# Patient Record
Sex: Female | Born: 1940 | Race: White | Hispanic: No | Marital: Married | Smoking: Former smoker
Health system: Southern US, Community
[De-identification: ages and names within clinical notes are randomized; demographics above are authoritative.]

## PROBLEM LIST (undated history)

## (undated) DIAGNOSIS — T4145XA Adverse effect of unspecified anesthetic, initial encounter: Secondary | ICD-10-CM

## (undated) DIAGNOSIS — Z86010 Personal history of colonic polyps: Secondary | ICD-10-CM

## (undated) DIAGNOSIS — G4736 Sleep related hypoventilation in conditions classified elsewhere: Secondary | ICD-10-CM

## (undated) DIAGNOSIS — IMO0001 Reserved for inherently not codable concepts without codable children: Secondary | ICD-10-CM

## (undated) DIAGNOSIS — K219 Gastro-esophageal reflux disease without esophagitis: Secondary | ICD-10-CM

## (undated) DIAGNOSIS — Z5189 Encounter for other specified aftercare: Secondary | ICD-10-CM

## (undated) DIAGNOSIS — J989 Respiratory disorder, unspecified: Secondary | ICD-10-CM

## (undated) DIAGNOSIS — Z9981 Dependence on supplemental oxygen: Secondary | ICD-10-CM

## (undated) DIAGNOSIS — C801 Malignant (primary) neoplasm, unspecified: Secondary | ICD-10-CM

## (undated) DIAGNOSIS — R51 Headache: Secondary | ICD-10-CM

## (undated) DIAGNOSIS — T8859XA Other complications of anesthesia, initial encounter: Secondary | ICD-10-CM

## (undated) DIAGNOSIS — M419 Scoliosis, unspecified: Secondary | ICD-10-CM

## (undated) DIAGNOSIS — T849XXA Unspecified complication of internal orthopedic prosthetic device, implant and graft, initial encounter: Secondary | ICD-10-CM

## (undated) DIAGNOSIS — I10 Essential (primary) hypertension: Secondary | ICD-10-CM

## (undated) DIAGNOSIS — J189 Pneumonia, unspecified organism: Secondary | ICD-10-CM

## (undated) DIAGNOSIS — D563 Thalassemia minor: Secondary | ICD-10-CM

## (undated) DIAGNOSIS — Z9889 Other specified postprocedural states: Secondary | ICD-10-CM

## (undated) DIAGNOSIS — G4730 Sleep apnea, unspecified: Secondary | ICD-10-CM

## (undated) DIAGNOSIS — M1990 Unspecified osteoarthritis, unspecified site: Secondary | ICD-10-CM

## (undated) HISTORY — PX: DILATION AND CURETTAGE OF UTERUS: SHX78

## (undated) HISTORY — DX: Encounter for other specified aftercare: Z51.89

## (undated) HISTORY — DX: Essential (primary) hypertension: I10

## (undated) HISTORY — DX: Scoliosis, unspecified: M41.9

## (undated) HISTORY — DX: Dependence on supplemental oxygen: Z99.81

## (undated) HISTORY — DX: Unspecified osteoarthritis, unspecified site: M19.90

## (undated) HISTORY — DX: Personal history of colonic polyps: Z86.010

## (undated) HISTORY — DX: Respiratory disorder, unspecified: J98.9

## (undated) HISTORY — DX: Unspecified complication of internal orthopedic prosthetic device, implant and graft, initial encounter: T84.9XXA

## (undated) HISTORY — DX: Other specified postprocedural states: Z98.890

## (undated) HISTORY — DX: Gastro-esophageal reflux disease without esophagitis: K21.9

## (undated) HISTORY — DX: Sleep related hypoventilation in conditions classified elsewhere: G47.36

## (undated) HISTORY — DX: Sleep apnea, unspecified: G47.30

## (undated) HISTORY — DX: Malignant (primary) neoplasm, unspecified: C80.1

## (undated) HISTORY — DX: Reserved for inherently not codable concepts without codable children: IMO0001

## (undated) HISTORY — DX: Thalassemia minor: D56.3

---

## 1985-08-09 HISTORY — PX: OTHER SURGICAL HISTORY: SHX169

## 1993-08-09 HISTORY — PX: BREAST BIOPSY: SHX20

## 1998-06-24 ENCOUNTER — Other Ambulatory Visit: Admission: RE | Admit: 1998-06-24 | Discharge: 1998-06-24 | Payer: Self-pay | Admitting: Gynecology

## 1998-06-25 ENCOUNTER — Other Ambulatory Visit: Admission: RE | Admit: 1998-06-25 | Discharge: 1998-06-25 | Payer: Self-pay | Admitting: Obstetrics & Gynecology

## 1998-08-29 ENCOUNTER — Ambulatory Visit (HOSPITAL_COMMUNITY): Admission: RE | Admit: 1998-08-29 | Discharge: 1998-08-29 | Payer: Self-pay | Admitting: Obstetrics & Gynecology

## 1999-07-08 ENCOUNTER — Other Ambulatory Visit: Admission: RE | Admit: 1999-07-08 | Discharge: 1999-07-08 | Payer: Self-pay | Admitting: Obstetrics & Gynecology

## 2000-07-18 ENCOUNTER — Other Ambulatory Visit: Admission: RE | Admit: 2000-07-18 | Discharge: 2000-07-18 | Payer: Self-pay | Admitting: Obstetrics & Gynecology

## 2001-06-09 ENCOUNTER — Encounter: Payer: Self-pay | Admitting: Obstetrics & Gynecology

## 2001-06-09 ENCOUNTER — Encounter: Admission: RE | Admit: 2001-06-09 | Discharge: 2001-06-09 | Payer: Self-pay | Admitting: Obstetrics & Gynecology

## 2001-12-18 ENCOUNTER — Other Ambulatory Visit: Admission: RE | Admit: 2001-12-18 | Discharge: 2001-12-18 | Payer: Self-pay | Admitting: Obstetrics & Gynecology

## 2002-03-07 ENCOUNTER — Encounter: Payer: Self-pay | Admitting: Obstetrics & Gynecology

## 2002-03-07 ENCOUNTER — Encounter: Admission: RE | Admit: 2002-03-07 | Discharge: 2002-03-07 | Payer: Self-pay | Admitting: Obstetrics & Gynecology

## 2002-10-18 ENCOUNTER — Encounter: Payer: Self-pay | Admitting: Obstetrics & Gynecology

## 2002-10-18 ENCOUNTER — Encounter: Admission: RE | Admit: 2002-10-18 | Discharge: 2002-10-18 | Payer: Self-pay | Admitting: Obstetrics & Gynecology

## 2002-10-19 ENCOUNTER — Encounter: Payer: Self-pay | Admitting: Obstetrics & Gynecology

## 2002-10-19 ENCOUNTER — Encounter: Admission: RE | Admit: 2002-10-19 | Discharge: 2002-10-19 | Payer: Self-pay | Admitting: Obstetrics & Gynecology

## 2003-03-13 ENCOUNTER — Other Ambulatory Visit: Admission: RE | Admit: 2003-03-13 | Discharge: 2003-03-13 | Payer: Self-pay | Admitting: Obstetrics & Gynecology

## 2004-01-07 ENCOUNTER — Encounter: Admission: RE | Admit: 2004-01-07 | Discharge: 2004-01-07 | Payer: Self-pay | Admitting: Obstetrics & Gynecology

## 2004-06-09 ENCOUNTER — Ambulatory Visit: Payer: Self-pay | Admitting: Internal Medicine

## 2004-06-23 ENCOUNTER — Ambulatory Visit: Payer: Self-pay | Admitting: Internal Medicine

## 2004-06-25 ENCOUNTER — Ambulatory Visit: Payer: Self-pay | Admitting: Family Medicine

## 2004-08-09 HISTORY — PX: CATARACT EXTRACTION, BILATERAL: SHX1313

## 2004-09-14 ENCOUNTER — Other Ambulatory Visit: Admission: RE | Admit: 2004-09-14 | Discharge: 2004-09-14 | Payer: Self-pay | Admitting: Obstetrics & Gynecology

## 2004-10-16 ENCOUNTER — Ambulatory Visit: Payer: Self-pay | Admitting: Family Medicine

## 2004-11-10 ENCOUNTER — Ambulatory Visit: Payer: Self-pay | Admitting: Family Medicine

## 2004-11-17 ENCOUNTER — Ambulatory Visit: Payer: Self-pay | Admitting: Family Medicine

## 2004-12-04 ENCOUNTER — Ambulatory Visit: Payer: Self-pay | Admitting: Family Medicine

## 2005-03-04 ENCOUNTER — Encounter: Admission: RE | Admit: 2005-03-04 | Discharge: 2005-03-04 | Payer: Self-pay | Admitting: Obstetrics & Gynecology

## 2005-04-23 ENCOUNTER — Ambulatory Visit: Payer: Self-pay | Admitting: Family Medicine

## 2005-06-12 ENCOUNTER — Ambulatory Visit: Payer: Self-pay | Admitting: Family Medicine

## 2005-07-30 ENCOUNTER — Ambulatory Visit: Payer: Self-pay | Admitting: Internal Medicine

## 2005-08-09 DIAGNOSIS — C801 Malignant (primary) neoplasm, unspecified: Secondary | ICD-10-CM

## 2005-08-09 HISTORY — DX: Malignant (primary) neoplasm, unspecified: C80.1

## 2005-08-09 HISTORY — PX: MASTECTOMY: SHX3

## 2005-11-04 ENCOUNTER — Encounter: Admission: RE | Admit: 2005-11-04 | Discharge: 2005-11-04 | Payer: Self-pay | Admitting: Obstetrics & Gynecology

## 2005-11-08 ENCOUNTER — Encounter (INDEPENDENT_AMBULATORY_CARE_PROVIDER_SITE_OTHER): Payer: Self-pay | Admitting: *Deleted

## 2005-11-08 ENCOUNTER — Encounter (INDEPENDENT_AMBULATORY_CARE_PROVIDER_SITE_OTHER): Payer: Self-pay | Admitting: Diagnostic Radiology

## 2005-11-08 ENCOUNTER — Encounter: Admission: RE | Admit: 2005-11-08 | Discharge: 2005-11-08 | Payer: Self-pay | Admitting: Obstetrics & Gynecology

## 2005-11-15 ENCOUNTER — Encounter: Admission: RE | Admit: 2005-11-15 | Discharge: 2005-11-15 | Payer: Self-pay | Admitting: Surgery

## 2005-11-16 ENCOUNTER — Encounter (INDEPENDENT_AMBULATORY_CARE_PROVIDER_SITE_OTHER): Payer: Self-pay | Admitting: Specialist

## 2005-11-16 ENCOUNTER — Encounter: Admission: RE | Admit: 2005-11-16 | Discharge: 2005-11-16 | Payer: Self-pay | Admitting: Obstetrics & Gynecology

## 2005-11-16 ENCOUNTER — Encounter (INDEPENDENT_AMBULATORY_CARE_PROVIDER_SITE_OTHER): Payer: Self-pay | Admitting: Diagnostic Radiology

## 2005-11-29 ENCOUNTER — Encounter (INDEPENDENT_AMBULATORY_CARE_PROVIDER_SITE_OTHER): Payer: Self-pay | Admitting: Surgery

## 2005-11-29 ENCOUNTER — Encounter (INDEPENDENT_AMBULATORY_CARE_PROVIDER_SITE_OTHER): Payer: Self-pay | Admitting: *Deleted

## 2005-11-29 ENCOUNTER — Inpatient Hospital Stay (HOSPITAL_COMMUNITY): Admission: RE | Admit: 2005-11-29 | Discharge: 2005-12-01 | Payer: Self-pay | Admitting: Surgery

## 2005-12-23 ENCOUNTER — Ambulatory Visit: Payer: Self-pay | Admitting: Oncology

## 2005-12-29 LAB — COMPREHENSIVE METABOLIC PANEL
ALT: 8 U/L (ref 0–40)
AST: 11 U/L (ref 0–37)
Albumin: 4.1 g/dL (ref 3.5–5.2)
Alkaline Phosphatase: 77 U/L (ref 39–117)
BUN: 20 mg/dL (ref 6–23)
Calcium: 9.5 mg/dL (ref 8.4–10.5)
Chloride: 106 mEq/L (ref 96–112)
Potassium: 3.9 mEq/L (ref 3.5–5.3)
Sodium: 141 mEq/L (ref 135–145)
Total Protein: 6.7 g/dL (ref 6.0–8.3)

## 2005-12-29 LAB — CBC WITH DIFFERENTIAL/PLATELET
Eosinophils Absolute: 0.3 10*3/uL (ref 0.0–0.5)
HCT: 29.1 % — ABNORMAL LOW (ref 34.8–46.6)
LYMPH%: 31.3 % (ref 14.0–48.0)
MCV: 64.9 fL — ABNORMAL LOW (ref 81.0–101.0)
MONO#: 0.6 10*3/uL (ref 0.1–0.9)
MONO%: 8.9 % (ref 0.0–13.0)
NEUT#: 3.8 10*3/uL (ref 1.5–6.5)
NEUT%: 54.9 % (ref 39.6–76.8)
Platelets: 377 10*3/uL (ref 145–400)
WBC: 6.9 10*3/uL (ref 3.9–10.0)

## 2006-03-23 ENCOUNTER — Ambulatory Visit: Payer: Self-pay | Admitting: Oncology

## 2006-03-25 LAB — CBC WITH DIFFERENTIAL/PLATELET
BASO%: 0.9 % (ref 0.0–2.0)
Basophils Absolute: 0.1 10*3/uL (ref 0.0–0.1)
EOS%: 4.7 % (ref 0.0–7.0)
MCH: 19.1 pg — ABNORMAL LOW (ref 26.0–34.0)
MCHC: 31.1 g/dL — ABNORMAL LOW (ref 32.0–36.0)
MCV: 61.5 fL — ABNORMAL LOW (ref 81.0–101.0)
MONO%: 5.4 % (ref 0.0–13.0)
RDW: 19.5 % — ABNORMAL HIGH (ref 11.3–14.5)
lymph#: 2 10*3/uL (ref 0.9–3.3)

## 2006-03-25 LAB — COMPREHENSIVE METABOLIC PANEL
ALT: 19 U/L (ref 0–40)
AST: 17 U/L (ref 0–37)
Albumin: 4.5 g/dL (ref 3.5–5.2)
Alkaline Phosphatase: 90 U/L (ref 39–117)
BUN: 18 mg/dL (ref 6–23)
Calcium: 10.2 mg/dL (ref 8.4–10.5)
Chloride: 104 mEq/L (ref 96–112)
Creatinine, Ser: 1.1 mg/dL (ref 0.40–1.20)
Potassium: 3.8 mEq/L (ref 3.5–5.3)

## 2006-06-14 ENCOUNTER — Ambulatory Visit: Payer: Self-pay | Admitting: Family Medicine

## 2006-06-24 ENCOUNTER — Ambulatory Visit: Payer: Self-pay | Admitting: Internal Medicine

## 2006-06-29 ENCOUNTER — Ambulatory Visit: Payer: Self-pay | Admitting: Family Medicine

## 2006-07-04 ENCOUNTER — Ambulatory Visit: Payer: Self-pay | Admitting: Family Medicine

## 2006-08-05 ENCOUNTER — Ambulatory Visit: Payer: Self-pay | Admitting: Family Medicine

## 2006-08-08 ENCOUNTER — Ambulatory Visit: Payer: Self-pay | Admitting: Oncology

## 2006-08-12 LAB — CBC WITH DIFFERENTIAL/PLATELET
EOS%: 2.2 % (ref 0.0–7.0)
Eosinophils Absolute: 0.2 10*3/uL (ref 0.0–0.5)
LYMPH%: 18.7 % (ref 14.0–48.0)
MCH: 20.9 pg — ABNORMAL LOW (ref 26.0–34.0)
MCV: 67.2 fL — ABNORMAL LOW (ref 81.0–101.0)
MONO%: 6.9 % (ref 0.0–13.0)
NEUT#: 6.6 10*3/uL — ABNORMAL HIGH (ref 1.5–6.5)
Platelets: 332 10*3/uL (ref 145–400)
RBC: 5.99 10*6/uL — ABNORMAL HIGH (ref 3.70–5.32)
RDW: 15.7 % — ABNORMAL HIGH (ref 11.3–14.5)

## 2006-08-12 LAB — COMPREHENSIVE METABOLIC PANEL
AST: 14 U/L (ref 0–37)
Alkaline Phosphatase: 93 U/L (ref 39–117)
BUN: 14 mg/dL (ref 6–23)
Glucose, Bld: 109 mg/dL — ABNORMAL HIGH (ref 70–99)
Potassium: 3.5 mEq/L (ref 3.5–5.3)
Sodium: 140 mEq/L (ref 135–145)
Total Bilirubin: 0.6 mg/dL (ref 0.3–1.2)
Total Protein: 7 g/dL (ref 6.0–8.3)

## 2006-09-01 ENCOUNTER — Ambulatory Visit: Payer: Self-pay | Admitting: Family Medicine

## 2006-09-01 LAB — CONVERTED CEMR LAB
Cholesterol: 208 mg/dL (ref 0–200)
Direct LDL: 147 mg/dL

## 2006-09-20 ENCOUNTER — Ambulatory Visit: Payer: Self-pay | Admitting: Family Medicine

## 2006-11-21 ENCOUNTER — Encounter: Admission: RE | Admit: 2006-11-21 | Discharge: 2006-11-21 | Payer: Self-pay | Admitting: Oncology

## 2006-12-06 ENCOUNTER — Ambulatory Visit: Payer: Self-pay | Admitting: Family Medicine

## 2007-02-21 ENCOUNTER — Ambulatory Visit: Payer: Self-pay | Admitting: Oncology

## 2007-02-23 LAB — CBC WITH DIFFERENTIAL/PLATELET
Basophils Absolute: 0.1 10*3/uL (ref 0.0–0.1)
EOS%: 4.1 % (ref 0.0–7.0)
HGB: 11.7 g/dL (ref 11.6–15.9)
MCH: 20.9 pg — ABNORMAL LOW (ref 26.0–34.0)
MCHC: 32.3 g/dL (ref 32.0–36.0)
MCV: 64.7 fL — ABNORMAL LOW (ref 81.0–101.0)
MONO%: 7.3 % (ref 0.0–13.0)
NEUT%: 62.4 % (ref 39.6–76.8)
RDW: 15.1 % — ABNORMAL HIGH (ref 11.3–14.5)

## 2007-02-23 LAB — COMPREHENSIVE METABOLIC PANEL
AST: 15 U/L (ref 0–37)
Alkaline Phosphatase: 73 U/L (ref 39–117)
BUN: 22 mg/dL (ref 6–23)
Creatinine, Ser: 1.12 mg/dL (ref 0.40–1.20)
Potassium: 4 mEq/L (ref 3.5–5.3)

## 2007-03-02 ENCOUNTER — Ambulatory Visit (HOSPITAL_COMMUNITY): Admission: RE | Admit: 2007-03-02 | Discharge: 2007-03-02 | Payer: Self-pay | Admitting: Oncology

## 2007-05-15 DIAGNOSIS — M76829 Posterior tibial tendinitis, unspecified leg: Secondary | ICD-10-CM

## 2007-05-25 ENCOUNTER — Ambulatory Visit: Payer: Self-pay | Admitting: Family Medicine

## 2007-08-15 ENCOUNTER — Telehealth: Payer: Self-pay | Admitting: Family Medicine

## 2007-08-17 ENCOUNTER — Ambulatory Visit: Payer: Self-pay | Admitting: Family Medicine

## 2007-08-17 DIAGNOSIS — E0590 Thyrotoxicosis, unspecified without thyrotoxic crisis or storm: Secondary | ICD-10-CM | POA: Insufficient documentation

## 2007-08-17 DIAGNOSIS — Z860100 Personal history of colon polyps, unspecified: Secondary | ICD-10-CM

## 2007-08-17 DIAGNOSIS — I10 Essential (primary) hypertension: Secondary | ICD-10-CM | POA: Insufficient documentation

## 2007-08-17 DIAGNOSIS — F329 Major depressive disorder, single episode, unspecified: Secondary | ICD-10-CM

## 2007-08-17 DIAGNOSIS — J45909 Unspecified asthma, uncomplicated: Secondary | ICD-10-CM | POA: Insufficient documentation

## 2007-08-17 DIAGNOSIS — J309 Allergic rhinitis, unspecified: Secondary | ICD-10-CM | POA: Insufficient documentation

## 2007-08-17 DIAGNOSIS — K219 Gastro-esophageal reflux disease without esophagitis: Secondary | ICD-10-CM

## 2007-08-17 HISTORY — DX: Personal history of colonic polyps: Z86.010

## 2007-08-17 HISTORY — DX: Personal history of colon polyps, unspecified: Z86.0100

## 2007-08-21 ENCOUNTER — Encounter: Payer: Self-pay | Admitting: Family Medicine

## 2007-08-22 ENCOUNTER — Ambulatory Visit: Payer: Self-pay | Admitting: Oncology

## 2007-08-24 LAB — CBC WITH DIFFERENTIAL/PLATELET
Basophils Absolute: 0 10*3/uL (ref 0.0–0.1)
Eosinophils Absolute: 0.1 10*3/uL (ref 0.0–0.5)
HGB: 12.8 g/dL (ref 11.6–15.9)
LYMPH%: 8.4 % — ABNORMAL LOW (ref 14.0–48.0)
MCV: 65.8 fL — ABNORMAL LOW (ref 81.0–101.0)
MONO%: 1.6 % (ref 0.0–13.0)
NEUT#: 13.8 10*3/uL — ABNORMAL HIGH (ref 1.5–6.5)
Platelets: 335 10*3/uL (ref 145–400)

## 2007-08-24 LAB — COMPREHENSIVE METABOLIC PANEL
Albumin: 4.4 g/dL (ref 3.5–5.2)
Alkaline Phosphatase: 64 U/L (ref 39–117)
BUN: 26 mg/dL — ABNORMAL HIGH (ref 6–23)
Glucose, Bld: 153 mg/dL — ABNORMAL HIGH (ref 70–99)
Total Bilirubin: 0.5 mg/dL (ref 0.3–1.2)

## 2007-09-01 LAB — VITAMIN D PNL(25-HYDRXY+1,25-DIHY)-BLD: Vit D, 25-Hydroxy: 31 ng/mL (ref 30–89)

## 2007-10-20 ENCOUNTER — Ambulatory Visit: Payer: Self-pay | Admitting: Oncology

## 2007-11-02 ENCOUNTER — Ambulatory Visit: Payer: Self-pay | Admitting: Internal Medicine

## 2007-11-13 ENCOUNTER — Encounter: Payer: Self-pay | Admitting: Internal Medicine

## 2007-11-13 ENCOUNTER — Ambulatory Visit: Payer: Self-pay | Admitting: Internal Medicine

## 2007-11-13 ENCOUNTER — Encounter: Payer: Self-pay | Admitting: Family Medicine

## 2007-11-23 ENCOUNTER — Encounter: Admission: RE | Admit: 2007-11-23 | Discharge: 2007-11-23 | Payer: Self-pay | Admitting: Oncology

## 2008-04-26 ENCOUNTER — Telehealth: Payer: Self-pay | Admitting: Family Medicine

## 2008-05-10 ENCOUNTER — Ambulatory Visit: Payer: Self-pay | Admitting: Oncology

## 2008-05-14 LAB — CBC WITH DIFFERENTIAL/PLATELET
BASO%: 1.2 % (ref 0.0–2.0)
EOS%: 3.6 % (ref 0.0–7.0)
LYMPH%: 26.5 % (ref 14.0–48.0)
MCH: 20.8 pg — ABNORMAL LOW (ref 26.0–34.0)
MCHC: 31.8 g/dL — ABNORMAL LOW (ref 32.0–36.0)
MONO#: 0.4 10*3/uL (ref 0.1–0.9)
Platelets: 253 10*3/uL (ref 145–400)
RBC: 6.07 10*6/uL — ABNORMAL HIGH (ref 3.70–5.32)
WBC: 9 10*3/uL (ref 3.9–10.0)

## 2008-05-15 LAB — CANCER ANTIGEN 27.29: CA 27.29: 18 U/mL (ref 0–39)

## 2008-05-20 ENCOUNTER — Encounter: Payer: Self-pay | Admitting: Internal Medicine

## 2008-05-20 ENCOUNTER — Encounter: Payer: Self-pay | Admitting: Cardiology

## 2008-05-23 ENCOUNTER — Ambulatory Visit: Payer: Self-pay | Admitting: Family Medicine

## 2008-08-06 ENCOUNTER — Ambulatory Visit: Payer: Self-pay | Admitting: Family Medicine

## 2008-08-15 ENCOUNTER — Ambulatory Visit: Payer: Self-pay | Admitting: Oncology

## 2008-08-19 LAB — BASIC METABOLIC PANEL
BUN: 17 mg/dL (ref 6–23)
Calcium: 10.4 mg/dL (ref 8.4–10.5)
Glucose, Bld: 110 mg/dL — ABNORMAL HIGH (ref 70–99)
Potassium: 4.1 mEq/L (ref 3.5–5.3)

## 2008-09-17 ENCOUNTER — Ambulatory Visit: Payer: Self-pay | Admitting: Family Medicine

## 2008-09-17 DIAGNOSIS — G609 Hereditary and idiopathic neuropathy, unspecified: Secondary | ICD-10-CM | POA: Insufficient documentation

## 2008-10-01 ENCOUNTER — Ambulatory Visit: Payer: Self-pay | Admitting: Family Medicine

## 2008-12-25 ENCOUNTER — Encounter: Admission: RE | Admit: 2008-12-25 | Discharge: 2008-12-25 | Payer: Self-pay | Admitting: Oncology

## 2009-01-11 IMAGING — CR DG THORACIC SPINE 2V
3 series · 3 of 3 positions shown · non-contrast
Comparison: Chest radiograph 11/25/05.

CLINICAL DATA: 66-year-old female evaluate for compression fracture.  Patient has pain throughout the mid portion of her back extending to both ribs. 
 THORACIC SPINE ? 2 VIEW:

[t t-spine lat (1 of 3)]
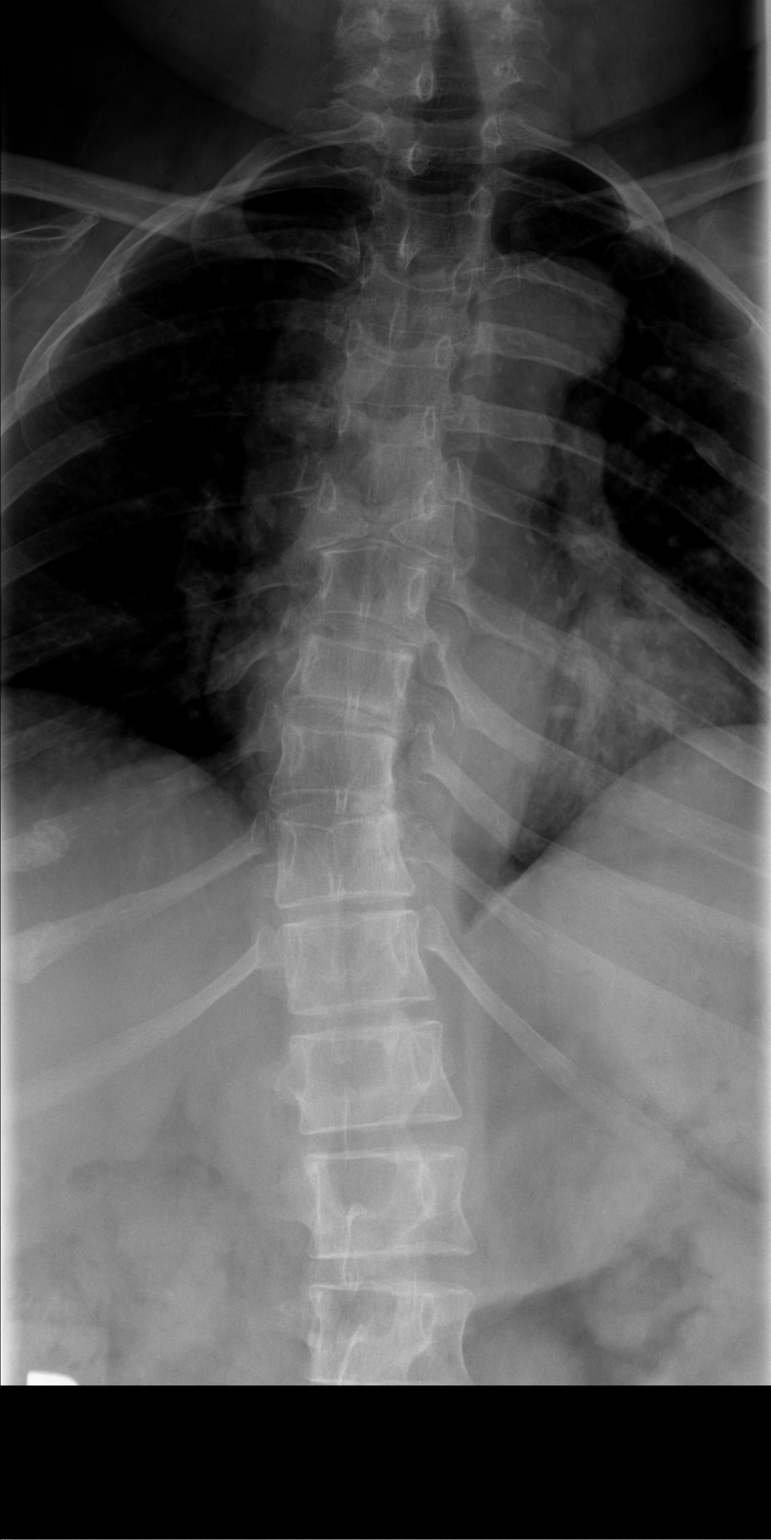

[t t-spine lat (2 of 3)]
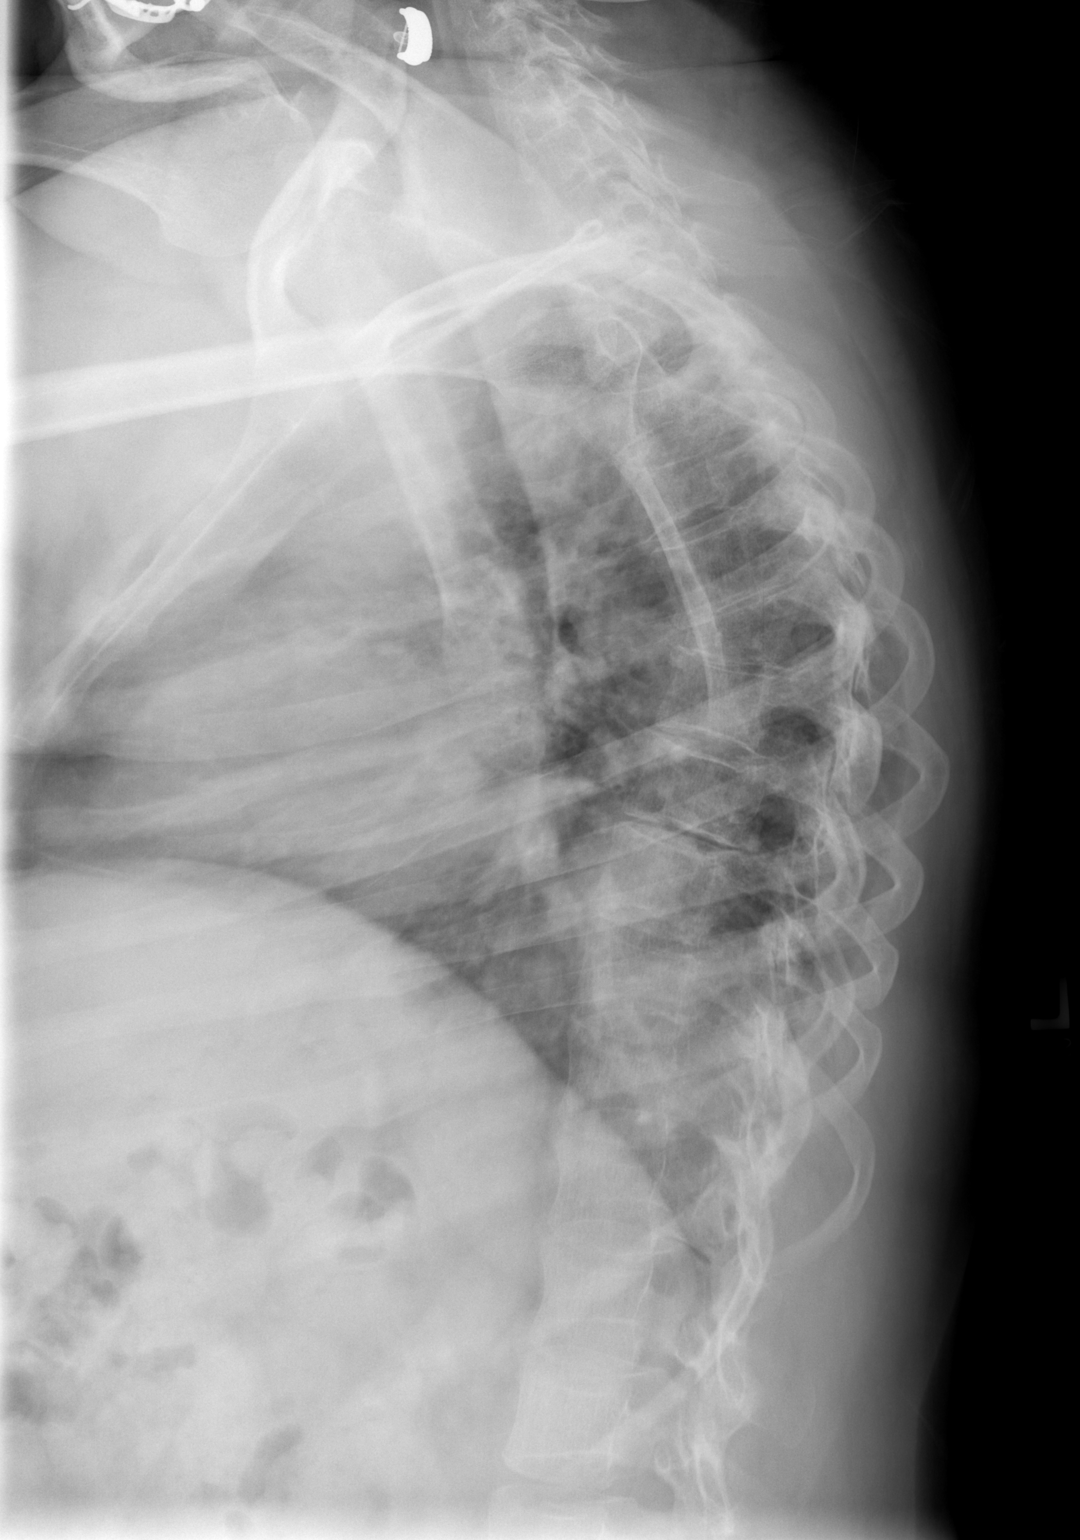

[t t-spine lat (3 of 3)]
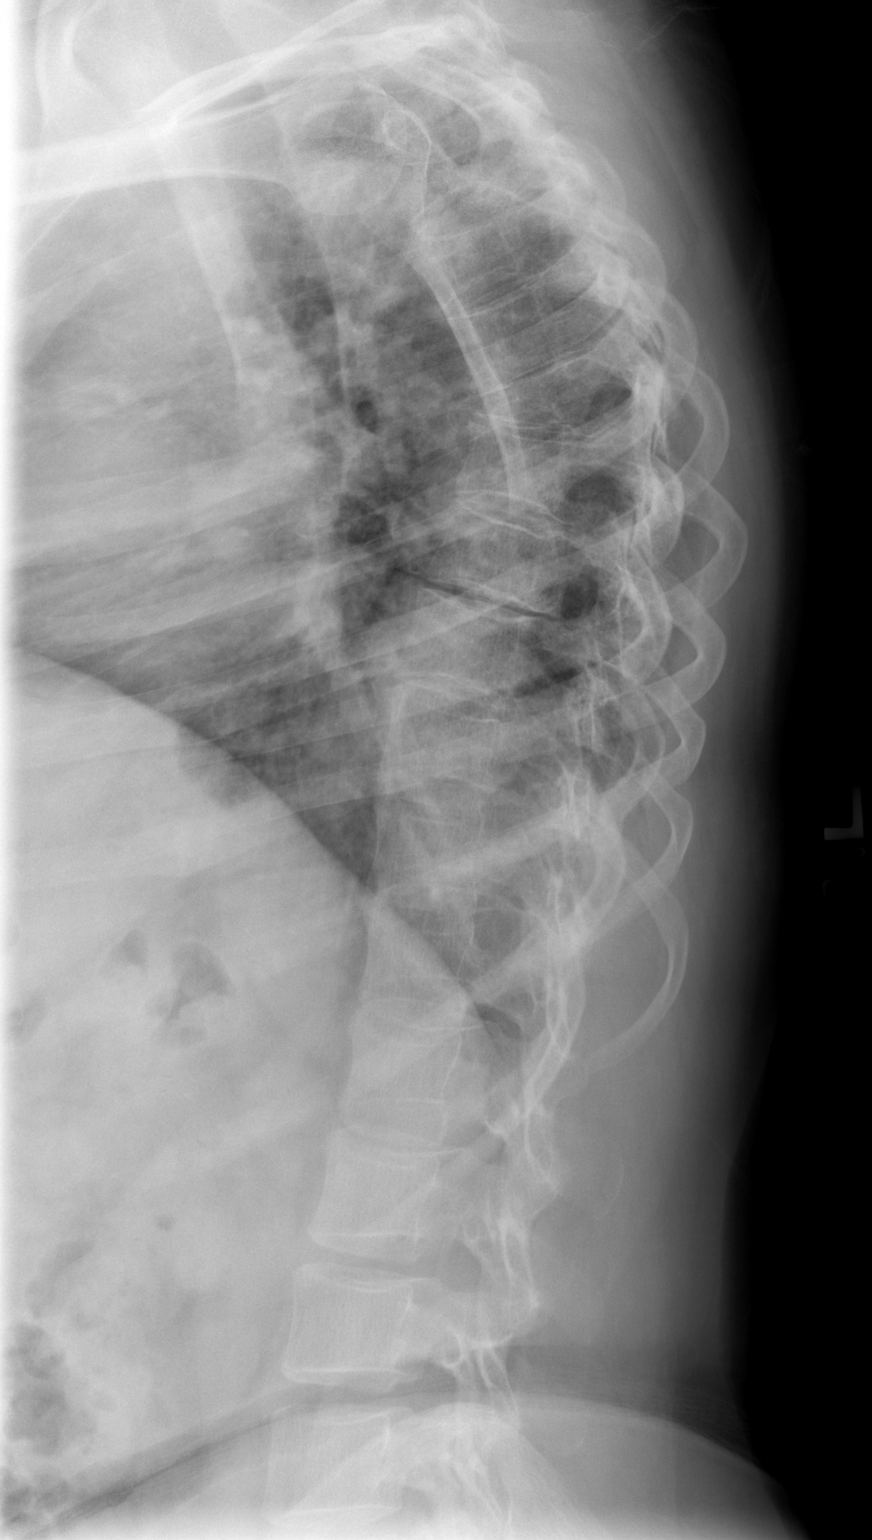

[3 of 3 positions shown; findings below may reference images not displayed]

FINDINGS: Eleven rib-bearing vertebral bodies are present.  There is a butterfly anomaly at T6.  There is rightward curvature apex at T9.  Compensatory curvature is seen in the upper lumbar spine.  Sclerotic lesions are seen in the right posterior ninth and tenth ribs compatible with healing fractures.  These could represent metastases.  There is a healed fracture in the posterior left third rib.  Mild sclerosis is seen along the concave aspect of the curvature at T9-10.  No new fractures are seen.
IMPRESSION: 1.  Segmented vertebral body (butterfly vertebral body) T6 with exaggerated kyphosis similar to the prior chest radiograph. 
 2.  Rightward curvature of the thoracic spine centered at T9-10.  
 3.  Sclerotic lesions suggesting healing fractures in the posterior right ninth and tenth ribs.  
 4.  Healed left posterior third rib fracture. 
 5.  Metastases are not excluded.

## 2009-03-01 ENCOUNTER — Encounter: Payer: Self-pay | Admitting: Cardiology

## 2009-03-13 ENCOUNTER — Ambulatory Visit: Payer: Self-pay | Admitting: Oncology

## 2009-03-13 LAB — CBC WITH DIFFERENTIAL/PLATELET
BASO%: 0.3 % (ref 0.0–2.0)
HCT: 41.7 % (ref 34.8–46.6)
LYMPH%: 12.4 % — ABNORMAL LOW (ref 14.0–49.7)
MCHC: 31.5 g/dL (ref 31.5–36.0)
MONO#: 0.2 10*3/uL (ref 0.1–0.9)
NEUT%: 84.6 % — ABNORMAL HIGH (ref 38.4–76.8)
Platelets: 270 10*3/uL (ref 145–400)
WBC: 15.1 10*3/uL — ABNORMAL HIGH (ref 3.9–10.3)

## 2009-03-13 LAB — COMPREHENSIVE METABOLIC PANEL
ALT: 13 U/L (ref 0–35)
CO2: 27 mEq/L (ref 19–32)
Creatinine, Ser: 1.25 mg/dL — ABNORMAL HIGH (ref 0.40–1.20)
Glucose, Bld: 140 mg/dL — ABNORMAL HIGH (ref 70–99)
Total Bilirubin: 0.5 mg/dL (ref 0.3–1.2)

## 2009-03-20 ENCOUNTER — Encounter: Payer: Self-pay | Admitting: Cardiology

## 2009-03-27 ENCOUNTER — Telehealth: Payer: Self-pay | Admitting: Family Medicine

## 2009-04-02 ENCOUNTER — Ambulatory Visit: Payer: Self-pay | Admitting: Cardiology

## 2009-04-21 ENCOUNTER — Telehealth (INDEPENDENT_AMBULATORY_CARE_PROVIDER_SITE_OTHER): Payer: Self-pay | Admitting: Radiology

## 2009-04-22 ENCOUNTER — Ambulatory Visit: Payer: Self-pay

## 2009-04-22 ENCOUNTER — Encounter: Payer: Self-pay | Admitting: Cardiology

## 2009-04-24 ENCOUNTER — Encounter (INDEPENDENT_AMBULATORY_CARE_PROVIDER_SITE_OTHER): Payer: Self-pay | Admitting: *Deleted

## 2009-04-24 ENCOUNTER — Ambulatory Visit: Payer: Self-pay | Admitting: Cardiology

## 2009-04-24 DIAGNOSIS — R0789 Other chest pain: Secondary | ICD-10-CM

## 2009-04-25 LAB — CONVERTED CEMR LAB
BUN: 20 mg/dL (ref 6–23)
Basophils Relative: 0.8 % (ref 0.0–3.0)
CO2: 30 meq/L (ref 19–32)
Chloride: 109 meq/L (ref 96–112)
Eosinophils Absolute: 0.6 10*3/uL (ref 0.0–0.7)
Eosinophils Relative: 6.4 % — ABNORMAL HIGH (ref 0.0–5.0)
Lymphocytes Relative: 22.8 % (ref 12.0–46.0)
Lymphs Abs: 2 10*3/uL (ref 0.7–4.0)
Monocytes Absolute: 0.6 10*3/uL (ref 0.1–1.0)
Platelets: 265 10*3/uL (ref 150.0–400.0)
Potassium: 4.1 meq/L (ref 3.5–5.1)
RBC: 5.71 M/uL — ABNORMAL HIGH (ref 3.87–5.11)
RDW: 14.4 % (ref 11.5–14.6)

## 2009-04-29 ENCOUNTER — Ambulatory Visit: Payer: Self-pay | Admitting: Cardiovascular Disease

## 2009-04-29 ENCOUNTER — Inpatient Hospital Stay (HOSPITAL_BASED_OUTPATIENT_CLINIC_OR_DEPARTMENT_OTHER): Admission: RE | Admit: 2009-04-29 | Discharge: 2009-04-29 | Payer: Self-pay | Admitting: Cardiovascular Disease

## 2009-05-05 ENCOUNTER — Telehealth: Payer: Self-pay | Admitting: Family Medicine

## 2009-05-18 ENCOUNTER — Encounter
Admission: RE | Admit: 2009-05-18 | Discharge: 2009-05-18 | Payer: Self-pay | Admitting: Physical Medicine and Rehabilitation

## 2009-05-29 ENCOUNTER — Encounter (INDEPENDENT_AMBULATORY_CARE_PROVIDER_SITE_OTHER): Payer: Self-pay | Admitting: *Deleted

## 2009-06-06 ENCOUNTER — Ambulatory Visit: Payer: Self-pay | Admitting: Cardiology

## 2009-08-09 HISTORY — PX: FEMUR FRACTURE SURGERY: SHX633

## 2009-09-17 ENCOUNTER — Ambulatory Visit: Payer: Self-pay | Admitting: Oncology

## 2009-09-19 LAB — COMPREHENSIVE METABOLIC PANEL
AST: 18 U/L (ref 0–37)
Alkaline Phosphatase: 70 U/L (ref 39–117)
BUN: 17 mg/dL (ref 6–23)
Creatinine, Ser: 1.06 mg/dL (ref 0.40–1.20)
Potassium: 3.5 mEq/L (ref 3.5–5.3)

## 2009-09-19 LAB — CBC WITH DIFFERENTIAL/PLATELET
Basophils Absolute: 0 10*3/uL (ref 0.0–0.1)
EOS%: 4.8 % (ref 0.0–7.0)
HGB: 13.8 g/dL (ref 11.6–15.9)
MCH: 20.7 pg — ABNORMAL LOW (ref 25.1–34.0)
MCHC: 30.8 g/dL — ABNORMAL LOW (ref 31.5–36.0)
MCV: 67.2 fL — ABNORMAL LOW (ref 79.5–101.0)
MONO%: 4.7 % (ref 0.0–14.0)
RDW: 15.3 % — ABNORMAL HIGH (ref 11.2–14.5)

## 2009-10-13 ENCOUNTER — Encounter (INDEPENDENT_AMBULATORY_CARE_PROVIDER_SITE_OTHER): Payer: Self-pay | Admitting: *Deleted

## 2009-12-12 ENCOUNTER — Telehealth: Payer: Self-pay | Admitting: Family Medicine

## 2009-12-15 ENCOUNTER — Telehealth: Payer: Self-pay | Admitting: Family Medicine

## 2009-12-23 ENCOUNTER — Ambulatory Visit: Payer: Self-pay | Admitting: Family Medicine

## 2010-01-01 ENCOUNTER — Encounter: Admission: RE | Admit: 2010-01-01 | Discharge: 2010-01-01 | Payer: Self-pay | Admitting: Oncology

## 2010-01-16 ENCOUNTER — Inpatient Hospital Stay (HOSPITAL_COMMUNITY): Admission: EM | Admit: 2010-01-16 | Discharge: 2010-01-20 | Payer: Self-pay | Admitting: Emergency Medicine

## 2010-01-19 ENCOUNTER — Ambulatory Visit: Payer: Self-pay | Admitting: Physical Medicine & Rehabilitation

## 2010-01-20 ENCOUNTER — Inpatient Hospital Stay (HOSPITAL_COMMUNITY)
Admission: RE | Admit: 2010-01-20 | Discharge: 2010-01-28 | Payer: Self-pay | Admitting: Physical Medicine & Rehabilitation

## 2010-03-09 ENCOUNTER — Inpatient Hospital Stay (HOSPITAL_COMMUNITY): Admission: EM | Admit: 2010-03-09 | Discharge: 2010-03-10 | Payer: Self-pay | Admitting: Emergency Medicine

## 2010-03-10 ENCOUNTER — Ambulatory Visit (HOSPITAL_BASED_OUTPATIENT_CLINIC_OR_DEPARTMENT_OTHER): Payer: Medicare Other | Admitting: Oncology

## 2010-03-12 LAB — CBC WITH DIFFERENTIAL/PLATELET
BASO%: 0.9 % (ref 0.0–2.0)
Eosinophils Absolute: 0.5 10*3/uL (ref 0.0–0.5)
MCHC: 31.7 g/dL (ref 31.5–36.0)
MONO#: 0.7 10*3/uL (ref 0.1–0.9)
NEUT#: 5.7 10*3/uL (ref 1.5–6.5)
RBC: 5.6 10*6/uL — ABNORMAL HIGH (ref 3.70–5.45)
RDW: 15.6 % — ABNORMAL HIGH (ref 11.2–14.5)
WBC: 9.2 10*3/uL (ref 3.9–10.3)
lymph#: 2.2 10*3/uL (ref 0.9–3.3)

## 2010-03-12 LAB — COMPREHENSIVE METABOLIC PANEL
ALT: 13 U/L (ref 0–35)
Albumin: 3.9 g/dL (ref 3.5–5.2)
CO2: 31 mEq/L (ref 19–32)
Calcium: 9.6 mg/dL (ref 8.4–10.5)
Chloride: 102 mEq/L (ref 96–112)
Potassium: 3.9 mEq/L (ref 3.5–5.3)
Sodium: 140 mEq/L (ref 135–145)
Total Bilirubin: 0.7 mg/dL (ref 0.3–1.2)
Total Protein: 6.6 g/dL (ref 6.0–8.3)

## 2010-03-13 LAB — CANCER ANTIGEN 27.29: CA 27.29: <4 U/mL (ref 0–39)

## 2010-08-30 ENCOUNTER — Encounter: Payer: Self-pay | Admitting: Oncology

## 2010-09-06 LAB — CONVERTED CEMR LAB
ALT: 21 units/L (ref 0–35)
ALT: 21 units/L (ref 0–35)
AST: 14 units/L (ref 0–37)
AST: 17 units/L (ref 0–37)
AST: 20 units/L (ref 0–37)
Albumin: 4.5 g/dL (ref 3.5–5.2)
Alkaline Phosphatase: 60 units/L (ref 39–117)
Basophils Absolute: 0 10*3/uL (ref 0.0–0.1)
Basophils Absolute: 0 10*3/uL (ref 0.0–0.1)
Basophils Relative: 0.1 % (ref 0.0–1.0)
Bilirubin Urine: NEGATIVE
Blood in Urine, dipstick: NEGATIVE
CO2: 30 meq/L (ref 19–32)
CO2: 30 meq/L (ref 19–32)
Cholesterol: 212 mg/dL — ABNORMAL HIGH (ref 0–200)
Cholesterol: 245 mg/dL (ref 0–200)
Direct LDL: 141 mg/dL
Direct LDL: 155.2 mg/dL
Eosinophils Absolute: 0.3 10*3/uL (ref 0.0–0.7)
Eosinophils Absolute: 0.4 10*3/uL (ref 0.0–0.7)
Eosinophils Relative: 1.9 % (ref 0.0–5.0)
Eosinophils Relative: 3.1 % (ref 0.0–5.0)
Eosinophils Relative: 4.2 % (ref 0.0–5.0)
GFR calc non Af Amer: 48 mL/min
Glucose, Bld: 102 mg/dL — ABNORMAL HIGH (ref 70–99)
Glucose, Bld: 85 mg/dL (ref 70–99)
Glucose, Urine, Semiquant: NEGATIVE
Glucose, Urine, Semiquant: NEGATIVE
HCT: 41.9 % (ref 36.0–46.0)
HDL: 36.3 mg/dL — ABNORMAL LOW (ref >39.0)
HDL: 44.3 mg/dL (ref >39.00)
Hemoglobin: 13.3 g/dL (ref 12.0–15.0)
Lymphocytes Relative: 19 % (ref 12.0–46.0)
Lymphs Abs: 1.7 10*3/uL (ref 0.7–4.0)
MCHC: 31.5 g/dL (ref 30.0–36.0)
MCV: 66.9 fL — ABNORMAL LOW (ref 78.0–100.0)
Monocytes Absolute: 0.5 10*3/uL (ref 0.1–1.0)
Monocytes Absolute: 1.1 10*3/uL — ABNORMAL HIGH (ref 0.2–0.7)
Monocytes Relative: 5.6 % (ref 3.0–12.0)
Monocytes Relative: 5.9 % (ref 3.0–11.0)
Neutro Abs: 6.1 10*3/uL (ref 1.4–7.7)
Neutrophils Relative %: 70.2 % (ref 43.0–77.0)
Neutrophils Relative %: 71 % (ref 43.0–77.0)
Neutrophils Relative %: 75.2 % (ref 43.0–77.0)
Platelets: 226 10*3/uL (ref 150–400)
Potassium: 4.1 meq/L (ref 3.5–5.1)
Potassium: 4.8 meq/L (ref 3.5–5.1)
RBC: 6.22 M/uL — ABNORMAL HIGH (ref 3.87–5.11)
RBC: 6.27 M/uL — ABNORMAL HIGH (ref 3.87–5.11)
RDW: 14.7 % — ABNORMAL HIGH (ref 11.5–14.6)
RDW: 15 % — ABNORMAL HIGH (ref 11.5–14.6)
Sodium: 140 meq/L (ref 135–145)
Specific Gravity, Urine: 1.015
TSH: 1.98 microintl units/mL (ref 0.35–5.50)
TSH: 2.88 microintl units/mL (ref 0.35–5.50)
TSH: 3.34 microintl units/mL (ref 0.35–5.50)
Total Bilirubin: 0.8 mg/dL (ref 0.3–1.2)
Total Bilirubin: 0.9 mg/dL (ref 0.3–1.2)
Total CHOL/HDL Ratio: 5.3
Total Protein: 6.7 g/dL (ref 6.0–8.3)
Total Protein: 6.9 g/dL (ref 6.0–8.3)
Total Protein: 7.3 g/dL (ref 6.0–8.3)
Triglycerides: 131 mg/dL (ref 0–149)
Urobilinogen, UA: 0.2
VLDL: 26 mg/dL (ref 0–40)
WBC Urine, dipstick: NEGATIVE
WBC: 10.8 10*3/uL — ABNORMAL HIGH (ref 4.5–10.5)

## 2010-09-10 ENCOUNTER — Ambulatory Visit: Payer: Medicare Other | Admitting: Oncology

## 2010-09-10 DIAGNOSIS — C50919 Malignant neoplasm of unspecified site of unspecified female breast: Secondary | ICD-10-CM

## 2010-09-10 LAB — CBC WITH DIFFERENTIAL/PLATELET
BASO%: 0.1 % (ref 0.0–2.0)
Basophils Absolute: 0 10*3/uL (ref 0.0–0.1)
EOS%: 4 % (ref 0.0–7.0)
HCT: 41.5 % (ref 34.8–46.6)
HGB: 13 g/dL (ref 11.6–15.9)
LYMPH%: 17.8 % (ref 14.0–49.7)
MCH: 21 pg — ABNORMAL LOW (ref 25.1–34.0)
MCHC: 31.3 g/dL — ABNORMAL LOW (ref 31.5–36.0)
MCV: 67 fL — ABNORMAL LOW (ref 79.5–101.0)
MONO%: 6.1 % (ref 0.0–14.0)
NEUT%: 72 % (ref 38.4–76.8)
Platelets: 270 10*3/uL (ref 145–400)
lymph#: 1.5 10*3/uL (ref 0.9–3.3)

## 2010-09-10 LAB — COMPREHENSIVE METABOLIC PANEL
ALT: 18 U/L (ref 0–35)
AST: 20 U/L (ref 0–37)
BUN: 15 mg/dL (ref 6–23)
Calcium: 10.5 mg/dL (ref 8.4–10.5)
Chloride: 99 mEq/L (ref 96–112)
Creatinine, Ser: 0.97 mg/dL (ref 0.40–1.20)
Total Bilirubin: 0.7 mg/dL (ref 0.3–1.2)

## 2010-09-10 NOTE — Progress Notes (Signed)
Summary: Pt called re: Mirapex 0.5mg . Has cpx in 2 wks  Phone Note Call from Patient Call back at [facility] Phone [phone]   Caller: Patient Summary of Call: Pt called re: Mirapex 0.5mg . Pt is sch for cpx in approx 2 wks and wants to know why refill req was denied? Pt has been on this med for years now. Initial call taken by: Lucy Antigua,  Dec 12, 2009 9:07 AM    Prescriptions: MIRAPEX 0.5 MG  TABS (PRAMIPEXOLE DIHYDROCHLORIDE) Take 1 tablet by mouth once a day  #30 Tablet x 0   Entered by:   Kern Reap CMA (AAMA)   Authorized by:   Roderick Pee MD   Signed by:   Kern Reap CMA (AAMA) on 12/12/2009   Method used:   Elect[facility] to        [facility] [address]  [phone]* (retail)       [address] [place], [place]  [address]       Ph: [phone] or [phone]       Fax: [phone]   RxID:   [id number]

## 2010-09-10 NOTE — Progress Notes (Signed)
Summary: REQ FOR REFILL RX (Furosemide 20 mg / Mirapex 0.5 mg)  Phone Note Refill Request Call back at [phone] Message from:  Patient on Dec 15, 2009 9:39 AM  Refills Requested: Medication #1:  FUROSEMIDE 20 MG  TABS Take 1 tablet by mouth once a day   Notes: Pt [facility] [address].... Pt has appt scheduled for CPX on 12/23/2009 at 10:30am - However, meds will run out before appt date / time.  Medication #2:  MIRAPEX 0.5 MG  TABS Take 1 tablet by mouth once a day   Notes: Pt [facility] [address].... Pt has appt scheduled for CPX on 12/23/2009 at 10:30am - However, meds will run out before appt date / time. Initial call taken by: Debbra Riding,  Dec 15, 2009 9:39 AM  Follow-up for Phone Call        disregard message per pt. Pharmacy already called pt about her refills. Follow-up by: Warnell Forester,  Dec 15, 2009 9:48 AM

## 2010-09-10 NOTE — Assessment & Plan Note (Signed)
Summary: CPX (PT WILL COME IN FASTING) // RS---PT [facility] // RS   Vital Signs:  Patient profile:   70 year old female Height:      56.0 inches Weight:      158 pounds Temp:     99.0 degrees F oral BP sitting:   130 / 88  (left arm) Cuff size:   regular  Vitals Entered By: Kern Reap CMA Duncan Dull) (Dec 23, 2009 10:36 AM) CC: cpx Is Patient Diabetic? No   Primary Care Provider:  Roderick Pee MD  CC:  cpx.  History of Present Illness: Tracey Nguyen is a 70 year old, married female, nonsmoker, who comes in today for evaluation of reflux esophagitis, hypertension, restless leg syndrome.  The reflux esophagitis is treated with omeprazole 20 mg daily.  Her hypertension is treated with Lasix 20 mg daily, lisinopril, 20 mg daily, BP 130/88.  The restless leg syndrome history with Mirapex .5 daily.  She also sees her oncologist twice yearly, because she's had a right mastectomy for breast cancer.  She is on arm index 1 mg daily and doing well.  She also sees orthopedist on a regular basis because of severe degenerative eye disease and osteoporosis.  She takes zometa  injections every 6 months,undercut and one tablet t.i.d., p.r.n. and tramadol 50 mg b.i.d., p.r.n. for severe pain.  She gets routine eye care, and dental care checks her left breast, monthly, and gets annual mammography.  Colonoscopy normal, and GI except for polyps.  Tetanus 2004 seasonal flu 2010 Pneumovax 2006 shingles 2010  Her past medical history, social history, family history reviewed in detail.  No significant changes.  She is limited as far as physical activity because of pain in her back.  Counseled to walk 10 minutes twice daily outside.  She states her mood is good.  She is on Cymbalta 30 mg daily from neurology because of neuropathy, and it also helps with depression.  Her hearing is normal.  ADLs are much reduced because of her inability walk fall risk was reviewed home safety.  Normal height, weight, unchanged.  She  gets annual eye exams.  She was counseled about taking her medication appropriately following up with the oncologist, and the orthopedist in beginning a walking program.  10 minutes twice daily.  Allergies: 1)  ! Codeine  Past History:  Past medical, surgical, family and social histories (including risk factors) reviewed, and no changes noted (except as noted below).  Past Medical History: Reviewed history from 03/29/2009 and no changes required. CHEST WALL PAIN, ACUTE (ICD-786.52) HYPERTENSION NEC (ICD-997.91) PERIPHERAL NEUROPATHY (ICD-356.9) OSTEOPOROSIS (ICD-733.00) HYPERTHYROIDISM (ICD-242.90) GERD (ICD-530.81) DEPRESSION (ICD-311) COLONIC POLYPS, HX OF (ICD-V12.72) BREAST CANCER, HX OF (ICD-V10.3 R mastectomy 2007 ASTHMA (ICD-493.90) ALLERGIC RHINITIS (ICD-477.9) TENDINITIS, TIBIALIS (ICD-726.72) bilateral cataracts lens implants  Past Surgical History: Reviewed history from 09/17/2008 and no changes required. Lumpectomy  l Mastectomy  r  Family History: Reviewed history from 08/17/2007 and no changes required. father died at 59, had TB, congestive heart failure.  MRN a smoker mother died at 54, dermatomyositis one sister with thalassemia minor  Social History: Reviewed history from 08/17/2007 and no changes required. Occupation: ins. Married Never Smoked Alcohol use-no Drug use-no Regular exercise-no  Review of Systems      See HPI  Physical Exam  General:  Well-developed,well-nourished,in no acute distress; alert,appropriate and cooperative throughout examination Head:  Normocephalic and atraumatic without obvious abnormalities. No apparent alopecia or balding. Eyes:  No corneal or conjunctival inflammation noted. EOMI. Perrla. Funduscopic  exam benign, without hemorrhages, exudates or papilledema. Vision grossly normal. Ears:  External ear exam shows no significant lesions or deformities.  Otoscopic examination reveals clear canals, tympanic membranes  are intact bilaterally without bulging, retraction, inflammation or discharge. Hearing is grossly normal bilaterally. Nose:  External nasal examination shows no deformity or inflammation. Nasal mucosa are pink and moist without lesions or exudates. Mouth:  Oral mucosa and oropharynx without lesions or exudates.  Teeth in good repair. Neck:  Neck supple, no JVD. No masses, thyromegaly or abnormal cervical nodes. Chest Wall:  No deformities, masses, or tenderness noted. Breasts:  right breast surgically removed.  Left breast normal scar at the 12 o'clock position from previous biopsy.  Well-healed Lungs:  Normal respiratory effort, chest expands symmetrically. Lungs are clear to auscultation, no crackles or wheezes. Heart:  Normal rate and regular rhythm. S1 and S2 normal without gallop, murmur, click, rub or other extra sounds. Abdomen:  obese no palpable masses Msk:  marked scoliosis of the thoracic and the lumbar spine Pulses:  R and L carotid,radial,femoral,dorsalis pedis and posterior tibial pulses are full and equal bilaterally Extremities:  No clubbing, cyanosis, edema, or deformity noted with normal full range of motion of all joints.   Neurologic:  No cranial nerve deficits noted. Station and gait are normal. Plantar reflexes are down-going bilaterally. DTRs are symmetrical throughout. Sensory, motor and coordinative functions appear intact. Skin:  Intact without suspicious lesions or rashes Cervical Nodes:  No lymphadenopathy noted Axillary Nodes:  No palpable lymphadenopathy Inguinal Nodes:  No significant adenopathy Psych:  Cognition and judgment appear intact. Alert and cooperative with normal attention span and concentration. No apparent delusions, illusions, hallucinations   Impression & Recommendations:  Problem # 1:  HYPERTENSION NEC (ICD-997.91) Assessment Improved  Orders: Prescription Created Electronically [phone]) UA Dipstick w/o Micro (automated)  (81003) Venipuncture  (98119) TLB-Lipid Panel (80061-LIPID) TLB-BMP (Basic Metabolic Panel-BMET) (80048-METABOL) TLB-CBC Platelet - w/Differential (85025-CBCD) TLB-Hepatic/Liver Function Pnl (80076-HEPATIC) TLB-TSH (Thyroid Stimulating Hormone) (84443-TSH) EKG w/ Interpretation (93000)  Problem # 2:  DEPRESSION (ICD-311) Assessment: Improved  Her updated medication list for this problem includes:    Cymbalta 30 Mg Cpep (Duloxetine hcl) .Marland Kitchen... Take 1 capsule by mouth once a day  Orders: Prescription Created Electronically [phone]) UA Dipstick w/o Micro (automated)  (81003) Venipuncture (95621) TLB-Lipid Panel (80061-LIPID) TLB-BMP (Basic Metabolic Panel-BMET) (80048-METABOL) TLB-CBC Platelet - w/Differential (85025-CBCD) TLB-Hepatic/Liver Function Pnl (80076-HEPATIC) TLB-TSH (Thyroid Stimulating Hormone) (84443-TSH)  Problem # 3:  Preventive Health Care (ICD-V70.0) Assessment: Unchanged  Complete Medication List: 1)  Omeprazole 20 Mg Cpdr (Omeprazole) .... Take 1 capsule by mouth once a day 2)  Furosemide 20 Mg Tabs (Furosemide) .... Take 1 tablet by mouth once a day 3)  Lisinopril 20 Mg Tabs (Lisinopril) .... Take 1 tablet by mouth once a day 4)  Arimidex 1 Mg Tabs (Anastrozole) .... Take 1 tablet by mouth once a day 5)  Combivent 103-18 Mcg/act Aero (Albuterol-ipratropium) .... As needed 6)  Cymbalta 30 Mg Cpep (Duloxetine hcl) .... Take 1 capsule by mouth once a day 7)  Folic Acid 1 Mg Tabs (Folic acid) .... Take 1 tablet by mouth once a day 8)  Mirapex 0.5 Mg Tabs (Pramipexole dihydrochloride) .... Take 1 tablet by mouth once a day 9)  Histinex  10)  Calcitrate/vitamin D 315-200 Mg-unit Tabs (Calcium citrate-vitamin d) .... Take 1 tablet by mouth once a day 11)  Tramadol Hcl 50 Mg Tabs (Tramadol hcl) .... Take one tab two times a day 12)  Multivitamins Tabs (Multiple vitamin) .Marland Kitchen.. 1 tab once daily 13)  Fluconazole 100 Mg Tabs (Fluconazole) .... Use as directed 14)  Zometa 4 Mg/54ml Conc  (Zoledronic acid) .Marland Kitchen.. 1 injection every 6 months 15)  Prednisone 20 Mg Tabs (Prednisone) .... 1/2 tab once daily 16)  Vicodin 5-500 Mg Tabs (Hydrocodone-acetaminophen) .... Take one tab by mouth three times a day as needed for pain  Patient Instructions: 1)  continue your current treatment program return to see me in one year for general medical exam Prescriptions: MIRAPEX 0.5 MG  TABS (PRAMIPEXOLE DIHYDROCHLORIDE) Take 1 tablet by mouth once a day  #100 x 3   Entered and Authorized by:   Roderick Pee MD   Signed by:   Roderick Pee MD on 12/23/2009   Method used:   [facility]        [facility]  [phone]* (retail)       [address] [place], [place]  [address]       Ph: [phone] or [phone]       Fax: [phone]   RxID:   [id number] LISINOPRIL 20 MG  TABS (LISINOPRIL) Take 1 tablet by mouth once a day  #100 x 3   Entered and Authorized by:   Roderick Pee MD   Signed by:   Roderick Pee MD on 12/23/2009   Method used:   [facility]        [facility]  [phone]* (retail)       [address] [place], [place]  [address]       Ph: [phone] or [phone]       Fax: [phone]   RxID:   [id number] FUROSEMIDE 20 MG  TABS (FUROSEMIDE) Take 1 tablet by mouth once a day  #100 x 3   Entered and Authorized by:   Roderick Pee MD   Signed by:   Roderick Pee MD on 12/23/2009   Method used:   [facility]        [facility]  [phone]* (retail)       [address] [place], [place]  [address]       Ph: [phone] or [phone]       Fax: [phone]   RxID:   [id number] OMEPRAZOLE 20 MG  CPDR (OMEPRAZOLE) Take 1 capsule by mouth once a day  #100 Capsule x 3   Entered and Authorized by:   Roderick Pee MD   Signed by:   Roderick Pee MD on 12/23/2009   Method used:   [facility]        [facility]  [address]  [phone]* (retail)       [address] [place], [place]  [address]        Ph: [phone] or [phone]       Fax: [phone]   RxID:   [id number]    Immunization History:  Influenza Immunization History:    Influenza:  historical (07/15/2009)   Laboratory Results   Urine Tests  Date/Time Recieved: Dec 23, 2009 12:45 PM  Date/Time Reported: Dec 23, 2009 12:45 PM   Routine Urinalysis   Color: yellow Appearance: Clear Glucose: negative   (Normal Range: Negative) Bilirubin: negative   (Normal Range: Negative) Ketone: negative   (Normal Range: Negative) Spec. Gravity: 1.015   (Normal Range: 1.003-1.035)  Blood: negative   (Normal Range: Negative) pH: 7.5   (Normal Range: 5.0-8.0) Protein: negative   (Normal Range: Negative) Urobilinogen: 0.2   (Normal Range: 0-1) Nitrite: negative   (Normal Range: Negative) Leukocyte Esterace: negative   (Normal Range: Negative)    Comments: Wynona Canes, CMA  Dec 23, 2009 12:45 PM

## 2010-09-10 NOTE — Letter (Signed)
Summary: Colonoscopy Letter  [facility]  [address] [place], [place] [address]   Phone: [phone]  Fax: [phone]      October 13, 2009 MRN: [id number]   Tracey Nguyen [address] [place], [place]  [address]   Dear Ms. Leth,   According to your medical record, it is time for you to schedule a Colonoscopy. The American Cancer Society recommends this procedure as a method to detect early colon cancer. Patients with a family history of colon cancer, or a personal history of colon polyps or inflammatory bowel disease are at increased risk.  This letter has been generated based on the recommendations made at the time of your procedure. If you feel that in your particular situation this may no longer apply, please contact our office.  Please call our office at [phone] to schedule this appointment or to update your records at your earliest convenience.  Thank you for cooperating with [place] to provide you with the very best care possible.   Sincerely,  Wilhemina Bonito. Marina Goodell, M.D.  [facility] [phone]

## 2010-09-11 LAB — VITAMIN D 25 HYDROXY (VIT D DEFICIENCY, FRACTURES): Vit D, 25-Hydroxy: 43 ng/mL (ref 30–89)

## 2010-09-17 ENCOUNTER — Encounter (HOSPITAL_BASED_OUTPATIENT_CLINIC_OR_DEPARTMENT_OTHER): Payer: Medicare Other | Admitting: Oncology

## 2010-09-17 DIAGNOSIS — M810 Age-related osteoporosis without current pathological fracture: Secondary | ICD-10-CM

## 2010-09-17 DIAGNOSIS — C50919 Malignant neoplasm of unspecified site of unspecified female breast: Secondary | ICD-10-CM

## 2010-10-25 LAB — COMPREHENSIVE METABOLIC PANEL
ALT: 32 U/L (ref 0–35)
AST: 46 U/L — ABNORMAL HIGH (ref 0–37)
Albumin: 2.9 g/dL — ABNORMAL LOW (ref 3.5–5.2)
Calcium: 9.3 mg/dL (ref 8.4–10.5)
Creatinine, Ser: 0.8 mg/dL (ref 0.4–1.2)
GFR calc Af Amer: >60 mL/min (ref >60)
GFR calc non Af Amer: >60 mL/min (ref >60)
Sodium: 137 mEq/L (ref 135–145)
Total Protein: 5.2 g/dL — ABNORMAL LOW (ref 6.0–8.3)

## 2010-10-25 LAB — DIFFERENTIAL
Eosinophils Absolute: 0.8 10*3/uL — ABNORMAL HIGH (ref 0.0–0.7)
Eosinophils Relative: 6 % — ABNORMAL HIGH (ref 0–5)
Lymphocytes Relative: 18 % (ref 12–46)
Lymphs Abs: 2.2 10*3/uL (ref 0.7–4.0)
Monocytes Relative: 7 % (ref 3–12)

## 2010-10-25 LAB — RETICULOCYTES
RBC.: 3.94 MIL/uL (ref 3.87–5.11)
Retic Ct Pct: 3.6 % — ABNORMAL HIGH (ref 0.4–3.1)

## 2010-10-25 LAB — CBC
Hemoglobin: 8.1 g/dL — ABNORMAL LOW (ref 12.0–15.0)
Hemoglobin: 8.1 g/dL — ABNORMAL LOW (ref 12.0–15.0)
MCHC: 32 g/dL (ref 30.0–36.0)
MCHC: 32.5 g/dL (ref 30.0–36.0)
MCV: 67.4 fL — ABNORMAL LOW (ref 78.0–100.0)
MCV: 68 fL — ABNORMAL LOW (ref 78.0–100.0)
RBC: 3.68 MIL/uL — ABNORMAL LOW (ref 3.87–5.11)
RBC: 3.71 MIL/uL — ABNORMAL LOW (ref 3.87–5.11)
RDW: 14.7 % (ref 11.5–15.5)

## 2010-10-25 LAB — FERRITIN: Ferritin: 128 ng/mL (ref 10–291)

## 2010-10-25 LAB — IRON AND TIBC
Saturation Ratios: 49 % (ref 20–55)
UIBC: 125 ug/dL

## 2010-10-25 LAB — VITAMIN B12: Vitamin B-12: 273 pg/mL (ref 211–911)

## 2010-10-26 LAB — BASIC METABOLIC PANEL
BUN: 16 mg/dL (ref 6–23)
BUN: 27 mg/dL — ABNORMAL HIGH (ref 6–23)
BUN: 28 mg/dL — ABNORMAL HIGH (ref 6–23)
CO2: 29 mEq/L (ref 19–32)
CO2: 30 mEq/L (ref 19–32)
CO2: 32 mEq/L (ref 19–32)
Calcium: 8.3 mg/dL — ABNORMAL LOW (ref 8.4–10.5)
Calcium: 8.7 mg/dL (ref 8.4–10.5)
Calcium: 9.7 mg/dL (ref 8.4–10.5)
Chloride: 104 mEq/L (ref 96–112)
Chloride: 105 mEq/L (ref 96–112)
Chloride: 99 mEq/L (ref 96–112)
Creatinine, Ser: 0.97 mg/dL (ref 0.4–1.2)
Creatinine, Ser: 1.2 mg/dL (ref 0.4–1.2)
Creatinine, Ser: 1.25 mg/dL — ABNORMAL HIGH (ref 0.4–1.2)
GFR calc Af Amer: 51 mL/min — ABNORMAL LOW (ref >60)
GFR calc Af Amer: 54 mL/min — ABNORMAL LOW (ref >60)
GFR calc Af Amer: >60 mL/min (ref >60)
GFR calc non Af Amer: 42 mL/min — ABNORMAL LOW (ref >60)
GFR calc non Af Amer: 44 mL/min — ABNORMAL LOW (ref >60)
GFR calc non Af Amer: 57 mL/min — ABNORMAL LOW (ref >60)
Glucose, Bld: 119 mg/dL — ABNORMAL HIGH (ref 70–99)
Glucose, Bld: 130 mg/dL — ABNORMAL HIGH (ref 70–99)
Glucose, Bld: 132 mg/dL — ABNORMAL HIGH (ref 70–99)
Potassium: 4.6 mEq/L (ref 3.5–5.1)
Potassium: 4.7 mEq/L (ref 3.5–5.1)
Potassium: 6 mEq/L — ABNORMAL HIGH (ref 3.5–5.1)
Sodium: 137 mEq/L (ref 135–145)
Sodium: 138 mEq/L (ref 135–145)
Sodium: 140 mEq/L (ref 135–145)

## 2010-10-26 LAB — URINALYSIS, MICROSCOPIC ONLY
Bilirubin Urine: NEGATIVE
Ketones, ur: NEGATIVE mg/dL
Nitrite: NEGATIVE
Protein, ur: NEGATIVE mg/dL
Urobilinogen, UA: 1 mg/dL (ref 0.0–1.0)

## 2010-10-26 LAB — URINALYSIS, ROUTINE W REFLEX MICROSCOPIC
Bilirubin Urine: NEGATIVE
Bilirubin Urine: NEGATIVE
Glucose, UA: NEGATIVE mg/dL
Hgb urine dipstick: NEGATIVE
Hgb urine dipstick: NEGATIVE
Ketones, ur: NEGATIVE mg/dL
Ketones, ur: NEGATIVE mg/dL
Nitrite: NEGATIVE
Protein, ur: NEGATIVE mg/dL
Protein, ur: NEGATIVE mg/dL
Specific Gravity, Urine: 1.014 (ref 1.005–1.030)
Specific Gravity, Urine: 1.016 (ref 1.005–1.030)
Urobilinogen, UA: 0.2 mg/dL (ref 0.0–1.0)
Urobilinogen, UA: 1 mg/dL (ref 0.0–1.0)
pH: 7 (ref 5.0–8.0)

## 2010-10-26 LAB — CBC
HCT: 29.5 % — ABNORMAL LOW (ref 36.0–46.0)
HCT: 37.7 % (ref 36.0–46.0)
Hemoglobin: 11.9 g/dL — ABNORMAL LOW (ref 12.0–15.0)
Hemoglobin: 9.4 g/dL — ABNORMAL LOW (ref 12.0–15.0)
MCHC: 31.6 g/dL (ref 30.0–36.0)
MCHC: 31.9 g/dL (ref 30.0–36.0)
MCHC: 31.9 g/dL (ref 30.0–36.0)
MCV: 67 fL — ABNORMAL LOW (ref 78.0–100.0)
MCV: 68.3 fL — ABNORMAL LOW (ref 78.0–100.0)
Platelets: 181 10*3/uL (ref 150–400)
Platelets: 236 10*3/uL (ref 150–400)
RBC: 4.4 MIL/uL (ref 3.87–5.11)
RBC: 5.52 MIL/uL — ABNORMAL HIGH (ref 3.87–5.11)
RDW: 14.6 % (ref 11.5–15.5)
RDW: 15 % (ref 11.5–15.5)
RDW: 15.2 % (ref 11.5–15.5)
WBC: 20.9 10*3/uL — ABNORMAL HIGH (ref 4.0–10.5)
WBC: 8.9 10*3/uL (ref 4.0–10.5)

## 2010-10-26 LAB — GLUCOSE, CAPILLARY

## 2010-10-26 LAB — DIFFERENTIAL
Basophils Absolute: 0 10*3/uL (ref 0.0–0.1)
Basophils Relative: 0 % (ref 0–1)
Eosinophils Absolute: 0.2 10*3/uL (ref 0.0–0.7)
Eosinophils Relative: 1 % (ref 0–5)
Lymphocytes Relative: 8 % — ABNORMAL LOW (ref 12–46)
Lymphs Abs: 1.5 10*3/uL (ref 0.7–4.0)
Monocytes Absolute: 1 10*3/uL (ref 0.1–1.0)
Monocytes Relative: 5 % (ref 3–12)
Neutro Abs: 16.4 10*3/uL — ABNORMAL HIGH (ref 1.7–7.7)
Neutrophils Relative %: 86 % — ABNORMAL HIGH (ref 43–77)

## 2010-10-26 LAB — URINE CULTURE: Colony Count: 30000

## 2010-10-26 LAB — PROTIME-INR: Prothrombin Time: 14.4 seconds (ref 11.6–15.2)

## 2010-12-19 ENCOUNTER — Other Ambulatory Visit: Payer: Self-pay | Admitting: Family Medicine

## 2010-12-21 NOTE — Telephone Encounter (Signed)
Time for an office visit 

## 2010-12-25 NOTE — Op Note (Signed)
NAME:  Tracey Nguyen, Tracey Nguyen NO.:  [id number]   MEDICAL RECORD NO.:  [id number]          PATIENT TYPE:  INP   LOCATION:  5738                         FACILITY:  [facility]   PHYSICIAN:  Currie Paris, M.D.DATE OF BIRTH:  11-16-1940   DATE OF PROCEDURE:  11/30/2005  DATE OF DISCHARGE:  12/01/2005                                 OPERATIVE REPORT   PREOPERATIVE DIAGNOSIS:  Wound hematoma status post right mastectomy.   POSTOPERATIVE DIAGNOSIS:  Wound hematoma status post right mastectomy.   OPERATION:  Evacuation wound hematoma and replacing a drain.   SURGEON:  Currie Paris, M.D.   ANESTHESIA:  General.   CLINICAL HISTORY:  [name] is a 64-year lady who underwent a right  mastectomy yesterday.  On evaluation this morning, she appeared to have a  significant hematoma which seemed confined to the lower flap and the upper  flap appeared to be uninvolved as did the axilla which appeared quite flat.  I did not think she was actively bleeding as there was some thin blood  coming out the drains, but elected to take her to the operating room to  drain this and be sure.   DESCRIPTION OF PROCEDURE:  In the holding area the patient confirmed the  plans for surgery.  She was then taken to the operating room.  After  satisfactory general anesthesia had been obtained, the mastectomy wound was  prepped and draped.  The time-out occurred.   I removed the old staples.  The drains had been removed prio to prepping.  There was no hematoma or clot in the axillary area and there was minimal  under the superior flap.  However, there was about 250 mL of clot under the  inferior flap.  This was all debrided off.  I spent several minutes  irrigating.  I checked thoroughly all of the flaps again.  I found no active  bleeding, but places where I had rubbed clot off to inspect I cauterized  just to be sure we did not stir up any bleeding.  After inspecting and  irrigating,  I stopped for a few minutes and replaced the two drains secured  them with 2-0 nylons.  I then irrigated and looked again for bleeding and  did not see any.  I then held the flaps tight against the chest wall for 5  minutes by the clock.  We then irrigated again and no blood had accumulated.   I did a final irrigation and a final check for hemostasis and again there  did not appear to be any bleeding.  I reclosed the wound with staples.  I  left 30 mL of 0.25% plain Marcaine under the flaps for approximately 5  minutes.  This was then removed by charging the JPs and I held with a 5  minutes of pressure against the flaps to see if we could get a good seal and  be sure that anything that wanted to ooze had a chance to develop  hemostasis.   The patient the procedure well.  Sterile dressings were applied.  The  patient was taken PACU in satisfactory condition.  Currie Paris, M.D.  Electronically Signed     CJS/MEDQ  D:  11/30/2005  T:  12/01/2005  Job:  [id number]

## 2010-12-25 NOTE — Discharge Summary (Signed)
NAME:  [name], Tracey Nguyen NO.:  [id number]   MEDICAL RECORD NO.:  [id number]          PATIENT TYPE:  INP   LOCATION:  5738                         FACILITY:  [facility]   PHYSICIAN:  Currie Paris, M.D.DATE OF BIRTH:  1941-03-25   DATE OF ADMISSION:  11/29/2005  DATE OF DISCHARGE:  12/01/2005                                 DISCHARGE SUMMARY   OFFICE MEDICAL RECORD NUMBER:  [id number].   FINAL DIAGNOSES:  1. Multicentric right breast cancer.  2. Postop hematoma.   CLINICAL HISTORY:  [name] is a 70 year old lady with  multicentric right breast cancer.  She was admitted for surgery and postop  care.   HOSPITAL COURSE:  The patient was admitted on April 23 and taken to the  operating room where a right total mastectomy with axillary sentinel lymph  node biopsy was done.  The patient tolerated the procedure well.  She looked  fine the evening after surgery, but the next morning, she had developed a  hematoma.  She was returned to the operating room where her wound hematoma  was evacuated, and drains were replaced.   Following that, she did well.  She was maintained in the hospital overnight  and was able to be discharged on April 25.  The patient was discharged in  satisfactory condition, limited activity; this was explained to her.  Followup in our office in approximately a week.   Final pathology showed a multifocal invasive mammary carcinoma with 3  separate cancers.  All margins were negative.  Sentinel lymph nodes,  including some additional that were found with the mastectomy specimen, were  all negative.  She was ER positive, PR negative, HER-2/neu by FISH:  No  amplification.  A second cancer was also ER positive, PR negative, but was  3+ positive on HER-2/neu.      Currie Paris, M.D.  Electronically Signed     CJS/MEDQ  D:  05/04/2006  T:  05/04/2006  Job:  [id number]

## 2010-12-25 NOTE — Op Note (Signed)
NAME:  Tracey Nguyen, Tracey Nguyen NO.:  [id number]   MEDICAL RECORD NO.:  [id number]          PATIENT TYPE:  OIB   LOCATION:  5738                         FACILITY:  [facility]   PHYSICIAN:  Currie Paris, M.D.DATE OF BIRTH:  31-Oct-1940   DATE OF PROCEDURE:  11/29/2005  DATE OF DISCHARGE:                                 OPERATIVE REPORT   CCS:  [id number]   PREOPERATIVE DIAGNOSIS:  Multicentric right breast cancer.   POSTOPERATIVE DIAGNOSIS:  Multicentric right breast cancer.   OPERATION:  Right total mastectomy with blue dye injection and axillary  sentinel lymph node biopsy (two nodes).   SURGEON:  Dr. Jamey Ripa.   ASSISTANT:  Dr. Derrell Lolling.   ANESTHESIA:  General endotracheal.   CLINICAL HISTORY:  Ms. Bethards is a 64-year lady recently found to  have a right breast cancer and on further evaluation was found actually to  have multifocal disease in the same breast with the left breast being okay.  After a lengthy discussion with the patient, she elected to proceed to total  mastectomy with sentinel node evaluation. She elected no reconstruction at  this time.   DESCRIPTION OF PROCEDURE:  The patient was seen in the holding area and she  had no further questions. We confirmed that right mastectomy with sentinel  node evaluation was the planned procedure and she and I both marked the  right breast as the operative side.   The patient was taken to the operating room and after satisfactory general  anesthesia had been obtained, I prepped the area around the nipple and the  time-out occurred.   I injected about 5 mL of dilute methylene blue subareolarly and massaged  this in.   The breast was then prepped and draped as a sterile field. I marked the  inframammary fold at the midline of the sternum and the latissimus. I  outlined an elliptical incision to include the two core biopsy skin  incisions. I made the superior incision and raised the skin flap medially to  the  middle of the sternum, superiorly to the clavicle and laterally over the  lateral edge of the pectoralis and into the axilla. I had already used the  Neoprobe to identify a hot area and with some dissection I saw initially a  blue node which was grasped and excised with cautery. This had counts of  approximately 6-800. Using Neoprobe, I found another hot area and a little  bit more dissection showed a very small blue node, that was excised and had  counts of about 200. With the second node excised, counts in the axilla were  back to 0-5. There was no other visible node and no palpable adenopathy  noted.   I then made the inferior incision and raised the skin flap medially to the  sternum to actually below the inframammary fold over the rectus to get some  excessive fatty tissue out to try to get a smooth flap and then carry this  laterally until we could identify the anterior border of the latissimus. The  breast was then removed from medial to lateral starting around the  pectoralis and taking  the pectoralis fascia. The lateral attachments to the  clavipectoral fascia were divided and the breast was elevated off of the  serratus and then off of the anterior edge of the latissimus and finally the  axillary tail of Spence area was divided. What appeared to be two nodes in  the axillary tail of Spence were taken with the specimen. Both appeared to  be grossly normal.   I then spent several minutes making sure everything was dry. We placed two  19 Blake drains. I took some extra fatty tissue off of the inferior flap to  get this more smooth and make sure we had a smooth flap. The other flap all  appeared to be fairly either thickness. At this point, pathology reported  that the two sentinel nodes were negative. We did a final irrigation. We  secured the drains. A last check for hemostasis was made and again  everything was dry. The incision was closed with staples. I then left 30 mL  0.25%  Marcaine under the flaps for approximately 10 minutes before charging  the JP drains.   The patient tolerated the procedure well. There were no operative  complications. All counts were correct. Estimated blood loss was less 100  mL.      Currie Paris, M.D.  Electronically Signed     CJS/MEDQ  D:  11/29/2005  T:  11/30/2005  Job:  [id number]   cc:   Tinnie Gens A. Tawanna Cooler, M.D. [facility]  [address] [place]  [place] [address]

## 2010-12-30 ENCOUNTER — Other Ambulatory Visit: Payer: Self-pay | Admitting: Family Medicine

## 2011-01-27 ENCOUNTER — Other Ambulatory Visit: Payer: Self-pay | Admitting: Obstetrics & Gynecology

## 2011-01-27 DIAGNOSIS — Z9011 Acquired absence of right breast and nipple: Secondary | ICD-10-CM

## 2011-02-08 ENCOUNTER — Encounter: Payer: Self-pay | Admitting: Internal Medicine

## 2011-02-12 ENCOUNTER — Ambulatory Visit
Admission: RE | Admit: 2011-02-12 | Discharge: 2011-02-12 | Disposition: A | Discharge status: Home / Self Care | Payer: Medicare Other | Source: Ambulatory Visit | Attending: Obstetrics & Gynecology | Admitting: Obstetrics & Gynecology

## 2011-02-12 DIAGNOSIS — Z9011 Acquired absence of right breast and nipple: Secondary | ICD-10-CM

## 2011-02-12 LAB — HM MAMMOGRAPHY

## 2011-02-16 ENCOUNTER — Encounter: Payer: Self-pay | Admitting: Family Medicine

## 2011-02-24 ENCOUNTER — Other Ambulatory Visit: Payer: Self-pay | Admitting: Family Medicine

## 2011-03-16 ENCOUNTER — Other Ambulatory Visit: Payer: Self-pay | Admitting: Oncology

## 2011-03-16 ENCOUNTER — Encounter: Payer: Medicare Other | Admitting: Oncology

## 2011-03-16 DIAGNOSIS — C50919 Malignant neoplasm of unspecified site of unspecified female breast: Secondary | ICD-10-CM

## 2011-03-16 DIAGNOSIS — M810 Age-related osteoporosis without current pathological fracture: Secondary | ICD-10-CM

## 2011-03-16 LAB — CBC WITH DIFFERENTIAL/PLATELET
BASO%: 0.7 % (ref 0.0–2.0)
LYMPH%: 19.5 % (ref 14.0–49.7)
MCHC: 31.8 g/dL (ref 31.5–36.0)
MONO#: 0.7 10*3/uL (ref 0.1–0.9)
Platelets: 251 10*3/uL (ref 145–400)
RBC: 5.94 10*6/uL — ABNORMAL HIGH (ref 3.70–5.45)
RDW: 15 % — ABNORMAL HIGH (ref 11.2–14.5)
WBC: 8.8 10*3/uL (ref 3.9–10.3)
lymph#: 1.7 10*3/uL (ref 0.9–3.3)

## 2011-03-17 ENCOUNTER — Telehealth: Payer: Self-pay | Admitting: *Deleted

## 2011-03-17 LAB — COMPREHENSIVE METABOLIC PANEL
ALT: 11 U/L (ref 0–35)
AST: 14 U/L (ref 0–37)
Alkaline Phosphatase: 64 U/L (ref 39–117)
BUN: 19 mg/dL (ref 6–23)
Calcium: 9.8 mg/dL (ref 8.4–10.5)
Creatinine, Ser: 1.15 mg/dL — ABNORMAL HIGH (ref 0.50–1.10)
Total Bilirubin: 0.3 mg/dL (ref 0.3–1.2)

## 2011-03-17 LAB — VITAMIN D 25 HYDROXY (VIT D DEFICIENCY, FRACTURES): Vit D, 25-Hydroxy: 34 ng/mL (ref 30–89)

## 2011-03-17 LAB — CANCER ANTIGEN 27.29: CA 27.29: 11 U/mL (ref 0–39)

## 2011-03-17 LAB — FERRITIN: Ferritin: 22 ng/mL (ref 10–291)

## 2011-03-17 NOTE — Telephone Encounter (Signed)
Called patient N/A at home or mobile.

## 2011-03-18 ENCOUNTER — Ambulatory Visit (AMBULATORY_SURGERY_CENTER): Payer: Medicare Other | Admitting: *Deleted

## 2011-03-18 VITALS — Ht <= 58 in | Wt 165.2 lb

## 2011-03-18 DIAGNOSIS — Z1211 Encounter for screening for malignant neoplasm of colon: Secondary | ICD-10-CM

## 2011-03-18 MED ORDER — PEG-KCL-NACL-NASULF-NA ASC-C 100 G PO SOLR: ORAL | Status: DC

## 2011-03-23 ENCOUNTER — Encounter (HOSPITAL_BASED_OUTPATIENT_CLINIC_OR_DEPARTMENT_OTHER): Payer: Medicare Other | Admitting: Oncology

## 2011-03-23 DIAGNOSIS — M810 Age-related osteoporosis without current pathological fracture: Secondary | ICD-10-CM

## 2011-03-23 DIAGNOSIS — C50919 Malignant neoplasm of unspecified site of unspecified female breast: Secondary | ICD-10-CM

## 2011-03-25 NOTE — Telephone Encounter (Signed)
Called patient again and left message for her to call and schedule procedure.

## 2011-04-01 ENCOUNTER — Other Ambulatory Visit: Payer: Medicare Other | Admitting: Internal Medicine

## 2011-04-08 ENCOUNTER — Encounter: Payer: Medicare Other | Admitting: Internal Medicine

## 2011-05-03 ENCOUNTER — Encounter: Payer: Self-pay | Admitting: Internal Medicine

## 2011-05-03 ENCOUNTER — Ambulatory Visit (AMBULATORY_SURGERY_CENTER): Payer: Medicare Other | Admitting: Internal Medicine

## 2011-05-03 VITALS — BP 156/93 | HR 96 | Temp 99.9°F | Resp 20 | Ht <= 58 in | Wt 165.0 lb

## 2011-05-03 DIAGNOSIS — Z1211 Encounter for screening for malignant neoplasm of colon: Secondary | ICD-10-CM

## 2011-05-03 DIAGNOSIS — Z86010 Personal history of colonic polyps: Secondary | ICD-10-CM

## 2011-05-03 DIAGNOSIS — D126 Benign neoplasm of colon, unspecified: Secondary | ICD-10-CM

## 2011-05-03 MED ORDER — SODIUM CHLORIDE 0.9 % IV SOLN: 500.0000 mL | INTRAVENOUS | Status: DC

## 2011-05-03 NOTE — Progress Notes (Signed)
No complaints in the recovery room or on discharge. MAW 

## 2011-05-03 NOTE — Patient Instructions (Signed)
See the picture page for your findings from your exam today.  Follow the green and blue discharge instruction sheets the rest of the day.  Resume your prior medications today. Please call if any questions or concerns.  

## 2011-05-04 ENCOUNTER — Telehealth: Payer: Self-pay | Admitting: *Deleted

## 2011-05-04 NOTE — Telephone Encounter (Signed)

## 2011-05-28 ENCOUNTER — Other Ambulatory Visit: Payer: Self-pay | Admitting: Family Medicine

## 2011-06-10 ENCOUNTER — Telehealth: Payer: Self-pay | Admitting: *Deleted

## 2011-06-10 MED ORDER — IPRATROPIUM-ALBUTEROL 18-103 MCG/ACT IN AERO: 2.0000 | INHALATION_SPRAY | RESPIRATORY_TRACT | Status: DC | PRN

## 2011-06-10 NOTE — Telephone Encounter (Signed)
Patient needs a refill of albuterol. She has had some problems with asthma for 2 weeks and an appointment made for next week.  Appointment offered today but patient refused.

## 2011-06-15 ENCOUNTER — Encounter: Payer: Self-pay | Admitting: Family Medicine

## 2011-06-15 ENCOUNTER — Ambulatory Visit (INDEPENDENT_AMBULATORY_CARE_PROVIDER_SITE_OTHER): Payer: Medicare Other | Admitting: Family Medicine

## 2011-06-15 ENCOUNTER — Other Ambulatory Visit: Payer: Self-pay | Admitting: Family Medicine

## 2011-06-15 ENCOUNTER — Ambulatory Visit (INDEPENDENT_AMBULATORY_CARE_PROVIDER_SITE_OTHER)
Admission: RE | Admit: 2011-06-15 | Discharge: 2011-06-15 | Disposition: A | Discharge status: Home / Self Care | Payer: Medicare Other | Source: Ambulatory Visit | Attending: Family Medicine | Admitting: Family Medicine

## 2011-06-15 VITALS — BP 120/80 | Temp 98.7°F | Wt 166.0 lb

## 2011-06-15 DIAGNOSIS — R0602 Shortness of breath: Secondary | ICD-10-CM

## 2011-06-15 NOTE — Patient Instructions (Signed)
Ago to the main office now for your chest x-ray.  I will call you the report.  Tonight

## 2011-06-15 NOTE — Progress Notes (Signed)
  Subjective:    Patient ID: Tracey Nguyen, female    DOB: 10-Sep-1940, 70 y.o.   MRN: [id number]  HPI  [name] Is a 70 year old, married female, nonsmoker, who comes in today for evaluation of shortness of breath for one month.  She states for the past, month.  She's been short of breath.  She thought it might be just deconditioning because she broke her hip.  Now she is up and about with a cane and is still short of breath.  She restarted her Combivent to plus 3 times daily.  Last Friday.  She has a history of allergic rhinitis and asthma.  She recently had a cardiac evaluation by Dr. Daleen Squibb, which she states included a stress test and angiogram both of which she was told were normal.  She has no fever, earache, sore throat, or sputum production.  Last chest x-ray 3 years ago    Review of Systems    General cardia, para pulmonary is systems otherwise negative Objective:   Physical Exam  Short overweight, female, in no acute distress, despite her rate 12 and unlabored.  Examination of the heart was normal.  Pulmonary exam shows symmetrical.  Breath sounds.  No wheezing      Assessment & Plan:  Dyspnea unknown etiology.  Plan begin workup, start with chest x-ray and pulmonary consult

## 2011-06-16 ENCOUNTER — Other Ambulatory Visit: Payer: Self-pay | Admitting: Family Medicine

## 2011-06-16 DIAGNOSIS — R0602 Shortness of breath: Secondary | ICD-10-CM

## 2011-06-16 DIAGNOSIS — R0789 Other chest pain: Secondary | ICD-10-CM

## 2011-06-17 ENCOUNTER — Encounter: Payer: Self-pay | Admitting: Critical Care Medicine

## 2011-06-17 ENCOUNTER — Ambulatory Visit (INDEPENDENT_AMBULATORY_CARE_PROVIDER_SITE_OTHER): Payer: Medicare Other | Admitting: Critical Care Medicine

## 2011-06-17 DIAGNOSIS — R0609 Other forms of dyspnea: Secondary | ICD-10-CM

## 2011-06-17 DIAGNOSIS — R0600 Dyspnea, unspecified: Secondary | ICD-10-CM

## 2011-06-17 DIAGNOSIS — I10 Essential (primary) hypertension: Secondary | ICD-10-CM

## 2011-06-17 DIAGNOSIS — R0602 Shortness of breath: Secondary | ICD-10-CM

## 2011-06-17 MED ORDER — LOSARTAN POTASSIUM 100 MG PO TABS: 100.0000 mg | ORAL_TABLET | Freq: Every day | ORAL | Status: DC

## 2011-06-17 NOTE — Progress Notes (Signed)
Subjective:    Patient ID: Tracey Nguyen, female    DOB: July 17, 1941, 70 y.o.   MRN: [id number]  HPI Comments: Abn CXR LLL Started 6weeks ago. .  Noticed progressive dyspnea. Fractured hip 01/16/10. Poor ambulation after this event.     Shortness of Breath This is a new problem. The current episode started more than 1 month ago. Episode frequency: dyspnea with exertion only. The problem has been waxing and waning. Duration: will recover  Associated symptoms include headaches, leg pain, leg swelling, neck pain, orthopnea, rhinorrhea and wheezing. Pertinent negatives include no abdominal pain, chest pain, claudication, coryza, ear pain, fever, PND, rash, sore throat, sputum production, swollen glands, syncope or vomiting. The symptoms are aggravated by any activity and emotional upset. Associated symptoms comments: Sleeps in a chair. Risk factors include prolonged immobilization. She has tried beta agonist inhalers for the symptoms. The treatment provided moderate relief. Her past medical history is significant for asthma. There is no history of allergies, aspirin allergies, bronchiolitis, CAD, chronic lung disease, COPD, DVT, a heart failure, PE, pneumonia or a recent surgery. (Dx asthma as child and after move to [place] 1979)   70 y.o.F referred for abn CXR and dyspnea    Review of Systems  Constitutional: Positive for fatigue. Negative for fever, chills, diaphoresis, activity change, appetite change and unexpected weight change.  HENT: Positive for hearing loss, congestion, rhinorrhea, sneezing, trouble swallowing, neck pain, neck stiffness, voice change, postnasal drip and ear discharge. Negative for ear pain, nosebleeds, sore throat, facial swelling, mouth sores, dental problem, sinus pressure and tinnitus.   Eyes: Positive for discharge and visual disturbance. Negative for photophobia and itching.  Respiratory: Positive for cough, chest tightness, shortness of breath and wheezing.  Negative for apnea, sputum production, choking and stridor.   Cardiovascular: Positive for orthopnea and leg swelling. Negative for chest pain, palpitations, claudication, syncope and PND.  Gastrointestinal: Positive for constipation and blood in stool. Negative for nausea, vomiting, abdominal pain and abdominal distention.       Hemorrhoids are blood in stool cause  Genitourinary: Positive for urgency. Negative for dysuria, frequency, hematuria, flank pain, decreased urine volume and difficulty urinating.  Musculoskeletal: Positive for back pain. Negative for myalgias, joint swelling, arthralgias and gait problem.  Skin: Negative for color change, pallor and rash.  Neurological: Positive for tremors, numbness and headaches. Negative for dizziness, seizures, syncope, speech difficulty, weakness and light-headedness.  Hematological: Negative for adenopathy. Does not bruise/bleed easily.  Psychiatric/Behavioral: Positive for sleep disturbance. Negative for confusion and agitation. The patient is not nervous/anxious.        Has sleep apnea       Objective:   Physical Exam Filed Vitals:   06/17/11 1604  BP: 106/78  Pulse: 111  Temp: 99 F (37.2 C)  TempSrc: Oral  Height: 4' 9.5" (1.461 m)  Weight: 170 lb (77.111 kg)  SpO2: 96%    Gen: Pleasant, obese, in no distress,  normal affect  ENT: No lesions,  mouth clear,  oropharynx clear, no postnasal drip  Neck: No JVD, no TMG, no carotid bruits  Lungs: No use of accessory muscles, no dullness to percussion, distant BS  Cardiovascular: RRR, heart sounds normal, no murmur or gallops, no peripheral edema  Abdomen: soft and NT, no HSM,  BS normal  Musculoskeletal: kyphoscoliosis,  no cyanosis or clubbing  Neuro: alert, non focal  Skin: Warm, no lesions or rashes   CXR 11/6 1. Left basilar heterogeneous opacities worrisome for infection.  Follow-up  chest radiograph in 4 to 6 weeks after treatment is  recommended to ensure  resolution. >>>>>Looks like ATX to me  PW 2. Possible minimal increase in focal scoliotic curvature of the  mid/lower thoracic spine, possibly positional. Attention on follow-  up is recommended.        Assessment & Plan:   DYSPNEA Dyspnea with upper airway obstruction and restriction exam due to spinal curvature, short neck, small oral airway,  Suspect ACE inhibitor is a ppt factor. LLL is atelectatic  , doubt CA, doubt PNA.  R/o chronic PE Plan Stop lisinopril Start losartan 100mg  daily Obtain a CT Chest with contrast at [facility] CT Scanner Obtain full pulmonary function study and 6 minute walk Return for follow in [place] office after above done     Updated Medication List Outpatient Encounter Prescriptions as of 06/17/2011  Medication Sig Dispense Refill  . albuterol-ipratropium (COMBIVENT) 18-103 MCG/ACT inhaler Inhale 2 puffs into the lungs every 4 (four) hours as needed for wheezing.  3 Inhaler  4  . Calcium Carbonate (CALTRATE 600 PO) Take by mouth daily.        . CYMBALTA 30 MG capsule TAKE 1 CAPSULE BY MOUTH EVERY DAY  30 capsule  5  . furosemide (LASIX) 20 MG tablet TAKE 1 TABLET EVERY DAY  90 tablet  0  . Multiple Vitamin (MULTIVITAMIN) tablet Take 1 tablet by mouth every other day.       Marland Kitchen omeprazole (PRILOSEC) 20 MG capsule TAKE 1 CAPSULE EVERY DAY  100 capsule  2  . pramipexole (MIRAPEX) 1 MG tablet daily.       . Zoledronic Acid (ZOMETA IV) Inject into the vein. Takes twice a year.       Marland Kitchen DISCONTD: lisinopril (PRINIVIL,ZESTRIL) 20 MG tablet TAKE 1 TABLET EVERY DAY  100 tablet  2  . losartan (COZAAR) 100 MG tablet Take 1 tablet (100 mg total) by mouth daily.  30 tablet  6  . DISCONTD: acetaminophen (TYLENOL) 650 MG CR tablet Take 650 mg by mouth every 8 (eight) hours as needed.        Marland Kitchen DISCONTD: ALBUTEROL IN Inhale into the lungs. Combivent Inhaler uses as needed for asthma       . DISCONTD: MOVIPREP 100 G SOLR       . DISCONTD: naproxen sodium (ANAPROX) 220  MG tablet Take 220 mg by mouth 2 (two) times daily with a meal.

## 2011-06-17 NOTE — Patient Instructions (Signed)
Stop lisinopril Start losartan 100mg  daily Obtain a CT Chest with contrast at [facility] CT Scanner Obtain full pulmonary function study and 6 minute walk Return for follow in [place] office after above done

## 2011-06-18 ENCOUNTER — Ambulatory Visit: Payer: Medicare Other

## 2011-06-18 ENCOUNTER — Telehealth: Payer: Self-pay | Admitting: Pulmonary Disease

## 2011-06-18 ENCOUNTER — Ambulatory Visit (INDEPENDENT_AMBULATORY_CARE_PROVIDER_SITE_OTHER)
Admission: RE | Admit: 2011-06-18 | Discharge: 2011-06-18 | Disposition: A | Discharge status: Home / Self Care | Payer: Medicare Other | Source: Ambulatory Visit | Attending: Critical Care Medicine | Admitting: Critical Care Medicine

## 2011-06-18 DIAGNOSIS — R0600 Dyspnea, unspecified: Secondary | ICD-10-CM

## 2011-06-18 DIAGNOSIS — R0609 Other forms of dyspnea: Secondary | ICD-10-CM

## 2011-06-18 LAB — CREATININE, SERUM: Creatinine, Ser: 1.1 mg/dL (ref 0.4–1.2)

## 2011-06-18 LAB — BUN: BUN: 24 mg/dL — ABNORMAL HIGH (ref 6–23)

## 2011-06-18 MED ORDER — IOHEXOL 300 MG/ML  SOLN
80.0000 mL | Freq: Once | INTRAMUSCULAR | Status: AC | PRN
  Administered 2011-06-18: 80 mL via INTRAVENOUS

## 2011-06-18 NOTE — Assessment & Plan Note (Signed)
Dyspnea with upper airway obstruction and restriction exam due to spinal curvature, short neck, small oral airway,  Suspect ACE inhibitor is a ppt factor. LLL is atelectatic  , doubt CA, doubt PNA.  R/o chronic PE Plan Stop lisinopril Start losartan 100mg  daily Obtain a CT Chest with contrast at [facility] CT Scanner Obtain full pulmonary function study and 6 minute walk Return for follow in [place] office after above done

## 2011-06-18 NOTE — Telephone Encounter (Signed)
Ct Angio Chest W/cm &/or Wo Cm  06/18/2011  *RADIOLOGY REPORT*  Clinical Data:  Chronic chest pain and shortness of breath over the past several weeks.  History of asthma.  CT ANGIOGRAPHY CHEST WITH CONTRAST 06/18/2011:  Technique:  Multidetector CT imaging of the chest was performed using the standard protocol during bolus administration of intravenous contrast.  Multiplanar CT image reconstructions including MIPs were obtained to evaluate the vascular anatomy.  Contrast: 80mL OMNIPAQUE IOHEXOL 300 MG/ML IV SOLN  Comparison:  No prior CT.  Two-view chest x-ray 06/15/2011 [facility].  Findings:  Contrast opacification of the pulmonary arteries is good.  No filling defects within either main pulmonary artery or their branches in either lung to suggest pulmonary embolism.  Heart moderately enlarged with left ventricular predominance and borderline to mild left ventricular hypertrophy.  Minimal LAD and right coronary calcification.  No pericardial effusion.  Thoracic aorta tortuous and only mildly atherosclerotic without evidence of aneurysm or dissection.  Proximal great vessels tortuous but patent.  Suboptimal inspiration due to body habitus with atelectasis in the lower lobes.  Scattered areas of hyperlucency throughout both lungs.  Tiny nodule in the inferior right upper lobe approximating 4 mm (series 6, image 29), with a second oval-shaped nodule immediately superior measuring approximately 4 mm.  Scarring anteriorly in the inferior upper lobes bilaterally.  No confluent airspace consolidation.  No pleural effusions.  Right mastectomy.  No evidence of recurrent tumor in the right chest wall.  No significant mediastinal, hilar, or axillary lymphadenopathy.  No right subpectoral nodes.  Visualized thyroid gland unremarkable.  Approximate 6.0 x 4.5 cm cyst in the anterior segment right lower lobe of liver.  Approximate 4 cm diameter cyst arising from the upper pole of the spleen.  Remaining visualized  upper abdomen unremarkable for the early arterial phase of enhancement.  Bone window images demonstrate thoracic scoliosis convex left, severe mid thoracic scoliosis, old compression fracture of T7, old healed sternal fracture, and old fractures involving multiple right posterior ribs, some of which demonstrate nonunion.  Review of the MIP images confirms the above findings.  IMPRESSION:  1.  No evidence of pulmonary embolism. 2.  Suboptimal inspiration due to body habitus which accounts for atelectasis in the lower lobes. 3.  Scattered areas of hyperlucency throughout both lungs consistent with localized air trapping as can be seen in patients with asthma and/or bronchitis.  No confluent airspace pneumonia. 4.  2 adjacent approximate 4 mm nodules in the inferior right upper lobe.  No pulmonary nodules elsewhere. 5.  Cardiomegaly with borderline to mild left ventricular hypertrophy. 6.  Large cysts in the right lobe of liver and the upper pole of the spleen. 7.  Right mastectomy.  No evidence of recurrent or metastatic breast cancer.  If the patient is at high risk for bronchogenic carcinoma, follow- up chest CT at 1 year is recommended.  If the patient is at low risk, no follow-up is needed.  This recommendation follows the consensus statement: Guidelines for Management of Small Pulmonary Nodules Detected on CT Scans:  A Statement from the Fleischner Society as published in Radiology 2005; 237:395-400.  Available online at:  DietDisorder.cz.  Original Report Authenticated By: Arnell Sieving, M.D.    Received call from radiology for Dr. Lynelle Doctor patient.  No evidence for PE on CT chest.  More consistent with obstructive lung disease.  Pt informed radiology staff that she is short of breath, but no worse than she has been for past several  weeks.  Advised that it would okay to have pt d/c home and Dr. Delford Field will contact pt after he reviews CT chest.

## 2011-06-20 NOTE — Telephone Encounter (Signed)
Get pfts done , then we will make rx rec Tell pt she does not have pulmonary embolii

## 2011-06-21 NOTE — Telephone Encounter (Signed)
Spoke with pt and notified of results/recs per PW. Pt verbalized understanding and denied any questions.

## 2011-06-22 ENCOUNTER — Telehealth: Payer: Self-pay | Admitting: Critical Care Medicine

## 2011-06-22 DIAGNOSIS — R0602 Shortness of breath: Secondary | ICD-10-CM

## 2011-06-22 DIAGNOSIS — J449 Chronic obstructive pulmonary disease, unspecified: Secondary | ICD-10-CM

## 2011-06-22 MED ORDER — BUDESONIDE-FORMOTEROL FUMARATE 160-4.5 MCG/ACT IN AERO: 2.0000 | INHALATION_SPRAY | Freq: Two times a day (BID) | RESPIRATORY_TRACT | Status: DC

## 2011-06-22 NOTE — Telephone Encounter (Signed)
I gave result of CT scan to the pt. We will start symbicort two puff bid in advance of the PFT on 07/09/11

## 2011-06-28 ENCOUNTER — Other Ambulatory Visit: Payer: Self-pay | Admitting: Family Medicine

## 2011-07-09 ENCOUNTER — Ambulatory Visit (INDEPENDENT_AMBULATORY_CARE_PROVIDER_SITE_OTHER): Payer: Medicare Other | Admitting: Critical Care Medicine

## 2011-07-09 ENCOUNTER — Encounter: Payer: Self-pay | Admitting: Critical Care Medicine

## 2011-07-09 VITALS — BP 160/98 | HR 108 | Temp 98.5°F | Ht <= 58 in | Wt 172.0 lb

## 2011-07-09 DIAGNOSIS — R0600 Dyspnea, unspecified: Secondary | ICD-10-CM

## 2011-07-09 DIAGNOSIS — Z23 Encounter for immunization: Secondary | ICD-10-CM

## 2011-07-09 DIAGNOSIS — J45909 Unspecified asthma, uncomplicated: Secondary | ICD-10-CM

## 2011-07-09 DIAGNOSIS — R0609 Other forms of dyspnea: Secondary | ICD-10-CM

## 2011-07-09 DIAGNOSIS — R0989 Other specified symptoms and signs involving the circulatory and respiratory systems: Secondary | ICD-10-CM

## 2011-07-09 LAB — PULMONARY FUNCTION TEST

## 2011-07-09 MED ORDER — AMOXICILLIN-POT CLAVULANATE 875-125 MG PO TABS: 1.0000 | ORAL_TABLET | Freq: Two times a day (BID) | ORAL | Status: AC

## 2011-07-09 MED ORDER — PREDNISONE 10 MG PO TABS: ORAL_TABLET | ORAL | Status: DC

## 2011-07-09 NOTE — Patient Instructions (Signed)
Start augmentin one twice daily Prednisone 10mg  Take 4 for three days 3 for three days 2 for three days 1 for three days and stop Stay on symbicort Weight loss, use reflux diet Return 2 months Get your cpap fixed and wear , call us when this is done

## 2011-07-09 NOTE — Progress Notes (Signed)
Subjective:    Patient ID: Tracey Nguyen, female    DOB: Jul 01, 1941, 70 y.o.   MRN: [id number]  HPI Comments: Abn CXR LLL Started 6weeks ago. .  Noticed progressive dyspnea. Fractured hip 01/16/10. Poor ambulation after this event.     Shortness of Breath This is a new problem. The current episode started more than 1 month ago. Episode frequency: dyspnea with exertion only. The problem has been waxing and waning. Duration: will recover  Associated symptoms include headaches, leg pain, leg swelling, neck pain, orthopnea, rhinorrhea and wheezing. Pertinent negatives include no abdominal pain, chest pain, claudication, coryza, ear pain, fever, PND, rash, sore throat, sputum production, swollen glands, syncope or vomiting. The symptoms are aggravated by any activity and emotional upset. Associated symptoms comments: Sleeps in a chair. Risk factors include prolonged immobilization. She has tried beta agonist inhalers for the symptoms. The treatment provided moderate relief. Her past medical history is significant for asthma. There is no history of allergies, aspirin allergies, bronchiolitis, CAD, chronic lung disease, COPD, DVT, a heart failure, PE, pneumonia or a recent surgery. (Dx asthma as child and after move to [place] 1979)   40 y.o.F referred for abn CXR and dyspnea  11/30 At last ov we rec: Stop lisinopril Start losartan 100mg  daily Obtain a CT Chest with contrast at [facility] CT Scanner Obtain full pulmonary function study and 6 minute walk Return for follow in [place] office after above done  Cough is more, and is looser.  Gets up mucus, white and thick.  Occ green.  Notes some L ear pain and sinus is draining.    Diet: breakfast: coffee, cheerios.  Lunch: tacos. Fast food.  Chicken noodle soup chick fil a, or fried chick fil a sandwich.    Dinner:  Same type of food.  Review of Systems  Constitutional: Positive for fatigue. Negative for fever, chills, diaphoresis, activity  change, appetite change and unexpected weight change.  HENT: Positive for hearing loss, congestion, rhinorrhea, sneezing, trouble swallowing, neck pain, neck stiffness, voice change, postnasal drip and ear discharge. Negative for ear pain, nosebleeds, sore throat, facial swelling, mouth sores, dental problem, sinus pressure and tinnitus.   Eyes: Positive for discharge and visual disturbance. Negative for photophobia and itching.  Respiratory: Positive for cough, chest tightness, shortness of breath and wheezing. Negative for apnea, sputum production, choking and stridor.   Cardiovascular: Positive for orthopnea and leg swelling. Negative for chest pain, palpitations, claudication, syncope and PND.  Gastrointestinal: Positive for constipation and blood in stool. Negative for nausea, vomiting, abdominal pain and abdominal distention.       Hemorrhoids are blood in stool cause  Genitourinary: Positive for urgency. Negative for dysuria, frequency, hematuria, flank pain, decreased urine volume and difficulty urinating.  Musculoskeletal: Positive for back pain. Negative for myalgias, joint swelling, arthralgias and gait problem.  Skin: Negative for color change, pallor and rash.  Neurological: Positive for tremors, numbness and headaches. Negative for dizziness, seizures, syncope, speech difficulty, weakness and light-headedness.  Hematological: Negative for adenopathy. Does not bruise/bleed easily.  Psychiatric/Behavioral: Positive for sleep disturbance. Negative for confusion and agitation. The patient is not nervous/anxious.        Has sleep apnea       Objective:   Physical Exam  Filed Vitals:   07/09/11 1049  BP: 160/98  Pulse: 108  Temp: 98.5 F (36.9 C)  TempSrc: Oral  Height: 4\' 10"  (1.473 m)  Weight: 78.019 kg (172 lb)  SpO2: 90%  Gen: Pleasant, obese, in no distress,  normal affect  ENT: No lesions,  mouth clear,  oropharynx clear, purulent nares Neck: No JVD, no TMG, no  carotid bruits  Lungs: No use of accessory muscles, no dullness to percussion, distant BS  Cardiovascular: RRR, heart sounds normal, no murmur or gallops, no peripheral edema  Abdomen: soft and NT, no HSM,  BS normal  Musculoskeletal: kyphoscoliosis,  no cyanosis or clubbing  Neuro: alert, non focal  Skin: Warm, no lesions or rashes   CXR 11/6 1. Left basilar heterogeneous opacities worrisome for infection.  Follow-up chest radiograph in 4 to 6 weeks after treatment is  recommended to ensure resolution. >>>>>Looks like ATX to me  PW 2. Possible minimal increase in focal scoliotic curvature of the  mid/lower thoracic spine, possibly positional. Attention on follow-  up is recommended.        Assessment & Plan:   ASTHMA Asthmatic bronchitis with sinusitis Airway obstruction and restriction on pfts 11/30 with no desat Plan Start augmentin one twice daily Prednisone 10mg  Take 4 for three days 3 for three days 2 for three days 1 for three days and stop Stay on symbicort Weight loss, use reflux diet Return 2 months      Updated Medication List Outpatient Encounter Prescriptions as of 07/09/2011  Medication Sig Dispense Refill  . albuterol-ipratropium (COMBIVENT) 18-103 MCG/ACT inhaler Inhale 2 puffs into the lungs every 4 (four) hours as needed for wheezing.  3 Inhaler  4  . budesonide-formoterol (SYMBICORT) 160-4.5 MCG/ACT inhaler Inhale 2 puffs into the lungs 2 (two) times daily.  1 Inhaler  12  . Calcium Carbonate (CALTRATE 600 PO) Take by mouth daily.        . CYMBALTA 30 MG capsule TAKE 1 CAPSULE BY MOUTH EVERY DAY  30 capsule  5  . furosemide (LASIX) 20 MG tablet TAKE 1 TABLET EVERY DAY  90 tablet  0  . losartan (COZAAR) 100 MG tablet Take 1 tablet (100 mg total) by mouth daily.  30 tablet  6  . Multiple Vitamin (MULTIVITAMIN) tablet Take 1 tablet by mouth every other day.       Marland Kitchen omeprazole (PRILOSEC) 20 MG capsule TAKE 1 CAPSULE EVERY DAY  100 capsule  2    . pramipexole (MIRAPEX) 1 MG tablet Take 1 mg by mouth daily.       . Zoledronic Acid (ZOMETA IV) Inject into the vein. Takes twice a year.       Marland Kitchen amoxicillin-clavulanate (AUGMENTIN) 875-125 MG per tablet Take 1 tablet by mouth 2 (two) times daily.  20 tablet  0  . predniSONE (DELTASONE) 10 MG tablet Take 4 for three days 3 for three days 2 for three days 1 for three days and stop  30 tablet  0

## 2011-07-09 NOTE — Progress Notes (Signed)
PFT done today. 

## 2011-07-11 NOTE — Assessment & Plan Note (Signed)
Asthmatic bronchitis with sinusitis Airway obstruction and restriction on pfts 11/30 with no desat Plan Start augmentin one twice daily Prednisone 10mg  Take 4 for three days 3 for three days 2 for three days 1 for three days and stop Stay on symbicort Weight loss, use reflux diet Return 2 months

## 2011-08-24 ENCOUNTER — Telehealth: Payer: Self-pay | Admitting: Critical Care Medicine

## 2011-08-24 NOTE — Telephone Encounter (Signed)
Nothing else, just need to see the sleep study result

## 2011-08-24 NOTE — Telephone Encounter (Signed)
Pt says she will have her sleep test at [facility] tonight and she has received her new CPAP supplies. She says Dr. Delford Field wanted to know this information and she wants to know if there is anything else Dr. Delford Field needs them to do during the sleep test. Pt aware Dr. Delford Field is out of the office today but will forward this msg to him. I asked that the pt have her sleep results forwarded to Dr. Delford Field so we have a record of this in her chart.

## 2011-09-06 ENCOUNTER — Other Ambulatory Visit: Payer: Self-pay | Admitting: *Deleted

## 2011-09-06 MED ORDER — FUROSEMIDE 20 MG PO TABS: 20.0000 mg | ORAL_TABLET | Freq: Every day | ORAL | Status: DC

## 2011-09-08 ENCOUNTER — Ambulatory Visit (INDEPENDENT_AMBULATORY_CARE_PROVIDER_SITE_OTHER): Payer: Medicare Other | Admitting: Critical Care Medicine

## 2011-09-08 ENCOUNTER — Encounter: Payer: Self-pay | Admitting: Critical Care Medicine

## 2011-09-08 DIAGNOSIS — R0602 Shortness of breath: Secondary | ICD-10-CM

## 2011-09-08 NOTE — Patient Instructions (Signed)
No change in medications. Return in         4 months 

## 2011-09-08 NOTE — Assessment & Plan Note (Signed)
Asthmatic bronchitis with sinusitis now improved Airway obstruction and restriction on pfts 11/30 with no desat Plan Cont weight loss Cont symbicort No more pred or abx for now Get sleep study completed

## 2011-09-08 NOTE — Progress Notes (Signed)
Subjective:    Patient ID: Tracey Nguyen, female    DOB: 11-20-1940, 71 y.o.   MRN: [id number]  HPI 71 y.o.F referred for abn CXR and dyspnea  11/30 At last ov we rec: Stop lisinopril Start losartan 100mg  daily Obtain a CT Chest with contrast at [facility] CT Scanner Obtain full pulmonary function study and 6 minute walk Return for follow in [place] office after above done  Cough is more, and is looser.  Gets up mucus, white and thick.  Occ green.  Notes some L ear pain and sinus is draining.    Diet: breakfast: coffee, cheerios.  Lunch: tacos. Fast food.  Chicken noodle soup chick fil a, or fried chick fil a sandwich.    Dinner:  Same type of food.  09/08/2011 Following the diet better.  Still hoarse.  ABX and pred helped end of November.   No real wheeze.  Sl amount of cough but is better vs before   Review of Systems  Constitutional: Positive for fatigue. Negative for chills, diaphoresis, activity change, appetite change and unexpected weight change.  HENT: Positive for hearing loss, congestion, sneezing, trouble swallowing, neck stiffness, voice change, postnasal drip and ear discharge. Negative for nosebleeds, facial swelling, mouth sores, dental problem, sinus pressure and tinnitus.   Eyes: Positive for discharge and visual disturbance. Negative for photophobia and itching.  Respiratory: Positive for cough and chest tightness. Negative for apnea, choking and stridor.   Cardiovascular: Negative for palpitations.  Gastrointestinal: Positive for constipation and blood in stool. Negative for nausea and abdominal distention.       Hemorrhoids are blood in stool cause  Genitourinary: Positive for urgency. Negative for dysuria, frequency, hematuria, flank pain, decreased urine volume and difficulty urinating.  Musculoskeletal: Positive for back pain. Negative for myalgias, joint swelling, arthralgias and gait problem.  Skin: Negative for color change and pallor.  Neurological:  Positive for tremors and numbness. Negative for dizziness, seizures, syncope, speech difficulty, weakness and light-headedness.  Hematological: Negative for adenopathy. Does not bruise/bleed easily.  Psychiatric/Behavioral: Positive for sleep disturbance. Negative for confusion and agitation. The patient is not nervous/anxious.        Has sleep apnea       Objective:   Physical Exam  Filed Vitals:   09/08/11 1629  BP: 128/86  Pulse: 105  Temp: 98.7 F (37.1 C)  TempSrc: Oral  Height: 4\' 9"  (1.448 m)  Weight: 166 lb (75.297 kg)  SpO2: 91%    Gen: Pleasant, obese, in no distress,  normal affect  ENT: No lesions,  mouth clear,  oropharynx clear, purulent nares Neck: No JVD, no TMG, no carotid bruits  Lungs: No use of accessory muscles, no dullness to percussion, distant BS  Cardiovascular: RRR, heart sounds normal, no murmur or gallops, no peripheral edema  Abdomen: soft and NT, no HSM,  BS normal  Musculoskeletal: kyphoscoliosis,  no cyanosis or clubbing  Neuro: alert, non focal  Skin: Warm, no lesions or rashes           Assessment & Plan:   DYSPNEA Asthmatic bronchitis with sinusitis now improved Airway obstruction and restriction on pfts 11/30 with no desat Plan Cont weight loss Cont symbicort No more pred or abx for now Get sleep study completed     Updated Medication List Outpatient Encounter Prescriptions as of 09/08/2011  Medication Sig Dispense Refill  . albuterol-ipratropium (COMBIVENT) 18-103 MCG/ACT inhaler Inhale 2 puffs into the lungs every 4 (four) hours as needed for wheezing.  3 Inhaler  4  . budesonide-formoterol (SYMBICORT) 160-4.5 MCG/ACT inhaler Inhale 2 puffs into the lungs 2 (two) times daily.  1 Inhaler  12  . Calcium Carbonate (CALTRATE 600 PO) Take by mouth daily.        . CYMBALTA 30 MG capsule TAKE 1 CAPSULE BY MOUTH EVERY DAY  30 capsule  5  . furosemide (LASIX) 20 MG tablet Take 1 tablet (20 mg total) by mouth daily.   90 tablet  1  . losartan (COZAAR) 100 MG tablet Take 1 tablet (100 mg total) by mouth daily.  30 tablet  6  . Multiple Vitamin (MULTIVITAMIN) tablet Take 1 tablet by mouth every other day.       Marland Kitchen omeprazole (PRILOSEC) 20 MG capsule TAKE 1 CAPSULE EVERY DAY  100 capsule  2  . pramipexole (MIRAPEX) 1 MG tablet Take 1 mg by mouth daily.       . Zoledronic Acid (ZOMETA IV) Inject into the vein. Takes twice a year.       Marland Kitchen DISCONTD: predniSONE (DELTASONE) 10 MG tablet Take 4 for three days 3 for three days 2 for three days 1 for three days and stop  30 tablet  0

## 2011-10-14 ENCOUNTER — Telehealth: Payer: Self-pay | Admitting: Critical Care Medicine

## 2011-10-14 ENCOUNTER — Telehealth: Payer: Self-pay | Admitting: Family Medicine

## 2011-10-14 MED ORDER — AMOXICILLIN-POT CLAVULANATE 875-125 MG PO TABS: 1.0000 | ORAL_TABLET | Freq: Two times a day (BID) | ORAL | Status: AC

## 2011-10-14 MED ORDER — DOXYCYCLINE HYCLATE 100 MG PO TABS: 100.0000 mg | ORAL_TABLET | Freq: Two times a day (BID) | ORAL | Status: AC

## 2011-10-14 MED ORDER — HYDROCODONE-HOMATROPINE 5-1.5 MG/5ML PO SYRP: 5.0000 mL | ORAL_SOLUTION | Freq: Three times a day (TID) | ORAL | Status: AC | PRN

## 2011-10-14 NOTE — Telephone Encounter (Signed)
Called spoke with patient who c/o sinus pressure and congestion, PND, prod cough with green mucus x4-5days - denies f/c/s.  [facility] [address].  Last ov with PEW 1.30.13.  Allergies  Allergen Reactions  . Codeine     REACTION: spacey   Dr Delford Field not in office today.  Will forward to doc of the day.  Dr Maple Hudson please advise, thanks.

## 2011-10-14 NOTE — Telephone Encounter (Signed)
Called spoke with patient, advised of recs as stated by CDY.  Pt okay with this and verbalized her understanding.  rx sent to verified pharmacy.

## 2011-10-14 NOTE — Telephone Encounter (Signed)
Pt called and said that she has head and chest congestion. Green and Yellow mucous. Pt is not running a fever. Pt req a cough med and ? a [facility] [address].

## 2011-10-14 NOTE — Telephone Encounter (Signed)
Augmentin 875 mg, # 14, 1 twice daily  Mucinex and extra fluids

## 2011-10-14 NOTE — Telephone Encounter (Signed)
Fleet Contras,,,,,,,,,,, she has underlying COPD,,,,,,,,,,, doxycycline 100 mg twice a day dispense 30 no refills also please call then Hydromet 1/2-1 teaspoon 3 times a day when necessary for cough 4 ounces 1 refill

## 2011-11-02 ENCOUNTER — Other Ambulatory Visit: Payer: Self-pay | Admitting: Obstetrics & Gynecology

## 2012-01-01 ENCOUNTER — Other Ambulatory Visit: Payer: Self-pay | Admitting: Family Medicine

## 2012-01-05 ENCOUNTER — Ambulatory Visit: Payer: Medicare Other | Admitting: Critical Care Medicine

## 2012-01-15 ENCOUNTER — Other Ambulatory Visit: Payer: Self-pay | Admitting: Critical Care Medicine

## 2012-01-19 ENCOUNTER — Ambulatory Visit (INDEPENDENT_AMBULATORY_CARE_PROVIDER_SITE_OTHER): Payer: Medicare Other | Admitting: Critical Care Medicine

## 2012-01-19 ENCOUNTER — Encounter: Payer: Self-pay | Admitting: Critical Care Medicine

## 2012-01-19 VITALS — BP 118/84 | HR 112 | Temp 98.7°F | Ht <= 58 in | Wt 162.6 lb

## 2012-01-19 DIAGNOSIS — R0602 Shortness of breath: Secondary | ICD-10-CM

## 2012-01-19 DIAGNOSIS — J45909 Unspecified asthma, uncomplicated: Secondary | ICD-10-CM

## 2012-01-19 MED ORDER — LOSARTAN POTASSIUM 100 MG PO TABS: 100.0000 mg | ORAL_TABLET | Freq: Every day | ORAL | Status: DC

## 2012-01-19 NOTE — Progress Notes (Signed)
Subjective:    Patient ID: Tracey Nguyen, female    DOB: 06-09-1941, 71 y.o.   MRN: [id number]  HPI  71 y.o.F with moderate persistent asthma, allergic rhinitis, OSA, restriction d/t scoliosis  01/19/2012 Pt saw Dohmeier for OSA and cpap adjusted.  Cpap works better now Less dyspnea and cough.  No excess mucus. Pt denies any significant sore throat, nasal congestion or excess secretions, fever, chills, sweats, unintended weight loss, pleurtic or exertional chest pain, orthopnea PND, or leg swelling Pt denies any increase in rescue therapy over baseline, denies waking up needing it or having any early am or nocturnal exacerbations of coughing/wheezing/or dyspnea. Pt also denies any obvious fluctuation in symptoms with  weather or environmental change or other alleviating or aggravating factors    Review of Systems  Constitutional: Positive for fatigue. Negative for chills, diaphoresis, activity change, appetite change and unexpected weight change.  HENT: Positive for hearing loss. Negative for nosebleeds, congestion, facial swelling, sneezing, mouth sores, trouble swallowing, neck stiffness, dental problem, voice change, postnasal drip, sinus pressure, tinnitus and ear discharge.   Eyes: Positive for visual disturbance. Negative for photophobia, discharge and itching.  Respiratory: Negative for apnea, cough, choking, chest tightness and stridor.   Cardiovascular: Negative for palpitations.  Gastrointestinal: Positive for constipation and blood in stool. Negative for nausea and abdominal distention.       Hemorrhoids are blood in stool cause  Genitourinary: Positive for urgency. Negative for dysuria, frequency, hematuria, flank pain, decreased urine volume and difficulty urinating.  Musculoskeletal: Positive for back pain. Negative for myalgias, joint swelling, arthralgias and gait problem.  Skin: Negative for color change and pallor.  Neurological: Positive for tremors and numbness.  Negative for dizziness, seizures, syncope, speech difficulty, weakness and light-headedness.  Hematological: Negative for adenopathy. Does not bruise/bleed easily.  Psychiatric/Behavioral: Positive for disturbed wake/sleep cycle. Negative for confusion and agitation. The patient is not nervous/anxious.        Has sleep apnea       Objective:   Physical Exam  Filed Vitals:   01/19/12 1627  BP: 118/84  Pulse: 112  Temp: 98.7 F (37.1 C)  TempSrc: Oral  Height: 4' 9.5" (1.461 m)  Weight: 162 lb 9.6 oz (73.755 kg)  SpO2: 91%    Gen: Pleasant, obese, in no distress,  normal affect  ENT: No lesions,  mouth clear,  oropharynx clear, purulent nares Neck: No JVD, no TMG, no carotid bruits  Lungs: No use of accessory muscles, no dullness to percussion, distant BS  Cardiovascular: RRR, heart sounds normal, no murmur or gallops, no peripheral edema  Abdomen: soft and NT, no HSM,  BS normal  Musculoskeletal: kyphoscoliosis,  no cyanosis or clubbing  Neuro: alert, non focal  Skin: Warm, no lesions or rashes           Assessment & Plan:   DYSPNEA Asthmatic bronchitis with  prior sinusitis now improved Airway obstruction and restriction on pfts 11/30 with no desat Overall improved with Symbicort use There is a component of restriction on the basis of obesity Notes sleep study showed controlled sleep apnea with adjusted CPAP settings that is being managed by a local neurologist  Plan Continue Symbicort Continue CPAP therapy Focus on weight loss Return 4 months  ASTHMA Asthmatic bronchitis with  prior sinusitis now improved Airway obstruction and restriction on pfts 11/30 with no desat Overall improved with Symbicort use There is a component of restriction on the basis of obesity Notes sleep study showed  controlled sleep apnea with adjusted CPAP settings that is being managed by a local neurologist  Plan Continue Symbicort Continue CPAP therapy Focus on  weight loss Return 4 months    Updated Medication List Outpatient Encounter Prescriptions as of 01/19/2012  Medication Sig Dispense Refill  . albuterol-ipratropium (COMBIVENT) 18-103 MCG/ACT inhaler Inhale 2 puffs into the lungs every 4 (four) hours as needed for wheezing.  3 Inhaler  4  . budesonide-formoterol (SYMBICORT) 160-4.5 MCG/ACT inhaler Inhale 2 puffs into the lungs 2 (two) times daily.  1 Inhaler  12  . Calcium Carbonate (CALTRATE 600 PO) Take by mouth daily.        . CYMBALTA 30 MG capsule TAKE 1 CAPSULE BY MOUTH EVERY DAY  30 capsule  5  . furosemide (LASIX) 20 MG tablet Take 1 tablet (20 mg total) by mouth daily.  90 tablet  1  . losartan (COZAAR) 100 MG tablet Take 1 tablet (100 mg total) by mouth daily.  30 tablet  11  . Multiple Vitamin (MULTIVITAMIN) tablet Take 1 tablet by mouth every other day.       Marland Kitchen omeprazole (PRILOSEC) 20 MG capsule TAKE 1 CAPSULE EVERY DAY  100 capsule  2  . pramipexole (MIRAPEX) 1 MG tablet Take 1 mg by mouth daily.       Marland Kitchen DISCONTD: losartan (COZAAR) 100 MG tablet Take 1 tablet (100 mg total) by mouth daily.  30 tablet  6  . Zoledronic Acid (ZOMETA IV) Inject into the vein. Takes twice a year.

## 2012-01-19 NOTE — Patient Instructions (Addendum)
No change in medications. Return in         4 months 

## 2012-01-20 NOTE — Assessment & Plan Note (Signed)
Asthmatic bronchitis with  prior sinusitis now improved Airway obstruction and restriction on pfts 11/30 with no desat Overall improved with Symbicort use There is a component of restriction on the basis of obesity Notes sleep study showed controlled sleep apnea with adjusted CPAP settings that is being managed by a local neurologist  Plan Continue Symbicort Continue CPAP therapy Focus on weight loss Return 4 months

## 2012-01-20 NOTE — Assessment & Plan Note (Signed)
Asthmatic bronchitis with  prior sinusitis now improved Airway obstruction and restriction on pfts 11/30 6MW with no desat Overall improved with Symbicort use There is a component of restriction on the basis of obesity Notes sleep study showed controlled sleep apnea with adjusted CPAP settings that is being managed by a local neurologist  Plan Continue Symbicort Continue CPAP therapy Focus on weight loss Return 4 months 

## 2012-03-14 ENCOUNTER — Other Ambulatory Visit: Payer: Self-pay | Admitting: Obstetrics & Gynecology

## 2012-03-14 DIAGNOSIS — Z1231 Encounter for screening mammogram for malignant neoplasm of breast: Secondary | ICD-10-CM

## 2012-03-14 DIAGNOSIS — Z9011 Acquired absence of right breast and nipple: Secondary | ICD-10-CM

## 2012-03-24 ENCOUNTER — Other Ambulatory Visit: Payer: Self-pay | Admitting: Family Medicine

## 2012-03-30 ENCOUNTER — Ambulatory Visit
Admission: RE | Admit: 2012-03-30 | Discharge: 2012-03-30 | Disposition: A | Discharge status: Home / Self Care | Payer: Medicare Other | Source: Ambulatory Visit | Attending: Obstetrics & Gynecology | Admitting: Obstetrics & Gynecology

## 2012-03-30 DIAGNOSIS — Z1231 Encounter for screening mammogram for malignant neoplasm of breast: Secondary | ICD-10-CM

## 2012-03-30 DIAGNOSIS — Z9011 Acquired absence of right breast and nipple: Secondary | ICD-10-CM

## 2012-04-01 ENCOUNTER — Other Ambulatory Visit: Payer: Self-pay | Admitting: Family Medicine

## 2012-05-22 ENCOUNTER — Encounter: Payer: Self-pay | Admitting: Critical Care Medicine

## 2012-05-22 ENCOUNTER — Ambulatory Visit (INDEPENDENT_AMBULATORY_CARE_PROVIDER_SITE_OTHER): Payer: Medicare Other | Admitting: Critical Care Medicine

## 2012-05-22 VITALS — BP 130/98 | HR 113 | Temp 98.3°F | Ht <= 58 in | Wt 160.0 lb

## 2012-05-22 DIAGNOSIS — J45909 Unspecified asthma, uncomplicated: Secondary | ICD-10-CM

## 2012-05-22 MED ORDER — PREDNISONE 10 MG PO TABS: ORAL_TABLET | ORAL | Status: DC

## 2012-05-22 NOTE — Progress Notes (Signed)
Subjective:    Patient ID: Tracey Nguyen, female    DOB: [birth year], 71 y.o.   MRN: [id number]  HPI  71 y.o.F with moderate persistent asthma, allergic rhinitis, OSA, restriction d/t scoliosis   05/22/2012 Pt notes more dyspnea since 6/13.  Seems worse three weeks ago. Pt notes more pndrip.  Notes more sneezing.  Pt notes more cough.  Notes more dyspnea with exertion.  88% on arrival , in past she passed without desaturation..  Pt is on 2Lcpap 14cmh20 at home. Mucus is yellow.   Past Medical History  Diagnosis Date  . Asthma   . Arthritis   . Cancer 2007    breast cancer  . GERD (gastroesophageal reflux disease)   . Hypertension   . Osteoporosis   . Sleep apnea     on cpap  . Blood transfusion   . Cataract     BILATERAL -REMOVED VIA SURGERY  . History of D&C      No family history on file.   History   Social History  . Marital Status: Married    Spouse Name: N/A    Number of Children: N/A  . Years of Education: N/A   Occupational History  . Not on file.   Social History Main Topics  . Smoking status: Never Smoker   . Smokeless tobacco: Never Used  . Alcohol Use: No  . Drug Use: No  . Sexually Active: Not on file   Other Topics Concern  . Not on file   Social History Narrative  . No narrative on file     Allergies  Allergen Reactions  . Codeine     REACTION: spacey     Outpatient Prescriptions Prior to Visit  Medication Sig Dispense Refill  . albuterol-ipratropium (COMBIVENT) 18-103 MCG/ACT inhaler Inhale 2 puffs into the lungs every 4 (four) hours as needed for wheezing.  3 Inhaler  4  . budesonide-formoterol (SYMBICORT) 160-4.5 MCG/ACT inhaler Inhale 2 puffs into the lungs 2 (two) times daily.  1 Inhaler  12  . Calcium Carbonate (CALTRATE 600 PO) Take by mouth daily.        . CYMBALTA 30 MG capsule TAKE 1 CAPSULE BY MOUTH EVERY DAY  30 capsule  5  . furosemide (LASIX) 20 MG tablet TAKE 1 TABLET BY MOUTH EVERY DAY  90 tablet  1  .  losartan (COZAAR) 100 MG tablet Take 1 tablet (100 mg total) by mouth daily.  30 tablet  11  . Multiple Vitamin (MULTIVITAMIN) tablet Take 1 tablet by mouth every other day.       Marland Kitchen omeprazole (PRILOSEC) 20 MG capsule TAKE 1 CAPSULE EVERY DAY  100 capsule  0  . pramipexole (MIRAPEX) 1 MG tablet Take 1 mg by mouth daily.       . Zoledronic Acid (ZOMETA IV) Inject into the vein. ON HOLD until sees Dr. Tawanna Cooler          Review of Systems  Constitutional: Positive for fatigue. Negative for chills, diaphoresis, activity change, appetite change and unexpected weight change.  HENT: Positive for hearing loss. Negative for nosebleeds, congestion, facial swelling, sneezing, mouth sores, trouble swallowing, neck stiffness, dental problem, voice change, postnasal drip, sinus pressure, tinnitus and ear discharge.   Eyes: Positive for visual disturbance. Negative for photophobia, discharge and itching.  Respiratory: Negative for apnea, cough, choking, chest tightness and stridor.   Cardiovascular: Negative for palpitations.  Gastrointestinal: Positive for constipation and blood in stool. Negative for nausea and abdominal  distention.       Hemorrhoids are blood in stool cause  Genitourinary: Positive for urgency. Negative for dysuria, frequency, hematuria, flank pain, decreased urine volume and difficulty urinating.  Musculoskeletal: Positive for back pain. Negative for myalgias, joint swelling, arthralgias and gait problem.  Skin: Negative for color change and pallor.  Neurological: Positive for tremors and numbness. Negative for dizziness, seizures, syncope, speech difficulty, weakness and light-headedness.  Hematological: Negative for adenopathy. Does not bruise/bleed easily.  Psychiatric/Behavioral: Positive for disturbed wake/sleep cycle. Negative for confusion and agitation. The patient is not nervous/anxious.        Has sleep apnea       Objective:   Physical Exam  Filed Vitals:   05/22/12 1445  05/22/12 1448  BP:  130/98  Pulse:  113  Temp:  98.3 F (36.8 C)  TempSrc:  Oral  Height:  4\' 9"  (1.448 m)  Weight:  160 lb (72.576 kg)  SpO2: 88% 91%    Gen: Pleasant, obese, in no distress,  normal affect  ENT: No lesions,  mouth clear,  oropharynx clear, purulent nares Neck: No JVD, no TMG, no carotid bruits  Lungs: No use of accessory muscles, no dullness to percussion, distant BS  Cardiovascular: RRR, heart sounds normal, no murmur or gallops, no peripheral edema  Abdomen: soft and NT, no HSM,  BS normal  Musculoskeletal: kyphoscoliosis,  no cyanosis or clubbing  Neuro: alert, non focal  Skin: Warm, no lesions or rashes      Assessment & Plan:   Extrinsic asthma, unspecified Moderate persistent asthma with flare due to allergic factors no evidence of sinusitis at this time Plan  pulse prednisone 40 mg daily with taper over 8 days continue Symbicort note HFA technique is improved     Updated Medication List Outpatient Encounter Prescriptions as of 05/22/2012  Medication Sig Dispense Refill  . albuterol-ipratropium (COMBIVENT) 18-103 MCG/ACT inhaler Inhale 2 puffs into the lungs every 4 (four) hours as needed for wheezing.  3 Inhaler  4  . budesonide-formoterol (SYMBICORT) 160-4.5 MCG/ACT inhaler Inhale 2 puffs into the lungs 2 (two) times daily.  1 Inhaler  12  . Calcium Carbonate (CALTRATE 600 PO) Take by mouth daily.        . CYMBALTA 30 MG capsule TAKE 1 CAPSULE BY MOUTH EVERY DAY  30 capsule  5  . furosemide (LASIX) 20 MG tablet TAKE 1 TABLET BY MOUTH EVERY DAY  90 tablet  1  . losartan (COZAAR) 100 MG tablet Take 1 tablet (100 mg total) by mouth daily.  30 tablet  11  . Multiple Vitamin (MULTIVITAMIN) tablet Take 1 tablet by mouth every other day.       . Naproxen Sodium (ALEVE) 220 MG CAPS Take 440 mg by mouth 2 (two) times daily.      Marland Kitchen omeprazole (PRILOSEC) 20 MG capsule TAKE 1 CAPSULE EVERY DAY  100 capsule  0  . pramipexole (MIRAPEX) 1 MG tablet Take  1 mg by mouth daily.       . predniSONE (DELTASONE) 10 MG tablet Take 4 for two days three for two days two for two days one for two days  20 tablet  0  . DISCONTD: Zoledronic Acid (ZOMETA IV) Inject into the vein. ON HOLD until sees Dr. Tawanna Cooler

## 2012-05-22 NOTE — Assessment & Plan Note (Signed)
Moderate persistent asthma with flare due to allergic factors no evidence of sinusitis at this time Plan  pulse prednisone 40 mg daily with taper over 8 days continue Symbicort note HFA technique is improved

## 2012-05-22 NOTE — Patient Instructions (Addendum)
Prednisone 10mg  Take 4 for two days three for two days two for two days one for two days STay on symbicort two puff twice daily Return 4 months, sooner if unimproving

## 2012-07-03 ENCOUNTER — Other Ambulatory Visit: Payer: Self-pay | Admitting: Family Medicine

## 2012-07-12 ENCOUNTER — Other Ambulatory Visit: Payer: Self-pay | Admitting: Critical Care Medicine

## 2012-07-12 ENCOUNTER — Other Ambulatory Visit: Payer: Self-pay | Admitting: Family Medicine

## 2012-07-13 NOTE — Telephone Encounter (Signed)
Tried calling the pt to see if she needs this medication refilled and why. LM for her to call back.

## 2012-07-14 ENCOUNTER — Telehealth: Payer: Self-pay | Admitting: Critical Care Medicine

## 2012-07-14 MED ORDER — PREDNISONE 10 MG PO TABS: ORAL_TABLET | ORAL | Status: DC

## 2012-07-14 NOTE — Telephone Encounter (Signed)
Pt c/i increased sob and wheezing for 1 week.  Dry cough with some chest tightness.  Pt is requesting rx for Prednisone.  Please advise. Last ov:  05-22-12   Next ov:  None scheduled

## 2012-07-14 NOTE — Telephone Encounter (Signed)
Call in prednisone 10mg  Take 4 for three days 3 for three days 2 for three days 1 for three days and stop  #30  Needs OV soon. TP ok

## 2012-07-14 NOTE — Telephone Encounter (Signed)
Pt informed that rx for Prednisone was sent to pharmacy.  OV scheduled for Dr Delford Field 07-26-12.

## 2012-07-26 ENCOUNTER — Ambulatory Visit (INDEPENDENT_AMBULATORY_CARE_PROVIDER_SITE_OTHER): Payer: Medicare Other | Admitting: Critical Care Medicine

## 2012-07-26 ENCOUNTER — Encounter: Payer: Self-pay | Admitting: Critical Care Medicine

## 2012-07-26 VITALS — BP 112/62 | HR 82 | Temp 98.0°F | Ht <= 58 in | Wt 155.0 lb

## 2012-07-26 DIAGNOSIS — J45902 Unspecified asthma with status asthmaticus: Secondary | ICD-10-CM

## 2012-07-26 MED ORDER — AZITHROMYCIN 250 MG PO TABS: 250.0000 mg | ORAL_TABLET | Freq: Every day | ORAL | Status: DC

## 2012-07-26 MED ORDER — AEROCHAMBER MV MISC: Status: DC

## 2012-07-26 MED ORDER — BUDESONIDE-FORMOTEROL FUMARATE 160-4.5 MCG/ACT IN AERO: 2.0000 | INHALATION_SPRAY | Freq: Two times a day (BID) | RESPIRATORY_TRACT | Status: DC

## 2012-07-26 NOTE — Assessment & Plan Note (Signed)
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

## 2012-07-26 NOTE — Patient Instructions (Addendum)
Use Aerochamber with symbicort, work on technique Azithromycin sent to pharmacy Return 3 months, call if unimproving for more prednisone

## 2012-07-26 NOTE — Progress Notes (Signed)
Subjective:    Patient ID: Tracey Nguyen, female    DOB: Jun 14, 1941, 71 y.o.   MRN: [id number]  HPI  71 y.o.F with moderate persistent asthma, allergic rhinitis, OSA, restriction d/t scoliosis   05/22/2012 Pt notes more dyspnea since 6/13.  Seems worse three weeks ago. Pt notes more pndrip.  Notes more sneezing.  Pt notes more cough.  Notes more dyspnea with exertion.  88% on arrival , in past she passed without desaturation..  Pt is on 2Lcpap 14cmh20 at home. Mucus is yellow.  07/26/2012 At last ov we Rx pred pulse and symbicort Since then pt ill again and rx on 12/6: another pred round  Pt had chest tightness, more dyspnea. Pt noted more cough, no real mucus production. After on pred again, more mucus and was light green.  Notes some sinus drainage. Pt notes more wheeze.  Pt still with dyspnea but no longer tight in the chest area. No f/c/s.  No heartburn Past Medical History  Diagnosis Date  . Asthma   . Arthritis   . Cancer 2007    breast cancer  . GERD (gastroesophageal reflux disease)   . Hypertension   . Osteoporosis   . Sleep apnea     on cpap  . Blood transfusion   . Cataract     BILATERAL -REMOVED VIA SURGERY  . History of D&C      No family history on file.   History   Social History  . Marital Status: Married    Spouse Name: N/A    Number of Children: N/A  . Years of Education: N/A   Occupational History  . Not on file.   Social History Main Topics  . Smoking status: Never Smoker   . Smokeless tobacco: Never Used  . Alcohol Use: No  . Drug Use: No  . Sexually Active: Not on file   Other Topics Concern  . Not on file   Social History Narrative  . No narrative on file     Allergies  Allergen Reactions  . Codeine     REACTION: spacey     Outpatient Prescriptions Prior to Visit  Medication Sig Dispense Refill  . albuterol-ipratropium (COMBIVENT) 18-103 MCG/ACT inhaler Inhale 2 puffs into the lungs every 4 (four) hours as needed  for wheezing.  3 Inhaler  4  . Calcium Carbonate (CALTRATE 600 PO) Take by mouth daily.        . CYMBALTA 30 MG capsule TAKE 1 CAPSULE BY MOUTH EVERY DAY  30 capsule  0  . furosemide (LASIX) 20 MG tablet TAKE 1 TABLET BY MOUTH EVERY DAY  90 tablet  1  . losartan (COZAAR) 100 MG tablet Take 1 tablet (100 mg total) by mouth daily.  30 tablet  11  . Multiple Vitamin (MULTIVITAMIN) tablet Take 1 tablet by mouth every other day.       . Naproxen Sodium (ALEVE) 220 MG CAPS Take 440 mg by mouth 2 (two) times daily.      Marland Kitchen omeprazole (PRILOSEC) 20 MG capsule TAKE 1 CAPSULE EVERY DAY  90 capsule  0  . pramipexole (MIRAPEX) 1 MG tablet Take 1 mg by mouth daily.       . [DISCONTINUED] budesonide-formoterol (SYMBICORT) 160-4.5 MCG/ACT inhaler Inhale 2 puffs into the lungs 2 (two) times daily.  1 Inhaler  12  . [DISCONTINUED] predniSONE (DELTASONE) 10 MG tablet Take 4 for three days,  three for three days,  two for three days,  one  for three days then stop.  30 tablet  0  Last reviewed on 07/26/2012  3:45 PM by Storm Frisk, MD    Review of Systems  Constitutional: Positive for fatigue. Negative for chills, diaphoresis, activity change, appetite change and unexpected weight change.  HENT: Positive for hearing loss. Negative for nosebleeds, congestion, facial swelling, sneezing, mouth sores, trouble swallowing, neck stiffness, dental problem, voice change, postnasal drip, sinus pressure, tinnitus and ear discharge.   Eyes: Positive for visual disturbance. Negative for photophobia, discharge and itching.  Respiratory: Negative for apnea, cough, choking, chest tightness and stridor.   Cardiovascular: Negative for palpitations.  Gastrointestinal: Positive for constipation and blood in stool. Negative for nausea and abdominal distention.       Hemorrhoids are blood in stool cause  Genitourinary: Positive for urgency. Negative for dysuria, frequency, hematuria, flank pain, decreased urine volume and  difficulty urinating.  Musculoskeletal: Positive for back pain. Negative for myalgias, joint swelling, arthralgias and gait problem.  Skin: Negative for color change and pallor.  Neurological: Positive for tremors and numbness. Negative for dizziness, seizures, syncope, speech difficulty, weakness and light-headedness.  Hematological: Negative for adenopathy. Does not bruise/bleed easily.  Psychiatric/Behavioral: Positive for sleep disturbance. Negative for confusion and agitation. The patient is not nervous/anxious.        Has sleep apnea       Objective:   Physical Exam  Filed Vitals:   07/26/12 1538  BP: 112/62  Pulse: 82  Temp: 98 F (36.7 C)  TempSrc: Oral  Height: 4\' 9"  (1.448 m)  Weight: 155 lb (70.308 kg)  SpO2: 96%    Gen: Pleasant, obese, in no distress,  normal affect  ENT: No lesions,  mouth clear,  oropharynx clear, purulent nares Neck: No JVD, no TMG, no carotid bruits  Lungs: No use of accessory muscles, no dullness to percussion, distant BS  Cardiovascular: RRR, heart sounds normal, no murmur or gallops, no peripheral edema  Abdomen: soft and NT, no HSM,  BS normal  Musculoskeletal: kyphoscoliosis,  no cyanosis or clubbing  Neuro: alert, non focal  Skin: Warm, no lesions or rashes      Assessment & Plan:   Moderate persistent asthma with allergic factors Asthmatic bronchitis with prior sinusitis now with recurrent flare due to tracheobronchitis Poor HFA technique Plan Use Aerochamber with symbicort, work on technique Azithromycin sent to pharmacy Return 3 months, call if unimproving for more prednisone     Updated Medication List Outpatient Encounter Prescriptions as of 07/26/2012  Medication Sig Dispense Refill  . albuterol-ipratropium (COMBIVENT) 18-103 MCG/ACT inhaler Inhale 2 puffs into the lungs every 4 (four) hours as needed for wheezing.  3 Inhaler  4  . budesonide-formoterol (SYMBICORT) 160-4.5 MCG/ACT inhaler Inhale 2 puffs into  the lungs 2 (two) times daily.  1 Inhaler  12  . Calcium Carbonate (CALTRATE 600 PO) Take by mouth daily.        . CYMBALTA 30 MG capsule TAKE 1 CAPSULE BY MOUTH EVERY DAY  30 capsule  0  . furosemide (LASIX) 20 MG tablet TAKE 1 TABLET BY MOUTH EVERY DAY  90 tablet  1  . losartan (COZAAR) 100 MG tablet Take 1 tablet (100 mg total) by mouth daily.  30 tablet  11  . Multiple Vitamin (MULTIVITAMIN) tablet Take 1 tablet by mouth every other day.       . Naproxen Sodium (ALEVE) 220 MG CAPS Take 440 mg by mouth 2 (two) times daily.      Marland Kitchen  omeprazole (PRILOSEC) 20 MG capsule TAKE 1 CAPSULE EVERY DAY  90 capsule  0  . pramipexole (MIRAPEX) 1 MG tablet Take 1 mg by mouth daily.       . [DISCONTINUED] budesonide-formoterol (SYMBICORT) 160-4.5 MCG/ACT inhaler Inhale 2 puffs into the lungs 2 (two) times daily.  1 Inhaler  12  . azithromycin (ZITHROMAX) 250 MG tablet Take 1 tablet (250 mg total) by mouth daily. Take two once then one daily until gone  6 each  0  . Spacer/Aero-Holding Chambers (AEROCHAMBER MV) inhaler Use as instructed  1 each  0  . [DISCONTINUED] predniSONE (DELTASONE) 10 MG tablet Take 4 for three days,  three for three days,  two for three days,  one for three days then stop.  30 tablet  0

## 2012-07-31 ENCOUNTER — Other Ambulatory Visit: Payer: Self-pay | Admitting: Family Medicine

## 2012-08-04 ENCOUNTER — Telehealth: Payer: Self-pay | Admitting: Critical Care Medicine

## 2012-08-04 MED ORDER — PREDNISONE 10 MG PO TABS: ORAL_TABLET | ORAL | Status: DC

## 2012-08-04 NOTE — Telephone Encounter (Signed)
I spoke with pt and notified of recs per PW She verbalized understanding and denied any questions I advised that she call back if not improving Rx was sent to [facility]

## 2012-08-04 NOTE — Telephone Encounter (Signed)
Spoke with patient, patient states after last OV 07/26/12 she started having more wheezing and chest tightness and has still been going on x1 week.  Patient is requesting some more prednisone to be sent in to pharmacy. Dr. Delford Field please advise, thank you  [facility]   Last OV: Patient Instructions     Use Aerochamber with symbicort, work on technique  Azithromycin sent to pharmacy  Return 3 months, call if unimproving for more prednisone

## 2012-08-04 NOTE — Telephone Encounter (Signed)
Call in prednisone 10mg Take 4 for two days three for two days two for two days one for two days  #20 

## 2012-09-01 ENCOUNTER — Telehealth: Payer: Self-pay | Admitting: Critical Care Medicine

## 2012-09-01 MED ORDER — PREDNISONE 10 MG PO TABS: ORAL_TABLET | ORAL | Status: DC

## 2012-09-01 NOTE — Telephone Encounter (Signed)
Call pred pulse 10mg  Take 4 for three days 3 for three days 2 for three days 1 for three days and stop  #30 Needs OV next week with any provider or TP

## 2012-09-01 NOTE — Telephone Encounter (Signed)
Called and spoke with pt and she is aware of PW recs to start on the pred taper and i have scheduled her an appt on 1/29 at 10:30 with PW.  Pt voiced her understanding of these recs and nothing further is needed.

## 2012-09-01 NOTE — Telephone Encounter (Signed)
Spoke with pt She is c/o chest tightness, increased SOB, and dry cough x 1 wk She is using her rescue inhaler at least twice per day Denies any f/c/s, sore throat, aches, HA, chest pain I offered ov and she refused Requests pred taper Please advise, thanks!

## 2012-09-06 ENCOUNTER — Ambulatory Visit (INDEPENDENT_AMBULATORY_CARE_PROVIDER_SITE_OTHER): Payer: Medicare Other | Admitting: Critical Care Medicine

## 2012-09-06 ENCOUNTER — Ambulatory Visit (INDEPENDENT_AMBULATORY_CARE_PROVIDER_SITE_OTHER)
Admission: RE | Admit: 2012-09-06 | Discharge: 2012-09-06 | Disposition: A | Discharge status: Home / Self Care | Payer: Medicare Other | Source: Ambulatory Visit | Attending: Critical Care Medicine | Admitting: Critical Care Medicine

## 2012-09-06 ENCOUNTER — Encounter: Payer: Self-pay | Admitting: Critical Care Medicine

## 2012-09-06 VITALS — BP 152/100 | HR 104 | Temp 98.3°F | Ht <= 58 in | Wt 163.0 lb

## 2012-09-06 DIAGNOSIS — J45902 Unspecified asthma with status asthmaticus: Secondary | ICD-10-CM

## 2012-09-06 DIAGNOSIS — I10 Essential (primary) hypertension: Secondary | ICD-10-CM

## 2012-09-06 DIAGNOSIS — R0600 Dyspnea, unspecified: Secondary | ICD-10-CM

## 2012-09-06 DIAGNOSIS — IMO0002 Reserved for concepts with insufficient information to code with codable children: Secondary | ICD-10-CM

## 2012-09-06 DIAGNOSIS — R0609 Other forms of dyspnea: Secondary | ICD-10-CM

## 2012-09-06 DIAGNOSIS — J811 Chronic pulmonary edema: Secondary | ICD-10-CM

## 2012-09-06 MED ORDER — NEBIVOLOL HCL 5 MG PO TABS: 5.0000 mg | ORAL_TABLET | Freq: Every day | ORAL | Status: DC

## 2012-09-06 MED ORDER — FUROSEMIDE 40 MG PO TABS: 40.0000 mg | ORAL_TABLET | Freq: Every day | ORAL | Status: DC

## 2012-09-06 NOTE — Assessment & Plan Note (Signed)
Hypertension poorly controlled with associated fluid retention and mild pulmonary edema with associated increased dyspnea. All of this on the basis of side effects due to corticosteroid use as well as intrinsic hypertension.  Plan Obtain echocardiogram to evaluate LV function Increase Lasix to 40mg  daily Start bystolic 5mg  daily Stop prednisone Stay on symbicort Return 1 week for recheck

## 2012-09-06 NOTE — Assessment & Plan Note (Signed)
No evidence of asthma flare on this visit. Corticosteroids likely exacerbating fluid retention and hypertension Plan Review hypertension assessment

## 2012-09-06 NOTE — Progress Notes (Signed)
Subjective:    Patient ID: Tracey Nguyen, female    DOB: 04/10/1941, 72 y.o.   MRN: [id number]  HPI 72 y.o.F with moderate persistent asthma, allergic rhinitis, OSA, restriction d/t scoliosis   05/22/2012 Pt notes more dyspnea since 6/13.  Seems worse three weeks ago. Pt notes more pndrip.  Notes more sneezing.  Pt notes more cough.  Notes more dyspnea with exertion.  88% on arrival , in past she passed without desaturation..  Pt is on 2Lcpap 14cmh20 at home. Mucus is yellow.  07/26/2012 At last ov we Rx pred pulse and symbicort Since then pt ill again and rx on 12/6: another pred round  Pt had chest tightness, more dyspnea. Pt noted more cough, no real mucus production. After on pred again, more mucus and was light green.  Notes some sinus drainage. Pt notes more wheeze.  Pt still with dyspnea but no longer tight in the chest area. No f/c/s.  No heartburn  09/06/2012 PT rx pred pulse over the phone but not improved. If walks across the floor will pant.  Pt notes tremors.  Notes some edema. No mucus now, cough is tight,  Chest is very tight.  If blow nose is white to green.  Notes some sinus pressure and over L eye hurts. Pt notes pndrip.    Past Medical History  Diagnosis Date  . Asthma   . Arthritis   . Cancer 2007    breast cancer  . GERD (gastroesophageal reflux disease)   . Hypertension   . Osteoporosis   . Sleep apnea     on cpap  . Blood transfusion   . Cataract     BILATERAL -REMOVED VIA SURGERY  . History of D&C      No family history on file.   History   Social History  . Marital Status: Married    Spouse Name: N/A    Number of Children: N/A  . Years of Education: N/A   Occupational History  . Not on file.   Social History Main Topics  . Smoking status: Never Smoker   . Smokeless tobacco: Never Used  . Alcohol Use: No  . Drug Use: No  . Sexually Active: Not on file   Other Topics Concern  . Not on file   Social History Narrative    . No narrative on file     Allergies  Allergen Reactions  . Codeine     REACTION: spacey     Outpatient Prescriptions Prior to Visit  Medication Sig Dispense Refill  . albuterol-ipratropium (COMBIVENT) 18-103 MCG/ACT inhaler Inhale 2 puffs into the lungs every 4 (four) hours as needed for wheezing.  3 Inhaler  4  . budesonide-formoterol (SYMBICORT) 160-4.5 MCG/ACT inhaler Inhale 2 puffs into the lungs 2 (two) times daily.  1 Inhaler  12  . Calcium Carbonate (CALTRATE 600 PO) Take by mouth daily.        Marland Kitchen losartan (COZAAR) 100 MG tablet Take 1 tablet (100 mg total) by mouth daily.  30 tablet  11  . Multiple Vitamin (MULTIVITAMIN) tablet Take 1 tablet by mouth every other day.       . Naproxen Sodium (ALEVE) 220 MG CAPS Take 440 mg by mouth 2 (two) times daily.      Marland Kitchen omeprazole (PRILOSEC) 20 MG capsule TAKE 1 CAPSULE EVERY DAY  90 capsule  0  . pramipexole (MIRAPEX) 1 MG tablet Take 1 mg by mouth daily.       Marland Kitchen  Spacer/Aero-Holding Chambers (AEROCHAMBER MV) inhaler Use as instructed  1 each  0  . [DISCONTINUED] furosemide (LASIX) 20 MG tablet TAKE 1 TABLET BY MOUTH EVERY DAY  90 tablet  1  . [DISCONTINUED] azithromycin (ZITHROMAX) 250 MG tablet Take 1 tablet (250 mg total) by mouth daily. Take two once then one daily until gone  6 each  0  . [DISCONTINUED] CYMBALTA 30 MG capsule TAKE 1 CAPSULE BY MOUTH ONCE DAILY *PT NEEDS OFFICE VISIT FOR MORE REFILLS*  15 capsule  0  . [DISCONTINUED] predniSONE (DELTASONE) 10 MG tablet Take 4 tabs x 3 days, 2 tabs x 3 days, 1 tab x 3 days then stop  30 tablet  0  Last reviewed on 09/06/2012 10:30 AM by Storm Frisk, MD    Review of Systems  Constitutional: Positive for fatigue. Negative for chills, diaphoresis, activity change and unexpected weight change.  HENT: Positive for hearing loss. Negative for nosebleeds, congestion, facial swelling, mouth sores, neck stiffness, dental problem, voice change, sinus pressure, tinnitus and ear discharge.    Eyes: Positive for visual disturbance. Negative for photophobia, discharge and itching.  Respiratory: Negative for apnea, choking, chest tightness and stridor.   Cardiovascular: Negative for palpitations.  Gastrointestinal: Positive for constipation and blood in stool. Negative for nausea and abdominal distention.       Hemorrhoids are blood in stool cause  Genitourinary: Positive for urgency. Negative for dysuria, frequency, hematuria, flank pain, decreased urine volume and difficulty urinating.  Musculoskeletal: Positive for back pain. Negative for joint swelling, arthralgias and gait problem.  Skin: Negative for color change and pallor.  Neurological: Positive for tremors and numbness. Negative for dizziness, seizures, syncope, speech difficulty, weakness and light-headedness.  Hematological: Negative for adenopathy. Does not bruise/bleed easily.  Psychiatric/Behavioral: Positive for sleep disturbance. Negative for confusion and agitation. The patient is not nervous/anxious.        Has sleep apnea       Objective:   Physical Exam  Filed Vitals:   09/06/12 1023  BP: 152/100  Pulse: 104  Temp: 98.3 F (36.8 C)  TempSrc: Oral  Height: 4\' 10"  (1.473 m)  Weight: 163 lb (73.936 kg)  SpO2: 92%    Gen: Pleasant, obese, in no distress,  normal affect  ENT: No lesions,  mouth clear,  oropharynx clear, purulent nares Neck: No JVD, no TMG, no carotid bruits  Lungs: No use of accessory muscles, no dullness to percussion, distant BS, bibasilar rales  Cardiovascular: RRR, heart sounds normal, no murmur or gallops, ++++ peripheral edema  Abdomen: soft and NT, no HSM,  BS normal  Musculoskeletal: kyphoscoliosis,  no cyanosis or clubbing  Neuro: alert, non focal  Skin: Warm, no lesions or rashes   Dg Chest 2 View  09/06/2012  *RADIOLOGY REPORT*  Clinical Data: Shortness of breath, hypertension, asthma.  History of breast cancer.  CHEST - 2 VIEW  Comparison: 06/15/2011  Findings:  Severe thoracolumbar scoliosis. Stable linear opacities within the lingula, likely scarring.  No confluent opacity on the right.  Heart is normal size.  No effusions or acute bony abnormality.  IMPRESSION: Lingular scarring.  No acute cardiopulmonary disease.   Original Report Authenticated By: Charlett Nose, M.D.        Assessment & Plan:   HYPERTENSION NEC Hypertension poorly controlled with associated fluid retention and mild pulmonary edema with associated increased dyspnea. All of this on the basis of side effects due to corticosteroid use as well as intrinsic hypertension.  Plan Obtain echocardiogram  to evaluate LV function Increase Lasix to 40mg  daily Start bystolic 5mg  daily Stop prednisone Stay on symbicort Return 1 week for recheck   Moderate persistent asthma with allergic factors No evidence of asthma flare on this visit. Corticosteroids likely exacerbating fluid retention and hypertension Plan Review hypertension assessment    Updated Medication List Outpatient Encounter Prescriptions as of 09/06/2012  Medication Sig Dispense Refill  . albuterol-ipratropium (COMBIVENT) 18-103 MCG/ACT inhaler Inhale 2 puffs into the lungs every 4 (four) hours as needed for wheezing.  3 Inhaler  4  . budesonide-formoterol (SYMBICORT) 160-4.5 MCG/ACT inhaler Inhale 2 puffs into the lungs 2 (two) times daily.  1 Inhaler  12  . Calcium Carbonate (CALTRATE 600 PO) Take by mouth daily.        . furosemide (LASIX) 40 MG tablet Take 1 tablet (40 mg total) by mouth daily.  60 tablet  6  . losartan (COZAAR) 100 MG tablet Take 1 tablet (100 mg total) by mouth daily.  30 tablet  11  . Multiple Vitamin (MULTIVITAMIN) tablet Take 1 tablet by mouth every other day.       . Naproxen Sodium (ALEVE) 220 MG CAPS Take 440 mg by mouth 2 (two) times daily.      Marland Kitchen omeprazole (PRILOSEC) 20 MG capsule TAKE 1 CAPSULE EVERY DAY  90 capsule  0  . pramipexole (MIRAPEX) 1 MG tablet Take 1 mg by mouth daily.       Marland Kitchen  Spacer/Aero-Holding Chambers (AEROCHAMBER MV) inhaler Use as instructed  1 each  0  . [DISCONTINUED] furosemide (LASIX) 20 MG tablet TAKE 1 TABLET BY MOUTH EVERY DAY  90 tablet  1  . nebivolol (BYSTOLIC) 5 MG tablet Take 1 tablet (5 mg total) by mouth daily.  30 tablet  6  . [DISCONTINUED] azithromycin (ZITHROMAX) 250 MG tablet Take 1 tablet (250 mg total) by mouth daily. Take two once then one daily until gone  6 each  0  . [DISCONTINUED] CYMBALTA 30 MG capsule TAKE 1 CAPSULE BY MOUTH ONCE DAILY *PT NEEDS OFFICE VISIT FOR MORE REFILLS*  15 capsule  0  . [DISCONTINUED] predniSONE (DELTASONE) 10 MG tablet Take 4 tabs x 3 days, 2 tabs x 3 days, 1 tab x 3 days then stop  30 tablet  0

## 2012-09-06 NOTE — Patient Instructions (Addendum)
A heart ultrasound will be obtained An arterial blood gas will be obtained Increase Lasix to 40mg  daily Start bystolic 5mg  daily Stop prednisone Stay on symbicort Return 1 week for recheck with Nurse practionner Tammy Parrett

## 2012-09-08 ENCOUNTER — Ambulatory Visit (HOSPITAL_COMMUNITY)
Admission: RE | Admit: 2012-09-08 | Discharge: 2012-09-08 | Disposition: A | Discharge status: Home / Self Care | Payer: Medicare Other | Source: Ambulatory Visit | Attending: Critical Care Medicine | Admitting: Critical Care Medicine

## 2012-09-08 DIAGNOSIS — J45902 Unspecified asthma with status asthmaticus: Secondary | ICD-10-CM

## 2012-09-08 DIAGNOSIS — R0989 Other specified symptoms and signs involving the circulatory and respiratory systems: Secondary | ICD-10-CM | POA: Insufficient documentation

## 2012-09-08 DIAGNOSIS — J811 Chronic pulmonary edema: Secondary | ICD-10-CM | POA: Insufficient documentation

## 2012-09-08 DIAGNOSIS — R0609 Other forms of dyspnea: Secondary | ICD-10-CM | POA: Insufficient documentation

## 2012-09-08 DIAGNOSIS — R0600 Dyspnea, unspecified: Secondary | ICD-10-CM

## 2012-09-08 LAB — BLOOD GAS, ARTERIAL
Bicarbonate: 29.1 mEq/L — ABNORMAL HIGH (ref 20.0–24.0)
TCO2: 26.1 mmol/L (ref 0–100)
pCO2 arterial: 44.7 mmHg (ref 35.0–45.0)
pH, Arterial: 7.428 (ref 7.350–7.450)

## 2012-09-08 NOTE — Progress Notes (Signed)
Quick Note:  Call pt and tell her ABGs are ok, No change in medications No evidence of elevated carbon dioxide ______

## 2012-09-11 NOTE — Progress Notes (Signed)
Quick Note:  Called, spoke with pt. Informed her of ABG results and recs per Dr. Delford Field. She verbalized understanding of this, is aware of pending OV with TP on Wed, Sep 13, 2012, and voiced no further questions or concerns at this time. ______

## 2012-09-12 ENCOUNTER — Ambulatory Visit (HOSPITAL_COMMUNITY): Payer: Medicare Other | Attending: Critical Care Medicine | Admitting: Radiology

## 2012-09-12 DIAGNOSIS — R0600 Dyspnea, unspecified: Secondary | ICD-10-CM

## 2012-09-12 DIAGNOSIS — Z853 Personal history of malignant neoplasm of breast: Secondary | ICD-10-CM | POA: Insufficient documentation

## 2012-09-12 DIAGNOSIS — I10 Essential (primary) hypertension: Secondary | ICD-10-CM | POA: Insufficient documentation

## 2012-09-12 DIAGNOSIS — R0989 Other specified symptoms and signs involving the circulatory and respiratory systems: Secondary | ICD-10-CM | POA: Insufficient documentation

## 2012-09-12 DIAGNOSIS — R0609 Other forms of dyspnea: Secondary | ICD-10-CM | POA: Insufficient documentation

## 2012-09-12 DIAGNOSIS — G609 Hereditary and idiopathic neuropathy, unspecified: Secondary | ICD-10-CM | POA: Insufficient documentation

## 2012-09-12 NOTE — Progress Notes (Signed)
Echocardiogram performed.  

## 2012-09-13 ENCOUNTER — Encounter: Payer: Self-pay | Admitting: Adult Health

## 2012-09-13 ENCOUNTER — Ambulatory Visit (INDEPENDENT_AMBULATORY_CARE_PROVIDER_SITE_OTHER): Payer: Medicare Other | Admitting: Adult Health

## 2012-09-13 VITALS — BP 108/78 | HR 80 | Temp 98.3°F | Ht <= 58 in | Wt 163.2 lb

## 2012-09-13 DIAGNOSIS — J45909 Unspecified asthma, uncomplicated: Secondary | ICD-10-CM

## 2012-09-13 DIAGNOSIS — IMO0002 Reserved for concepts with insufficient information to code with codable children: Secondary | ICD-10-CM

## 2012-09-13 NOTE — Progress Notes (Signed)
Subjective:    Patient ID: Tracey Nguyen, female    DOB: 10/20/1940, 72 y.o.   MRN: [id number]  HPI 72 y.o.F with moderate persistent asthma, allergic rhinitis, OSA, restriction d/t scoliosis  05/22/2012 Pt notes more dyspnea since 6/13.  Seems worse three weeks ago. Pt notes more pndrip.  Notes more sneezing.  Pt notes more cough.  Notes more dyspnea with exertion.  88% on arrival , in past she passed without desaturation..  Pt is on 2Lcpap 14cmh20 at home. Mucus is yellow.  07/26/2012 At last ov we Rx pred pulse and symbicort Since then pt ill again and rx on 12/6: another pred round  Pt had chest tightness, more dyspnea. Pt noted more cough, no real mucus production. After on pred again, more mucus and was light green.  Notes some sinus drainage. Pt notes more wheeze.  Pt still with dyspnea but no longer tight in the chest area. No f/c/s.  No heartburn  09/06/2012 PT rx pred pulse over the phone but not improved. If walks across the floor will pant.  Pt notes tremors.  Notes some edema. No mucus now, cough is tight,  Chest is very tight.  If blow nose is white to green.  Notes some sinus pressure and over L eye hurts. Pt notes pndrip.  >>2 D echo , ABG , d/c prednisone and increased lasix 40mg    09/13/2012 Follow up  Patient returns for a one-week follow-up for persistent dyspnea, and asthma. Patient was suspected to have a component of fluid overload and was recommended to increase her Lasix to 40 mg daily. She underwent a 2-D echo that showed normal left ventricular function with an ejection fracture at 50-55%.Grade 1 diastolic dysfunction. ABG showed no evidence of hypercarbia Feels she is slightly better w/ decreased cough.  No significant change in dyspnea level.  Still has post nasal drainage and collects in throat.    Review of Systems  Constitutional:   No  weight loss, night sweats,  Fevers, chills,  +fatigue, or  lassitude.  HEENT:   No headaches,   Difficulty swallowing,  Tooth/dental problems, or  Sore throat,                No sneezing, itching, ear ache, + nasal congestion, post nasal drip,   CV:  No chest pain,  Orthopnea, PND, swelling in lower extremities, anasarca, dizziness, palpitations, syncope.   GI  No heartburn, indigestion, abdominal pain, nausea, vomiting, diarrhea, change in bowel habits, loss of appetite, bloody stools.   Resp:   No coughing up of blood.     No chest wall deformity  Skin: no rash or lesions.  GU: no dysuria, change in color of urine, no urgency or frequency.  No flank pain, no hematuria   MS:  No joint pain or swelling.  No decreased range of motion.  No back pain.  Psych:  No change in mood or affect. No depression or anxiety.  No memory loss.          Objective:   Physical Exam Gen: Pleasant, obese, in no distress,  normal affect  ENT: No lesions,  mouth clear,  oropharynx clear, purulent nares Neck: No JVD, no TMG, no carotid bruits  Lungs: No use of accessory muscles, no dullness to percussion, distant BS, bibasilar rales  Cardiovascular: RRR, heart sounds normal, no murmur or gallops, + peripheral edema  Abdomen: soft and NT, no HSM,  BS normal  Musculoskeletal: kyphoscoliosis,  no cyanosis or  clubbing  Neuro: alert, non focal  Skin: Warm, no lesions or rashes         Assessment & Plan:

## 2012-09-13 NOTE — Patient Instructions (Addendum)
Continue on current regimen. May take  Allegra 180 mg daily as needed. For postnasal drainage, and allergy symptoms Follow up with Dr. Delford Field in 2 weeks and as needed.

## 2012-09-14 ENCOUNTER — Telehealth: Payer: Self-pay | Admitting: Critical Care Medicine

## 2012-09-14 NOTE — Telephone Encounter (Signed)
I spoke to the patient 

## 2012-09-14 NOTE — Telephone Encounter (Signed)
I spoke with pt. She stated she is returning a call from Dr. Delford Field. She stated he tried calling her this AM. Please advise Dr. Delford Field thanks

## 2012-09-15 NOTE — Assessment & Plan Note (Addendum)
Recent flare complicated by fluid overload Echo showed nml LVF , grade 1 diastolic dysfunction  Some improvement w / diuresis   Plan  Continue on current regimen. May take  Allegra 180 mg daily as needed. For postnasal drainage, and allergy symptoms Follow up with Dr. Delford Field in 2 weeks and as needed.

## 2012-09-15 NOTE — Assessment & Plan Note (Addendum)
Improved control on rx  Low salt diet  follow up w/ PCP for b/p  Check bmet on return if not seen by PCP in 2 weeks

## 2012-09-28 ENCOUNTER — Ambulatory Visit: Payer: Medicare Other | Admitting: Adult Health

## 2012-10-02 ENCOUNTER — Encounter: Payer: Self-pay | Admitting: Adult Health

## 2012-10-02 ENCOUNTER — Ambulatory Visit (INDEPENDENT_AMBULATORY_CARE_PROVIDER_SITE_OTHER): Payer: Medicare Other | Admitting: Adult Health

## 2012-10-02 VITALS — BP 126/84 | HR 81 | Temp 98.7°F | Ht <= 58 in | Wt 162.2 lb

## 2012-10-02 MED ORDER — PREDNISONE 10 MG PO TABS: ORAL_TABLET | ORAL | Status: DC

## 2012-10-02 MED ORDER — LEVALBUTEROL HCL 0.63 MG/3ML IN NEBU
0.6300 mg | INHALATION_SOLUTION | Freq: Once | RESPIRATORY_TRACT | Status: AC
  Administered 2012-10-02: 0.63 mg via RESPIRATORY_TRACT

## 2012-10-02 NOTE — Addendum Note (Signed)
Addended by: Boone Master E on: 10/02/2012 12:37 PM   Modules accepted: Orders

## 2012-10-02 NOTE — Progress Notes (Signed)
Subjective:    Patient ID: Tracey Nguyen, female    DOB: 08-24-1940, 72 y.o.   MRN: [id number]  HPI 72 y.o.F with moderate persistent asthma, allergic rhinitis, OSA, restriction d/t scoliosis  05/22/2012 Pt notes more dyspnea since 6/13.  Seems worse three weeks ago. Pt notes more pndrip.  Notes more sneezing.  Pt notes more cough.  Notes more dyspnea with exertion.  88% on arrival , in past she passed without desaturation..  Pt is on 2Lcpap 14cmh20 at home. Mucus is yellow.  07/26/2012 At last ov we Rx pred pulse and symbicort Since then pt ill again and rx on 12/6: another pred round  Pt had chest tightness, more dyspnea. Pt noted more cough, no real mucus production. After on pred again, more mucus and was light green.  Notes some sinus drainage. Pt notes more wheeze.  Pt still with dyspnea but no longer tight in the chest area. No f/c/s.  No heartburn  09/06/2012 PT rx pred pulse over the phone but not improved. If walks across the floor will pant.  Pt notes tremors.  Notes some edema. No mucus now, cough is tight,  Chest is very tight.  If blow nose is white to green.  Notes some sinus pressure and over L eye hurts. Pt notes pndrip.  >>2 D echo , ABG , d/c prednisone and increased lasix 40mg    09/13/2012 Follow up  Patient returns for a one-week follow-up for persistent dyspnea, and asthma. Patient was suspected to have a component of fluid overload and was recommended to increase her Lasix to 40 mg daily. She underwent a 2-D echo that showed normal left ventricular function with an ejection fracture at 50-55%.Grade 1 diastolic dysfunction. ABG showed no evidence of hypercarbia Feels she is slightly better w/ decreased cough.  No significant change in dyspnea level.  Still has post nasal drainage and collects in throat.  >allegra   10/02/2012 Follow up  Pt returns for a 2 week follow up for asthma  2 week follow up asthma > reports symptoms improved, but worsened  again with increased SOB, occ wheezing, tightness in chest, dry cough x3-4days.  denies f/c/s Cough is mainly dry. No fever. No discolored .  No increased edema.  More wheezing over last few days.  No missed doses of symbicort .  No increased SABA use.     Review of Systems  Constitutional:   No  weight loss, night sweats,  Fevers, chills,  +fatigue, or  lassitude.  HEENT:   No headaches,  Difficulty swallowing,  Tooth/dental problems, or  Sore throat,                No sneezing, itching, ear ache, + nasal congestion, post nasal drip,   CV:  No chest pain,  Orthopnea, PND, swelling in lower extremities, anasarca, dizziness, palpitations, syncope.   GI  No heartburn, indigestion, abdominal pain, nausea, vomiting, diarrhea, change in bowel habits, loss of appetite, bloody stools.   Resp:   No coughing up of blood.     No chest wall deformity  Skin: no rash or lesions.  GU: no dysuria, change in color of urine, no urgency or frequency.  No flank pain, no hematuria   MS:  No joint pain or swelling.  No decreased range of motion.  No back pain.  Psych:  No change in mood or affect. No depression or anxiety.  No memory loss.          Objective:  Physical Exam Gen: Pleasant, obese, in no distress,  normal affect  ENT: No lesions,  mouth clear,  oropharynx clear, purulent nares Neck: No JVD, no TMG, no carotid bruits  Lungs: No use of accessory muscles, no dullness to percussion, distant BS, no wheeze   Cardiovascular: RRR, heart sounds normal, no murmur or gallops, no  peripheral edema  Abdomen: soft and NT, no HSM,  BS normal  Musculoskeletal: kyphoscoliosis,  no cyanosis or clubbing  Neuro: alert, non focal  Skin: Warm, no lesions or rashes         Assessment & Plan:

## 2012-10-02 NOTE — Assessment & Plan Note (Addendum)
Exacerbation w/ AR  xopenex neb x 1 in office   Plan  Prednisone taper over next week.  Saline nasal rinses As needed   Please contact office for sooner follow up if symptoms do not improve or worsen or seek emergency care  Follow up Dr. Delford Field  In 2-3 months

## 2012-10-02 NOTE — Patient Instructions (Addendum)
Prednisone taper over next week.  Saline nasal rinses As needed   Please contact office for sooner follow up if symptoms do not improve or worsen or seek emergency care  Follow up Dr. Delford Field  In 2-3 months

## 2012-10-13 ENCOUNTER — Encounter (HOSPITAL_COMMUNITY): Payer: Self-pay | Admitting: Pharmacy Technician

## 2012-10-13 NOTE — Progress Notes (Signed)
Preop appointment on 10/23/12 at 1130am.  Surgery scheduled for 10/30/12.  Need orders in EPIC.  Thanks.

## 2012-10-20 ENCOUNTER — Ambulatory Visit (INDEPENDENT_AMBULATORY_CARE_PROVIDER_SITE_OTHER): Payer: Medicare Other | Admitting: Family

## 2012-10-20 ENCOUNTER — Ambulatory Visit: Payer: Medicare Other

## 2012-10-20 ENCOUNTER — Encounter: Payer: Self-pay | Admitting: Family

## 2012-10-20 VITALS — BP 112/80 | HR 88 | Temp 99.2°F | Wt 162.0 lb

## 2012-10-20 DIAGNOSIS — Z0000 Encounter for general adult medical examination without abnormal findings: Secondary | ICD-10-CM

## 2012-10-20 LAB — LIPID PANEL
Cholesterol: 173 mg/dL (ref 0–200)
HDL: 30.4 mg/dL — ABNORMAL LOW (ref >39.00)
Triglycerides: 137 mg/dL (ref 0.0–149.0)
VLDL: 27.4 mg/dL (ref 0.0–40.0)

## 2012-10-20 LAB — CBC WITH DIFFERENTIAL/PLATELET
Basophils Absolute: 0 10*3/uL (ref 0.0–0.1)
Eosinophils Relative: 3.2 % (ref 0.0–5.0)
HCT: 36.8 % (ref 36.0–46.0)
Lymphocytes Relative: 10.8 % — ABNORMAL LOW (ref 12.0–46.0)
Monocytes Relative: 7.1 % (ref 3.0–12.0)
Neutrophils Relative %: 78.4 % — ABNORMAL HIGH (ref 43.0–77.0)
Platelets: 234 10*3/uL (ref 150.0–400.0)
RDW: 15.3 % — ABNORMAL HIGH (ref 11.5–14.6)
WBC: 10.7 10*3/uL — ABNORMAL HIGH (ref 4.5–10.5)

## 2012-10-20 LAB — BASIC METABOLIC PANEL
BUN: 28 mg/dL — ABNORMAL HIGH (ref 6–23)
CO2: 31 mEq/L (ref 19–32)
Calcium: 9.5 mg/dL (ref 8.4–10.5)
GFR: 36.48 mL/min — ABNORMAL LOW (ref >60.00)
Glucose, Bld: 110 mg/dL — ABNORMAL HIGH (ref 70–99)
Sodium: 142 mEq/L (ref 135–145)

## 2012-10-20 LAB — TSH: TSH: 3.06 u[IU]/mL (ref 0.35–5.50)

## 2012-10-20 LAB — HEMOGLOBIN A1C: Hgb A1c MFr Bld: 6.3 % (ref 4.6–6.5)

## 2012-10-20 LAB — HEPATIC FUNCTION PANEL
Albumin: 3.6 g/dL (ref 3.5–5.2)
Alkaline Phosphatase: 71 U/L (ref 39–117)
Total Protein: 6.6 g/dL (ref 6.0–8.3)

## 2012-10-20 MED ORDER — DULOXETINE HCL 30 MG PO CPEP: 30.0000 mg | ORAL_CAPSULE | Freq: Every day | ORAL | Status: DC

## 2012-10-20 NOTE — Patient Instructions (Addendum)
20 Tracey Nguyen  10/20/2012   Your procedure is scheduled on: 10/30/12  Report to [facility] at 1000 AM.  Call this number if you have problems the morning of surgery [phone]: [phone]   Remember: please bring CPAP mask and tubing, please bring inhalers   Do not eat food or drink liquids After Midnight.     Take these medicines the morning of surgery with A SIP OF WATER: cymbalta, inhalers if needed, nebivolol, prilosec, tramadol if needed   Do not wear jewelry, make-up or nail polish.  Do not wear lotions, powders, or perfumes. You may wear deodorant.  Do not shave 48 hours prior to surgery. Men may shave face and neck.  Do not bring valuables to the hospital.  Contacts, dentures or bridgework may not be worn into surgery.  Leave suitcase in the car. After surgery it may be brought to your room.  For patients admitted to the hospital, checkout time is 11:00 AM the day of discharge.    Please read over the following fact sheets that you were given: MRSA Information, blood fact sheet, incentive spirometer fact sheet  Birdie Sons, RN  pre op nurse call if needed [phone]    FAILURE TO FOLLOW THESE INSTRUCTIONS MAY RESULT IN CANCELLATION OF YOUR SURGERY   Patient Signature: ___________________________________________

## 2012-10-20 NOTE — Progress Notes (Signed)
Subjective:    Patient ID: Tracey Nguyen, female    DOB: 28-Jun-1941, 72 y.o.   MRN: [id number]  HPI 72 year old white female, nonsmoker, patient of Dr. Tawanna Cooler is in today for complete physical exam a surgical clearance. She has a history of hypertension, GERD, restless leg syndrome. She's currently tolerating her medications well. Patient is scheduled for a vision femur nailing through [facility]. She erythema repair several years ago and reports having a bend in the femur this causes significant pain.   Review of Systems  Constitutional: Negative.   HENT: Negative.   Eyes: Negative.   Respiratory: Negative.   Cardiovascular: Negative.   Gastrointestinal: Negative.   Endocrine: Negative.   Genitourinary: Negative.   Musculoskeletal: Negative.   Skin: Negative.   Allergic/Immunologic: Negative.   Neurological: Negative.   Hematological: Negative.   Psychiatric/Behavioral: Negative.    Past Medical History  Diagnosis Date  . Asthma   . Arthritis   . Cancer 2007    breast cancer  . GERD (gastroesophageal reflux disease)   . Hypertension   . Osteoporosis   . Sleep apnea     on cpap  . Blood transfusion   . Cataract     BILATERAL -REMOVED VIA SURGERY  . History of D&C     History   Social History  . Marital Status: Married    Spouse Name: N/A    Number of Children: N/A  . Years of Education: N/A   Occupational History  . Not on file.   Social History Main Topics  . Smoking status: Never Smoker   . Smokeless tobacco: Never Used  . Alcohol Use: No  . Drug Use: No  . Sexually Active: Not on file   Other Topics Concern  . Not on file   Social History Narrative  . No narrative on file    Past Surgical History  Procedure Laterality Date  . Femur fracture surgery  2011    titanium rod/right  . Mastectomy  2007    right  . Hemorrhoidectomy  1987  . Breast biopsy  1995    left  . Cataract extraction, bilateral  2006    History  reviewed. No pertinent family history.  Allergies  Allergen Reactions  . Codeine     REACTION: spacey    Current Outpatient Prescriptions on File Prior to Visit  Medication Sig Dispense Refill  . albuterol-ipratropium (COMBIVENT) 18-103 MCG/ACT inhaler Inhale 2 puffs into the lungs every 6 (six) hours as needed for wheezing or shortness of breath.      . budesonide-formoterol (SYMBICORT) 160-4.5 MCG/ACT inhaler Inhale 2 puffs into the lungs 2 (two) times daily.      . Calcium Carbonate (CALTRATE 600 PO) Take 1 tablet by mouth daily.       . furosemide (LASIX) 40 MG tablet Take 40 mg by mouth daily before breakfast.      . losartan (COZAAR) 100 MG tablet Take 100 mg by mouth daily before breakfast.      . Multiple Vitamin (MULTIVITAMIN) tablet Take 1 tablet by mouth every other day.       . nebivolol (BYSTOLIC) 5 MG tablet Take 5 mg by mouth daily after breakfast.      . omeprazole (PRILOSEC) 20 MG capsule Take 20 mg by mouth daily before breakfast.      . pramipexole (MIRAPEX) 1 MG tablet Take 1 mg by mouth every evening.       . traMADol (ULTRAM) 50 MG tablet Take  50 mg by mouth every 6 (six) hours as needed for pain.       No current facility-administered medications on file prior to visit.    BP 112/80  Pulse 88  Temp(Src) 99.2 F (37.3 C) (Oral)  Wt 162 lb (73.483 kg)  BMI 33.87 kg/m2  SpO2 96%chart    Objective:   Physical Exam  Constitutional: She is oriented to person, place, and time. She appears well-developed and well-nourished.  HENT:  Right Ear: External ear normal.  Left Ear: External ear normal.  Nose: Nose normal.  Mouth/Throat: Oropharynx is clear and moist.  Eyes: Conjunctivae are normal. Pupils are equal, round, and reactive to light.  Neck: Normal range of motion. Neck supple.  Cardiovascular: Normal rate, regular rhythm and normal heart sounds.   Pulmonary/Chest: Effort normal and breath sounds normal.  Abdominal: Soft. Bowel sounds are normal.   Musculoskeletal: Normal range of motion.  Neurological: She is alert and oriented to person, place, and time. She has normal reflexes.  Skin: Skin is warm and dry.  Psychiatric: She has a normal mood and affect.      Tetanus administered    Assessment & Plan:  Assessment:  1. Complete physical exam 2. Hypertension 3. GERD   4. Restless leg syndrome 5. Surgical clearance  Plan: Continue current medications. Encouraged healthy diet and exercise. Lab sent. Will have patient followup with Dr. Tawanna Cooler in 3-4 months and sooner as needed.

## 2012-10-20 NOTE — Progress Notes (Signed)
EKG 10/20/12 on EPIC, Chest x-ray 09/06/12 on EPIC

## 2012-10-23 ENCOUNTER — Encounter (HOSPITAL_COMMUNITY)
Admission: RE | Admit: 2012-10-23 | Discharge: 2012-10-23 | Disposition: A | Discharge status: Home / Self Care | Payer: Medicare Other | Source: Ambulatory Visit | Attending: Orthopedic Surgery | Admitting: Orthopedic Surgery

## 2012-10-23 ENCOUNTER — Encounter (HOSPITAL_COMMUNITY): Payer: Self-pay

## 2012-10-23 HISTORY — DX: Pneumonia, unspecified organism: J18.9

## 2012-10-23 HISTORY — DX: Headache: R51

## 2012-10-23 HISTORY — DX: Adverse effect of unspecified anesthetic, initial encounter: T41.45XA

## 2012-10-23 HISTORY — DX: Other complications of anesthesia, initial encounter: T88.59XA

## 2012-10-23 LAB — BASIC METABOLIC PANEL
BUN: 28 mg/dL — ABNORMAL HIGH (ref 6–23)
CO2: 31 mEq/L (ref 19–32)
Calcium: 10.2 mg/dL (ref 8.4–10.5)
Chloride: 99 mEq/L (ref 96–112)
Creatinine, Ser: 1.43 mg/dL — ABNORMAL HIGH (ref 0.50–1.10)

## 2012-10-23 LAB — CBC
HCT: 37.8 % (ref 36.0–46.0)
MCHC: 31 g/dL (ref 30.0–36.0)
MCV: 65.9 fL — ABNORMAL LOW (ref 78.0–100.0)
Platelets: 226 10*3/uL (ref 150–400)
RDW: 14.7 % (ref 11.5–15.5)

## 2012-10-23 LAB — SURGICAL PCR SCREEN
MRSA, PCR: NEGATIVE
Staphylococcus aureus: NEGATIVE

## 2012-10-23 LAB — ABO/RH: ABO/RH(D): O POS

## 2012-10-23 LAB — URINALYSIS, ROUTINE W REFLEX MICROSCOPIC
Bilirubin Urine: NEGATIVE
Glucose, UA: NEGATIVE mg/dL
Hgb urine dipstick: NEGATIVE
Ketones, ur: NEGATIVE mg/dL
Leukocytes, UA: NEGATIVE
Protein, ur: NEGATIVE mg/dL
pH: 6.5 (ref 5.0–8.0)

## 2012-10-23 LAB — APTT: aPTT: 33 s (ref 24–37)

## 2012-10-24 ENCOUNTER — Telehealth: Payer: Self-pay | Admitting: Critical Care Medicine

## 2012-10-24 MED ORDER — FLUTICASONE PROPIONATE 50 MCG/ACT NA SUSP: 1.0000 | Freq: Every day | NASAL | Status: DC

## 2012-10-24 MED ORDER — AMOXICILLIN-POT CLAVULANATE 875-125 MG PO TABS: 1.0000 | ORAL_TABLET | Freq: Two times a day (BID) | ORAL | Status: AC

## 2012-10-24 NOTE — Telephone Encounter (Signed)
Spoke with pt and notified of recs per PW She verbalized understanding Rxs were sent to [facility]

## 2012-10-24 NOTE — Telephone Encounter (Signed)
lmomtcb x1 on home# Called cell # NA but unable to leave VM

## 2012-10-24 NOTE — Telephone Encounter (Signed)
Patient returning call again.

## 2012-10-24 NOTE — Telephone Encounter (Signed)
I spoke with pt. She c/o hoarseness, nasal congestion, blowing out green phlem w/ occasional bloody streaks, wheezing, slight increase SOB, cough w/ occasional phlem (not sure what color), PND x 3 days. No f/c/s/n/v. She is using her symbicort inhaler and combivent. Offered OV but stated she is currently in a wheelchair and is hard for her to get anywhere since she broke her femur bone. She is scheduled to have surgery on this Monday. She is requesting to have RX called in. Please advise Dr. Delford Field thanks  Allergies  Allergen Reactions  . Codeine     REACTION: spacey

## 2012-10-24 NOTE — Telephone Encounter (Signed)
Call in :  flonase two puff daily ea nostril  augmentin 875mg  bid x 10days

## 2012-10-26 ENCOUNTER — Telehealth: Payer: Self-pay | Admitting: Critical Care Medicine

## 2012-10-26 NOTE — Telephone Encounter (Signed)
Take abx on full stomach Ok to take meds even if having surgery Monday

## 2012-10-26 NOTE — Telephone Encounter (Signed)
I spoke with pt and is aware of PW recs. She voiced her understanding and needed nothing further

## 2012-10-26 NOTE — Telephone Encounter (Signed)
Patient states she was prescribed medications by Dr.Wright: amoxicillin and fluticasone Per patient Both medication bottles state "if you are having surgery please advise your doctor." Patient broke femur bone again (1st time 2 yrs ago and the lastest time in Feb 2014) and will have surgery Mon Mar 24 at [facility] for this. Patient would like to let Dr. Delford Field know this in case she needs to be off the medication.  Patient also states the Amoxicillin is making her very nauseas after taking a dose Tuesday (10/23/12) night and her doses last night (10/24/12) and has had none today.  Dr. Delford Field please advise, thank you  Last OV:10/02/12 Next OV:12/05/12  Allergies  Allergen Reactions  . Codeine     REACTION: spacey

## 2012-10-29 MED ORDER — TRANEXAMIC ACID 100 MG/ML IV SOLN
1000.0000 mg | Freq: Once | INTRAVENOUS | Status: AC
  Administered 2012-10-30: 1000 mg via INTRAVENOUS
  Filled 2012-10-29: qty 10

## 2012-10-29 NOTE — H&P (Signed)
Tracey Nguyen is an 72 y.o. female.    Procedure:  Revision of right femur IM nailing  Chief Complaint: Right hip pain / IM nail failure  HPI: Pt is a 72 y.o. female complaining of right hip pain for a couple of months. Pain had continually increased since that time and now has a lot of difficulty bearing weight on the right leg.  X-rays in the clinic show previous IM nailing of the right hip with failure of the nail . Pt has tried various conservative treatments which have failed to alleviate their symptoms, including NSAIDs, analgesic medications and modifications. Various options are discussed with the patient. Risks, benefits and expectations were discussed with the patient. Patient understand the risks, benefits and expectations and wishes to proceed with surgery.   PCP:  Evette Georges, MD  D/C Plans:  Home with HHPT vs SNF  Post-op Meds:  No Rx given  Tranexamic Acid:   To be given  Decadron:   To be given  PMH: Past Medical History  Diagnosis Date  . Asthma   . Arthritis   . Cancer 2007    breast cancer  . GERD (gastroesophageal reflux disease)   . Hypertension   . Osteoporosis   . Sleep apnea     on cpap  . Blood transfusion   . Cataract     BILATERAL -REMOVED VIA SURGERY  . History of D&C   . Pneumonia     hx of  . Headache     hx of migraines  . Complication of anesthesia     hard to wake up in 1970    PSH: Past Surgical History  Procedure Laterality Date  . Femur fracture surgery  2011    titanium rod/right  . Mastectomy  2007    right  . Hemorrhoidectomy  1987  . Breast biopsy  1995    left  . Cataract extraction, bilateral  2006  . Dilation and curettage of uterus      Social History:  reports that she has never smoked. She has never used smokeless tobacco. She reports that she does not drink alcohol or use illicit drugs.  Allergies:  Allergies  Allergen Reactions  . Codeine     REACTION: spacey    Medications: No  current facility-administered medications for this encounter.   Current Outpatient Prescriptions  Medication Sig Dispense Refill  . albuterol-ipratropium (COMBIVENT) 18-103 MCG/ACT inhaler Inhale 2 puffs into the lungs every 6 (six) hours as needed for wheezing or shortness of breath.      Marland Kitchen amoxicillin-clavulanate (AUGMENTIN) 875-125 MG per tablet Take 1 tablet by mouth 2 (two) times daily.  20 tablet  0  . budesonide-formoterol (SYMBICORT) 160-4.5 MCG/ACT inhaler Inhale 2 puffs into the lungs 2 (two) times daily.      . Calcium Carbonate (CALTRATE 600 PO) Take 1 tablet by mouth daily.       . DULoxetine (CYMBALTA) 30 MG capsule Take 1 capsule (30 mg total) by mouth daily.  30 capsule  5  . fluticasone (FLONASE) 50 MCG/ACT nasal spray Place 1 spray into the nose daily.  16 g  3  . furosemide (LASIX) 40 MG tablet Take 40 mg by mouth daily before breakfast.      . losartan (COZAAR) 100 MG tablet Take 100 mg by mouth daily before breakfast.      . Multiple Vitamin (MULTIVITAMIN) tablet Take 1 tablet by mouth every other day.       Marland Kitchen  nebivolol (BYSTOLIC) 5 MG tablet Take 5 mg by mouth daily after breakfast.      . omeprazole (PRILOSEC) 20 MG capsule Take 20 mg by mouth daily before breakfast.      . pramipexole (MIRAPEX) 1 MG tablet Take 1 mg by mouth every evening.       . traMADol (ULTRAM) 50 MG tablet Take 50 mg by mouth every 6 (six) hours as needed for pain.        Review of Systems  Constitutional: Negative.   HENT: Negative.   Eyes: Negative.   Respiratory: Positive for cough and shortness of breath.   Cardiovascular: Positive for leg swelling.  Gastrointestinal: Positive for diarrhea.  Genitourinary: Positive for urgency and frequency.  Musculoskeletal: Positive for myalgias, back pain and joint pain.  Skin: Negative.   Neurological: Positive for tremors.  Endo/Heme/Allergies: Positive for environmental allergies.     Physical Exam  Constitutional: She is oriented to person,  place, and time and well-developed, well-nourished, and in no distress.  HENT:  Head: Normocephalic and atraumatic.  Eyes: Pupils are equal, round, and reactive to light.  Neck: Neck supple. No JVD present. No tracheal deviation present.  Cardiovascular: Normal rate, regular rhythm and intact distal pulses.   Pulmonary/Chest: Effort normal. No respiratory distress. She has wheezes.  Abdominal: Soft. There is no tenderness. There is no guarding.  Musculoskeletal:       Right hip: She exhibits decreased range of motion, decreased strength, tenderness, bony tenderness and laceration (previous hip surgery).  Neurological: She is alert and oriented to person, place, and time.  Skin: Skin is warm and dry.  Psychiatric: Affect normal.      Assessment/Plan Assessment:   Right hip pain / IM nail failure   Plan: Patient will undergo a revision of the right femur IM nailing on 10/30/2012 per Dr. Charlann Boxer at [facility]. Risks benefits and expectations were discussed with the patient. Patient understand risks, benefits and expectations and wishes to proceed.   Anastasio Auerbach Liddy Deam   PAC  10/29/2012, 9:16 AM

## 2012-10-30 ENCOUNTER — Inpatient Hospital Stay (HOSPITAL_COMMUNITY): Payer: Medicare Other

## 2012-10-30 ENCOUNTER — Inpatient Hospital Stay (HOSPITAL_COMMUNITY): Payer: Medicare Other | Admitting: Anesthesiology

## 2012-10-30 ENCOUNTER — Encounter (HOSPITAL_COMMUNITY): Payer: Self-pay | Admitting: *Deleted

## 2012-10-30 ENCOUNTER — Inpatient Hospital Stay (HOSPITAL_COMMUNITY)
Admission: RE | Admit: 2012-10-30 | Discharge: 2012-11-02 | DRG: 481 | Disposition: A | Discharge status: Rehabilitation Facility | Payer: Medicare Other | Source: Ambulatory Visit | Attending: Orthopedic Surgery | Admitting: Orthopedic Surgery

## 2012-10-30 ENCOUNTER — Encounter (HOSPITAL_COMMUNITY)
Admission: RE | Disposition: A | Discharge status: Rehabilitation Facility | Payer: Self-pay | Source: Ambulatory Visit | Attending: Orthopedic Surgery

## 2012-10-30 ENCOUNTER — Encounter (HOSPITAL_COMMUNITY): Payer: Self-pay | Admitting: Anesthesiology

## 2012-10-30 DIAGNOSIS — M810 Age-related osteoporosis without current pathological fracture: Secondary | ICD-10-CM | POA: Diagnosis present

## 2012-10-30 DIAGNOSIS — D62 Acute posthemorrhagic anemia: Secondary | ICD-10-CM | POA: Diagnosis not present

## 2012-10-30 DIAGNOSIS — T849XXA Unspecified complication of internal orthopedic prosthetic device, implant and graft, initial encounter: Secondary | ICD-10-CM

## 2012-10-30 DIAGNOSIS — E669 Obesity, unspecified: Secondary | ICD-10-CM | POA: Diagnosis present

## 2012-10-30 DIAGNOSIS — IMO0002 Reserved for concepts with insufficient information to code with codable children: Principal | ICD-10-CM | POA: Diagnosis present

## 2012-10-30 DIAGNOSIS — Z853 Personal history of malignant neoplasm of breast: Secondary | ICD-10-CM

## 2012-10-30 DIAGNOSIS — T849XXD Unspecified complication of internal orthopedic prosthetic device, implant and graft, subsequent encounter: Secondary | ICD-10-CM

## 2012-10-30 DIAGNOSIS — I10 Essential (primary) hypertension: Secondary | ICD-10-CM | POA: Diagnosis present

## 2012-10-30 DIAGNOSIS — K219 Gastro-esophageal reflux disease without esophagitis: Secondary | ICD-10-CM | POA: Diagnosis present

## 2012-10-30 DIAGNOSIS — Z01812 Encounter for preprocedural laboratory examination: Secondary | ICD-10-CM

## 2012-10-30 HISTORY — PX: FEMUR IM NAIL: SHX1597

## 2012-10-30 HISTORY — DX: Unspecified complication of internal orthopedic prosthetic device, implant and graft, initial encounter: T84.9XXA

## 2012-10-30 HISTORY — PX: HIP SURGERY: SHX245

## 2012-10-30 SURGERY — INSERTION, INTRAMEDULLARY ROD, FEMUR
Anesthesia: General | Site: Hip | Laterality: Right | Wound class: Clean

## 2012-10-30 MED ORDER — DULOXETINE HCL 30 MG PO CPEP
30.0000 mg | ORAL_CAPSULE | Freq: Every day | ORAL | Status: DC
  Administered 2012-10-31 – 2012-11-02 (×3): 30 mg via ORAL
  Filled 2012-10-30 (×3): qty 1

## 2012-10-30 MED ORDER — HYDROMORPHONE HCL PF 1 MG/ML IJ SOLN
0.2500 mg | INTRAMUSCULAR | Status: DC | PRN
  Administered 2012-10-30 (×2): 0.5 mg via INTRAVENOUS

## 2012-10-30 MED ORDER — CEFAZOLIN SODIUM-DEXTROSE 2-3 GM-% IV SOLR
2.0000 g | INTRAVENOUS | Status: AC
  Administered 2012-10-30: 2 g via INTRAVENOUS

## 2012-10-30 MED ORDER — AMOXICILLIN-POT CLAVULANATE 875-125 MG PO TABS
1.0000 | ORAL_TABLET | Freq: Two times a day (BID) | ORAL | Status: DC
  Administered 2012-10-30 – 2012-11-02 (×6): 1 via ORAL
  Filled 2012-10-30 (×7): qty 1

## 2012-10-30 MED ORDER — PHENYLEPHRINE HCL 10 MG/ML IJ SOLN
INTRAMUSCULAR | Status: DC | PRN
  Administered 2012-10-30: 80 ug via INTRAVENOUS
  Administered 2012-10-30: 120 ug via INTRAVENOUS
  Administered 2012-10-30: 80 ug via INTRAVENOUS

## 2012-10-30 MED ORDER — CEFAZOLIN SODIUM-DEXTROSE 2-3 GM-% IV SOLR
2.0000 g | Freq: Four times a day (QID) | INTRAVENOUS | Status: AC
  Administered 2012-10-31: 2 g via INTRAVENOUS
  Filled 2012-10-30 (×2): qty 50

## 2012-10-30 MED ORDER — METOCLOPRAMIDE HCL 5 MG/ML IJ SOLN: 5.0000 mg | Freq: Three times a day (TID) | INTRAMUSCULAR | Status: DC | PRN

## 2012-10-30 MED ORDER — 0.9 % SODIUM CHLORIDE (POUR BTL) OPTIME
TOPICAL | Status: DC | PRN
  Administered 2012-10-30: 1000 mL

## 2012-10-30 MED ORDER — METHOCARBAMOL 500 MG PO TABS
500.0000 mg | ORAL_TABLET | Freq: Four times a day (QID) | ORAL | Status: DC | PRN
  Administered 2012-10-30: 500 mg via ORAL
  Filled 2012-10-30: qty 1

## 2012-10-30 MED ORDER — MENTHOL 3 MG MT LOZG: 1.0000 | LOZENGE | OROMUCOSAL | Status: DC | PRN

## 2012-10-30 MED ORDER — PANTOPRAZOLE SODIUM 40 MG PO TBEC
40.0000 mg | DELAYED_RELEASE_TABLET | Freq: Every day | ORAL | Status: DC
  Administered 2012-10-31 – 2012-11-02 (×3): 40 mg via ORAL
  Filled 2012-10-30 (×3): qty 1

## 2012-10-30 MED ORDER — SUCCINYLCHOLINE CHLORIDE 20 MG/ML IJ SOLN
INTRAMUSCULAR | Status: DC | PRN
  Administered 2012-10-30: 100 mg via INTRAVENOUS

## 2012-10-30 MED ORDER — PROPOFOL 10 MG/ML IV BOLUS
INTRAVENOUS | Status: DC | PRN
  Administered 2012-10-30: 150 mg via INTRAVENOUS

## 2012-10-30 MED ORDER — ALUM & MAG HYDROXIDE-SIMETH 200-200-20 MG/5ML PO SUSP: 30.0000 mL | ORAL | Status: DC | PRN

## 2012-10-30 MED ORDER — EPHEDRINE SULFATE 50 MG/ML IJ SOLN
INTRAMUSCULAR | Status: DC | PRN
  Administered 2012-10-30 (×4): 10 mg via INTRAVENOUS
  Administered 2012-10-30 (×2): 5 mg via INTRAVENOUS
  Administered 2012-10-30 (×2): 10 mg via INTRAVENOUS

## 2012-10-30 MED ORDER — PRAMIPEXOLE DIHYDROCHLORIDE 1 MG PO TABS
1.0000 mg | ORAL_TABLET | Freq: Every evening | ORAL | Status: DC
  Administered 2012-10-30 – 2012-11-01 (×3): 1 mg via ORAL
  Filled 2012-10-30 (×4): qty 1

## 2012-10-30 MED ORDER — NEBIVOLOL HCL 5 MG PO TABS
5.0000 mg | ORAL_TABLET | Freq: Every day | ORAL | Status: DC
  Administered 2012-11-02: 5 mg via ORAL
  Filled 2012-10-30 (×4): qty 1

## 2012-10-30 MED ORDER — OXYCODONE HCL 5 MG PO TABS: 5.0000 mg | ORAL_TABLET | Freq: Once | ORAL | Status: DC | PRN

## 2012-10-30 MED ORDER — BISACODYL 10 MG RE SUPP: 10.0000 mg | Freq: Every day | RECTAL | Status: DC | PRN

## 2012-10-30 MED ORDER — HYDROCODONE-ACETAMINOPHEN 7.5-325 MG PO TABS
1.0000 | ORAL_TABLET | ORAL | Status: DC
  Administered 2012-10-30 – 2012-10-31 (×3): 1 via ORAL
  Filled 2012-10-30: qty 1
  Filled 2012-10-30: qty 2
  Filled 2012-10-30: qty 1

## 2012-10-30 MED ORDER — ONDANSETRON HCL 4 MG PO TABS: 4.0000 mg | ORAL_TABLET | Freq: Four times a day (QID) | ORAL | Status: DC | PRN

## 2012-10-30 MED ORDER — LACTATED RINGERS IV SOLN
INTRAVENOUS | Status: DC | PRN
  Administered 2012-10-30 (×3): via INTRAVENOUS

## 2012-10-30 MED ORDER — METOCLOPRAMIDE HCL 10 MG PO TABS: 5.0000 mg | ORAL_TABLET | Freq: Three times a day (TID) | ORAL | Status: DC | PRN

## 2012-10-30 MED ORDER — FLUTICASONE PROPIONATE 50 MCG/ACT NA SUSP
1.0000 | Freq: Every day | NASAL | Status: DC
  Administered 2012-10-31 – 2012-11-02 (×3): 1 via NASAL
  Filled 2012-10-30: qty 16

## 2012-10-30 MED ORDER — OXYCODONE HCL 5 MG/5ML PO SOLN
5.0000 mg | Freq: Once | ORAL | Status: DC | PRN
  Filled 2012-10-30: qty 5

## 2012-10-30 MED ORDER — FUROSEMIDE 40 MG PO TABS
40.0000 mg | ORAL_TABLET | Freq: Every day | ORAL | Status: DC
  Administered 2012-11-01 – 2012-11-02 (×2): 40 mg via ORAL
  Filled 2012-10-30 (×4): qty 1

## 2012-10-30 MED ORDER — FENTANYL CITRATE 0.05 MG/ML IJ SOLN
INTRAMUSCULAR | Status: DC | PRN
  Administered 2012-10-30: 25 ug via INTRAVENOUS
  Administered 2012-10-30: 50 ug via INTRAVENOUS
  Administered 2012-10-30: 100 ug via INTRAVENOUS

## 2012-10-30 MED ORDER — FLEET ENEMA 7-19 GM/118ML RE ENEM: 1.0000 | ENEMA | Freq: Once | RECTAL | Status: AC | PRN

## 2012-10-30 MED ORDER — RIVAROXABAN 10 MG PO TABS
10.0000 mg | ORAL_TABLET | ORAL | Status: DC
  Administered 2012-10-31 – 2012-11-02 (×3): 10 mg via ORAL
  Filled 2012-10-30 (×4): qty 1

## 2012-10-30 MED ORDER — FERROUS SULFATE 325 (65 FE) MG PO TABS
325.0000 mg | ORAL_TABLET | Freq: Three times a day (TID) | ORAL | Status: DC
  Filled 2012-10-30 (×11): qty 1

## 2012-10-30 MED ORDER — CELECOXIB 200 MG PO CAPS
200.0000 mg | ORAL_CAPSULE | Freq: Two times a day (BID) | ORAL | Status: DC
  Administered 2012-10-30 – 2012-11-02 (×6): 200 mg via ORAL
  Filled 2012-10-30 (×7): qty 1

## 2012-10-30 MED ORDER — ZOLPIDEM TARTRATE 5 MG PO TABS: 5.0000 mg | ORAL_TABLET | Freq: Every evening | ORAL | Status: DC | PRN

## 2012-10-30 MED ORDER — DEXTROSE 5 % IV SOLN: 500.0000 mg | Freq: Four times a day (QID) | INTRAVENOUS | Status: DC | PRN

## 2012-10-30 MED ORDER — DEXAMETHASONE SODIUM PHOSPHATE 10 MG/ML IJ SOLN: 10.0000 mg | Freq: Once | INTRAMUSCULAR | Status: DC

## 2012-10-30 MED ORDER — LOSARTAN POTASSIUM 50 MG PO TABS
100.0000 mg | ORAL_TABLET | Freq: Every day | ORAL | Status: DC
  Administered 2012-11-02: 100 mg via ORAL
  Filled 2012-10-30 (×4): qty 2

## 2012-10-30 MED ORDER — GLYCOPYRROLATE 0.2 MG/ML IJ SOLN
INTRAMUSCULAR | Status: DC | PRN
  Administered 2012-10-30: 0.3 mg via INTRAVENOUS

## 2012-10-30 MED ORDER — PHENOL 1.4 % MT LIQD: 1.0000 | OROMUCOSAL | Status: DC | PRN

## 2012-10-30 MED ORDER — SODIUM CHLORIDE 0.9 % IV SOLN
100.0000 mL/h | INTRAVENOUS | Status: DC
  Administered 2012-10-30: 100 mL/h via INTRAVENOUS
  Filled 2012-10-30 (×9): qty 1000

## 2012-10-30 MED ORDER — DIPHENHYDRAMINE HCL 25 MG PO CAPS: 25.0000 mg | ORAL_CAPSULE | Freq: Four times a day (QID) | ORAL | Status: DC | PRN

## 2012-10-30 MED ORDER — METOCLOPRAMIDE HCL 5 MG/ML IJ SOLN: 10.0000 mg | Freq: Once | INTRAMUSCULAR | Status: DC | PRN

## 2012-10-30 MED ORDER — POLYETHYLENE GLYCOL 3350 17 G PO PACK
17.0000 g | PACK | Freq: Two times a day (BID) | ORAL | Status: DC
  Administered 2012-10-30 – 2012-11-01 (×3): 17 g via ORAL

## 2012-10-30 MED ORDER — IPRATROPIUM-ALBUTEROL 18-103 MCG/ACT IN AERO
2.0000 | INHALATION_SPRAY | Freq: Four times a day (QID) | RESPIRATORY_TRACT | Status: DC | PRN
  Filled 2012-10-30: qty 14.7

## 2012-10-30 MED ORDER — ONDANSETRON HCL 4 MG/2ML IJ SOLN
4.0000 mg | Freq: Four times a day (QID) | INTRAMUSCULAR | Status: DC | PRN
  Administered 2012-10-31: 4 mg via INTRAVENOUS
  Filled 2012-10-30: qty 2

## 2012-10-30 MED ORDER — HYDROMORPHONE HCL PF 1 MG/ML IJ SOLN: 0.5000 mg | INTRAMUSCULAR | Status: DC | PRN

## 2012-10-30 MED ORDER — DOCUSATE SODIUM 100 MG PO CAPS
100.0000 mg | ORAL_CAPSULE | Freq: Two times a day (BID) | ORAL | Status: DC
  Administered 2012-10-30 – 2012-11-02 (×5): 100 mg via ORAL

## 2012-10-30 MED ORDER — DEXAMETHASONE SODIUM PHOSPHATE 10 MG/ML IJ SOLN
10.0000 mg | Freq: Once | INTRAMUSCULAR | Status: AC
  Administered 2012-10-31: 10 mg via INTRAVENOUS
  Filled 2012-10-30: qty 1

## 2012-10-30 MED ORDER — BUDESONIDE-FORMOTEROL FUMARATE 160-4.5 MCG/ACT IN AERO
2.0000 | INHALATION_SPRAY | Freq: Two times a day (BID) | RESPIRATORY_TRACT | Status: DC
  Administered 2012-10-30 – 2012-11-02 (×6): 2 via RESPIRATORY_TRACT
  Filled 2012-10-30: qty 6

## 2012-10-30 SURGICAL SUPPLY — 51 items
BAG SPEC THK2 15X12 ZIP CLS (MISCELLANEOUS) ×1
BAG ZIPLOCK 12X15 (MISCELLANEOUS) ×2 IMPLANT
BANDAGE GAUZE ELAST BULKY 4 IN (GAUZE/BANDAGES/DRESSINGS) ×2 IMPLANT
BIT DRILL 4.3MMS DISTAL GRDTED (BIT) IMPLANT
CLOTH BEACON ORANGE TIMEOUT ST (SAFETY) ×2 IMPLANT
DRAPE INCISE IOBAN 66X45 STRL (DRAPES) ×2 IMPLANT
DRAPE LG THREE QUARTER DISP (DRAPES) ×1 IMPLANT
DRAPE STERI IOBAN 125X83 (DRAPES) ×2 IMPLANT
DRILL 4.3MMS DISTAL GRADUATED (BIT) ×2
DRSG AQUACEL AG ADV 3.5X 4 (GAUZE/BANDAGES/DRESSINGS) ×2 IMPLANT
DRSG AQUACEL AG ADV 3.5X 6 (GAUZE/BANDAGES/DRESSINGS) ×1 IMPLANT
DRSG MEPILEX BORDER 4X12 (GAUZE/BANDAGES/DRESSINGS) ×1 IMPLANT
DRSG MEPILEX BORDER 4X4 (GAUZE/BANDAGES/DRESSINGS) ×1 IMPLANT
DRSG PAD ABDOMINAL 8X10 ST (GAUZE/BANDAGES/DRESSINGS) ×4 IMPLANT
DURAPREP 26ML APPLICATOR (WOUND CARE) ×2 IMPLANT
ELECT REM PT RETURN 9FT ADLT (ELECTROSURGICAL) ×2
ELECTRODE REM PT RTRN 9FT ADLT (ELECTROSURGICAL) ×1 IMPLANT
GLOVE BIOGEL PI IND STRL 6.5 (GLOVE) IMPLANT
GLOVE BIOGEL PI IND STRL 7.5 (GLOVE) ×1 IMPLANT
GLOVE BIOGEL PI IND STRL 8 (GLOVE) ×1 IMPLANT
GLOVE BIOGEL PI INDICATOR 6.5 (GLOVE) ×1
GLOVE BIOGEL PI INDICATOR 7.5 (GLOVE) ×2
GLOVE BIOGEL PI INDICATOR 8 (GLOVE) ×1
GLOVE ECLIPSE 8.0 STRL XLNG CF (GLOVE) ×3 IMPLANT
GLOVE ORTHO TXT STRL SZ7.5 (GLOVE) ×4 IMPLANT
GLOVE SURG ORTHO 8.0 STRL STRW (GLOVE) ×2 IMPLANT
GLOVE SURG SS PI 7.0 STRL IVOR (GLOVE) ×1 IMPLANT
GLOVE SURG SS PI 7.5 STRL IVOR (GLOVE) ×2 IMPLANT
GOWN BRE IMP PREV XXLGXLNG (GOWN DISPOSABLE) ×3 IMPLANT
GOWN STRL NON-REIN LRG LVL3 (GOWN DISPOSABLE) ×2 IMPLANT
GOWN STRL REIN XL XLG (GOWN DISPOSABLE) ×2 IMPLANT
GUIDEPIN 3.2X17.5 THRD DISP (PIN) ×2 IMPLANT
GUIDEWIRE BALL NOSE 100CM (WIRE) ×1 IMPLANT
KIT BASIN OR (CUSTOM PROCEDURE TRAY) ×2 IMPLANT
MANIFOLD NEPTUNE II (INSTRUMENTS) ×2 IMPLANT
NAIL AFFIXUS RT 11X340MM (Nail) ×1 IMPLANT
PACK GENERAL/GYN (CUSTOM PROCEDURE TRAY) ×2 IMPLANT
POSITIONER SURGICAL ARM (MISCELLANEOUS) ×2 IMPLANT
SCREW BONE CORTICAL 5.0X38 (Screw) ×1 IMPLANT
SCREW LAG 10.5MMX105MM HFN (Screw) ×1 IMPLANT
SPONGE GAUZE 4X4 12PLY (GAUZE/BANDAGES/DRESSINGS) ×2 IMPLANT
SPONGE LAP 18X18 X RAY DECT (DISPOSABLE) ×2 IMPLANT
SPONGE LAP 4X18 X RAY DECT (DISPOSABLE) IMPLANT
STAPLER VISISTAT 35W (STAPLE) ×2 IMPLANT
SUT VIC AB 1 CT1 27 (SUTURE) ×2
SUT VIC AB 1 CT1 27XBRD ANTBC (SUTURE) ×1 IMPLANT
SUT VIC AB 1 CT1 36 (SUTURE) ×1 IMPLANT
SUT VIC AB 2-0 CT1 27 (SUTURE) ×4
SUT VIC AB 2-0 CT1 27XBRD (SUTURE) ×2 IMPLANT
TOWEL OR 17X26 10 PK STRL BLUE (TOWEL DISPOSABLE) ×4 IMPLANT
WATER STERILE IRR 1500ML POUR (IV SOLUTION) ×1 IMPLANT

## 2012-10-30 NOTE — Brief Op Note (Signed)
10/30/2012  3:17 PM  PATIENT:  Tracey Nguyen  72 y.o. 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    PROCEDURE:  Procedure(s): Revision open reduction internal fixation right proximal femur fracture Removal deep implant, 2 proximal screws, 1 distal screw and broken intramedullary nail  Changed to long Affixus nail  SURGEON:  Surgeon(s) and Role:    * [name], MD - Primary  PHYSICIAN ASSISTANT: Lanney Gins, PA-C  ANESTHESIA:   general  EBL:  Total I/O In: 2000 [I.V.:2000] Out: 750 [Urine:200; Blood:550]  BLOOD ADMINISTERED:none  DRAINS: none   LOCAL MEDICATIONS USED:  NONE  SPECIMEN:  No Specimen  DISPOSITION OF SPECIMEN:  N/A  COUNTS:  YES  TOURNIQUET:  * No tourniquets in log *  DICTATION: .Other Dictation: Dictation Number [id number]  PLAN OF CARE: Admit to inpatient   PATIENT DISPOSITION:  PACU - hemodynamically stable.   Delay start of Pharmacological VTE agent (>24hrs) due to surgical blood loss or risk of bleeding: no

## 2012-10-30 NOTE — Transfer of Care (Signed)
Immediate Anesthesia Transfer of Care Note  Patient: Tracey Nguyen  Procedure(s) Performed: Procedure(s): revision right femur INTRAMEDULLARY NAILING (Right)  Patient Location: PACU  Anesthesia Type:General  Level of Consciousness: awake, sedated and patient cooperative  Airway & Oxygen Therapy: Patient Spontanous Breathing and Patient connected to face mask oxygen  Post-op Assessment: Report given to PACU RN and Post -op Vital signs reviewed and stable  Post vital signs: Reviewed and stable  Complications: No apparent anesthesia complications

## 2012-10-30 NOTE — Progress Notes (Signed)
ALL TOES- X10 - ON BOTH FEET WARMING UP - COLOR PINK- - RIGHT AND LEFT FOOT DORSALIS PEDIS PULSES NOW PALPABLE.

## 2012-10-30 NOTE — Interval H&P Note (Signed)
History and Physical Interval Note:  10/30/2012 11:24 AM  Tracey Nguyen  has presented today for surgery, with the diagnosis of right femur im nail failure   The various methods of treatment have been discussed with the patient and family. After consideration of risks, benefits and other options for treatment, the patient has consented to  Procedure(s): revision right femur INTRAMEDULLARY NAILING (Right) as a surgical intervention .  The patient's history has been reviewed, patient examined, no change in status, stable for surgery.  I have reviewed the patient's chart and labs.  Questions were answered to the patient's satisfaction.     [name]

## 2012-10-30 NOTE — Anesthesia Postprocedure Evaluation (Signed)
Anesthesia Post Note  Patient: Tracey Nguyen  Procedure(s) Performed: Procedure(s) (LRB): revision right femur INTRAMEDULLARY NAILING (Right)  Anesthesia type: general  Patient location: PACU  Post pain: Pain level controlled  Post assessment: Patient's Cardiovascular Status Stable  Last Vitals:  Filed Vitals:   10/30/12 1615  BP: 111/54  Pulse: 73  Temp: 36.3 C  Resp: 19    Post vital signs: Reviewed and stable  Level of consciousness: sedated  Complications: No apparent anesthesia complications

## 2012-10-30 NOTE — Anesthesia Preprocedure Evaluation (Signed)
Anesthesia Evaluation  Patient identified by MRN, date of birth, ID band Patient awake    Reviewed: Allergy & Precautions, H&P , NPO status , Patient's Chart, lab work & pertinent test results, reviewed documented beta blocker date and time   History of Anesthesia Complications (+) PROLONGED EMERGENCE  Airway Mallampati: III TM Distance: >3 FB Neck ROM: full    Dental   Pulmonary asthma , sleep apnea , pneumonia -, resolved, PE breath sounds clear to auscultation        Cardiovascular hypertension, On Medications and On Home Beta Blockers Rhythm:regular     Neuro/Psych  Headaches, PSYCHIATRIC DISORDERS  Neuromuscular disease    GI/Hepatic Neg liver ROS, GERD-  Medicated and Controlled,  Endo/Other  Hyperthyroidism   Renal/GU negative Renal ROS  negative genitourinary   Musculoskeletal   Abdominal   Peds  Hematology negative hematology ROS (+)   Anesthesia Other Findings See surgeon's H&P   Reproductive/Obstetrics negative OB ROS                           Anesthesia Physical Anesthesia Plan  ASA: III  Anesthesia Plan: General   Post-op Pain Management:    Induction: Intravenous  Airway Management Planned: Oral ETT  Additional Equipment:   Intra-op Plan:   Post-operative Plan: Extubation in OR  Informed Consent: I have reviewed the patients History and Physical, chart, labs and discussed the procedure including the risks, benefits and alternatives for the proposed anesthesia with the patient or authorized representative who has indicated his/her understanding and acceptance.   Dental Advisory Given  Plan Discussed with: CRNA and Surgeon  Anesthesia Plan Comments:         Anesthesia Quick Evaluation

## 2012-10-30 NOTE — Progress Notes (Signed)
Dr. Charlann Boxer made aware of patient's toes x10- on both feet being dusky- colored- Right dorsalis pedis pulse palpable; left dorsalis pedis pulse heard with doppler- warm blankets.

## 2012-10-30 NOTE — Op Note (Signed)
Tracey Nguyen, Tracey Nguyen NO.:  [id number]  MEDICAL RECORD NO.:  [id number]  LOCATION:  1617                         FACILITY:  [facility]  PHYSICIAN:  Madlyn Frankel. Charlann Boxer, M.D.  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  PROCEDURES: 1. Revision open reduction and internal fixation of right proximal     femur fracture. 2. Removal of deep implants including two proximal screws, one distal     interlock and the broken intramedullary nail. 3. Nonunion takedown.  Components used at this point were [employer] AFFIXUS hip system with a 34 x 340-mm x 11-mm nail with 130-degree lag screw.  SURGEON:  Madlyn Frankel. Charlann Boxer, M.D.  ASSISTANT:  Lanney Gins, PA-C.  Note that Mr. Tracey Nguyen was present for the entirety of the case, management of operative extremity, general facilitation of the case, assistance with reduction and passage of nails, general facilitation of the case and primary wound closure.  ANESTHESIA:  General.  SPECIMENS:  None.  COMPLICATIONS:  None apparent.  FINDINGS:  The patient was noted to have nonunion to the proximal femur. Fracture line was still evident with fibrous union present.  The patient had very thin lateral cortex basically nonexistent on the proximal aspect of her trochanter.  COMPLICATIONS:  None.  INDICATIONS FOR PROCEDURE:  Tracey Nguyen is a 72 year old female, who at this point, had two failed attempts at union of her proximal femur.  She had a procedure performed by myself greater than a year and a half ago without significant pain.  She did improve her overall functionality; however, presented recently with increasing pain with twisting mechanism.   Radiographs in the office revealed the fracture to the proximal aspect of her nail as well as the visible fracture line particularly laterally.  Given the acuity of these events, we discussed options of removal of deep implants and converting to her total hip replacement versus a third attempt at getting her fracture to heal.  Given her options, she and her husband decided for one more attempt to salvaging her hip joint.  The risks of failure, further complication related to total hip replacement, after that, we were all reviewed and discussed.  Consent was obtained for this stage of the procedure.  Standard risks of infection, DVT, and further failure reviewed and consent was obtained for benefit of fracture and management of pain relief.  PROCEDURE IN DETAIL:  The patient was brought to the operative theater. Once adequate anesthesia, preoperative antibiotics, Ancef administered, she was positioned supine on the fracture table.  Her left unaffected extremity was flexed and abducted out of the way.  The right foot was placed in traction boot.  Fluoroscopy was brought into the room to confirm orientation as well as positioning for procedure based radiography.  At this point, the right hip was prepped and draped in a sterile fashion using shower curtain technique from the iliac crest to below the knee. The time-out was performed identifying the patient, planned procedure, and  extremity.  Applied application of the final drape, I did identify old incisions with a plan to make a larger based proximal incision to help with the removal of the implant also directly visualizing fracture sites for debridement purposes as well as nonunion takedown.  At this point, I first removed the distal interlock.  The previous incision was identified and then under fluoroscopy, confirmed orientation.  The final screwdriver was positioned and the screw removed without difficulty.  At this point, I  made an incision on the proximal aspect of the hip and incising the old scar.  Sharp dissection was carried down to the iliotibial band and gluteal fascia, which was then incised longitudinally.  Soft tissue dissection was carried down through the gluteus medius as it inserts on the trochanter identifying the cross locking screws for this VersaNail.  These two lateral screws were removed without difficulty.  Attention now was directed to removal of the intramedullary nail.  The proximal segment was very visible and was removed easily through the proximal aspect of the insertion site.  There was preoperative preparation as well as intraoperatively discussion about how to remove this broken segment of the nail.  I was able to pass a guidewire into the nail as a ball-tipped guidewire was passed and we tried to figure out a way to possibly try to wedges.  Then, we actually did not have an appropriate second-sized guidewire to pass in far enough down to wedge, the ball tip into the nail using an extraction device.  Given the nonunion present at this proximal lateral tibia near the area of the fractured nail, I used an osteotome 5.8 and took down the old nonunion site.  I was able to elevate the proximal femoral segment out of the way of the shaft of the femur and subsequently was able to remove the nail through this site.  Once the nail was removed, I used a curette and removed old fibrous tissue as well as some metal debris present within the area of the fracture site.  At this point, I decided to not get back in with the Versa-type nail or reconstruction-type nail, but instead with an AFFIXUS nail with the larger fixation device.  Trying to improve the angle of the neck shaft angle, but changes the orientation for fixation.  Again noting by palpation directly as well as radiographically that the proximal aspect of her nail was more lateral than needed to be; however, the bone quality  laterally was not very compromised based on the thinness of it.  I did pass using the starting awl and I was able to pass the guidewire into the distal femur, selected to use again the 34-cm nail.  I reamed up to 12 mm at this point to stimulate bleeding into the proximal aspect of the femur.  Thus, the 11 x 340-mm nail was opened and attached to the jig.  I found that when I needed to pass the jig to an appropriate depth to get purchase of the lateral cortex of the proximal femur that I had to pass the nail a bit further than reaming had allowed.  I ended up reaming with the flexible reamers to a 16.5-mm reamer to allow for the proximal aspect of the nail to be seated appropriately.  With the nail in appropriate depth, placed utilizing the insertion jig, a guidewire was passed through this lateral cortex of the proximal femur into the inferior aspect of the neck.  I had selected a 130-degree lag  screw and the neck-shaft angle was definitely in more varus compared to this, but probably improved from her preoperative state.  My measured the depth and selected a 105-mm lag screw.  I drilled the lateral cortex into the femoral head and neck, and then passed the screw.  The screw had good purchase laterally.  I ended up locking this down with AFFIXUS angle device to prevent any further complications or screws back out  Under perfect circle technique, a distal interlock was placed measuring 34 mm, this was distal to the previous place screw.  The final radiographs were obtained in AP and lateral planes.  We irrigated all wounds.  The distal wounds were closed with 2-0 Vicryl and staples.  The long proximal incision was reapproximated using #1 Vicryl on the vastus lateralis fascia, then, the iliotibial band and gluteus fascia overlying this.  The remainder of the wound was closed with 2-0 Vicryl and staples on the skin.  The skin was cleaned, dried and dressed sterilely using  Mepilex dressing.  She was then brought to the recovery room in stable condition tolerating the procedure well.  She will remain in the hospital for 2-3 days with consideration of nursing facility.  I will have her touchdown weightbearing on this right lower extremity, yet to be specified at period of time at least 6-8 weeks to allow for attempted healing. Questions, procedure and findings will be reviewed with family and the patient in the morning.     Madlyn Frankel Charlann Boxer, M.D.     MDO/MEDQ  D:  10/30/2012  T:  10/30/2012  Job:  [id number]

## 2012-10-31 ENCOUNTER — Encounter (HOSPITAL_COMMUNITY): Payer: Self-pay | Admitting: Orthopedic Surgery

## 2012-10-31 DIAGNOSIS — D62 Acute posthemorrhagic anemia: Secondary | ICD-10-CM

## 2012-10-31 DIAGNOSIS — E669 Obesity, unspecified: Secondary | ICD-10-CM | POA: Diagnosis present

## 2012-10-31 LAB — CBC
HCT: 23.6 % — ABNORMAL LOW (ref 36.0–46.0)
Hemoglobin: 7.4 g/dL — ABNORMAL LOW (ref 12.0–15.0)
MCH: 20.5 pg — ABNORMAL LOW (ref 26.0–34.0)
MCV: 65.4 fL — ABNORMAL LOW (ref 78.0–100.0)
Platelets: 149 10*3/uL — ABNORMAL LOW (ref 150–400)
RBC: 3.61 MIL/uL — ABNORMAL LOW (ref 3.87–5.11)
WBC: 7.2 10*3/uL (ref 4.0–10.5)

## 2012-10-31 LAB — BASIC METABOLIC PANEL
CO2: 26 mEq/L (ref 19–32)
Calcium: 8.4 mg/dL (ref 8.4–10.5)
Chloride: 106 mEq/L (ref 96–112)
Creatinine, Ser: 1.36 mg/dL — ABNORMAL HIGH (ref 0.50–1.10)
Glucose, Bld: 102 mg/dL — ABNORMAL HIGH (ref 70–99)

## 2012-10-31 MED ORDER — SODIUM CHLORIDE 0.9 % IV BOLUS (SEPSIS)
250.0000 mL | Freq: Once | INTRAVENOUS | Status: AC
Start: 2012-10-31 — End: 2012-10-31
  Administered 2012-10-31: 250 mL via INTRAVENOUS

## 2012-10-31 MED ORDER — TRAMADOL HCL 50 MG PO TABS
50.0000 mg | ORAL_TABLET | Freq: Four times a day (QID) | ORAL | Status: DC | PRN
  Administered 2012-10-31: 100 mg via ORAL
  Administered 2012-10-31: 50 mg via ORAL
  Administered 2012-11-01: 100 mg via ORAL
  Administered 2012-11-01 – 2012-11-02 (×2): 50 mg via ORAL
  Filled 2012-10-31: qty 1
  Filled 2012-10-31: qty 2
  Filled 2012-10-31: qty 1
  Filled 2012-10-31 (×2): qty 2

## 2012-10-31 NOTE — Progress Notes (Signed)
RT set patient up on CPAP. Patient has home nasal mask. Set machine to auto titrate 5cm H2O min to 16cm H2O max. 2L of oxygen bled in. Sterile water to fill line. Husband at bedside. Patient and husband aware to call should they need any further assistance.

## 2012-10-31 NOTE — Progress Notes (Addendum)
   Subjective: 1 Day Post-Op Procedure(s) (LRB): revision right femur INTRAMEDULLARY NAILING (Right)   Patient reports pain as mild, pain well controlled. She states that hip pain is much better than it was prior to surgery. She has had very low urinary output, BP had been a little low and H&H low. Otherwise no events throughout the night. She feels that SNF might be better for her to get some extra assistance to help her until she is strong enough to be more independent.  Objective:   VITALS:   Filed Vitals:   10/31/12  BP: 99/64  Pulse: 85  Temp: 99.6 F (37.6 C)  Resp: 16    Neurovascular intact Dorsiflexion/Plantar flexion intact Incision: dressing C/D/I No cellulitis present Compartment soft  LABS  Recent Labs  10/31/12 0505  HGB 7.4*  HCT 23.6*  WBC 7.2  PLT 149*     Recent Labs  10/31/12 0505  NA 138  K 3.8  BUN 20  CREATININE 1.36*  GLUCOSE 102*     Assessment/Plan: 1 Day Post-Op Procedure(s) (LRB): revision right femur INTRAMEDULLARY NAILING (Right) Receiving 2 units of blood Foley can be maintained until tomorrow morning. Advance diet Up with therapy Discharge to SNF eventually, when ready  ABLA  Treated with 2 units of blood and will observe  Obese (BMI 30-39.9) Estimated body mass index is 33.45 kg/(m^2) as calculated from the following:   Height as of this encounter: 4\' 10"  (1.473 m).   Weight as of this encounter: 72.576 kg (160 lb). Patient also counseled that weight may inhibit the healing process Patient counseled that losing weight will help with future health issues     Anastasio Auerbach. Shaliah Wann   PAC  10/31/2012, 1:13 PM

## 2012-10-31 NOTE — Evaluation (Signed)
Physical Therapy Evaluation Patient Details Name: Tracey Nguyen MRN: [id number] DOB: [birth year] Today's Date: 10/31/2012 Time: 1150-1230 PT Time Calculation (min): 40 min  PT Assessment / Plan / Recommendation Clinical Impression  Pt s/p ORIF R hip presents with decreased R LE strength/ROM, post op pain and TDWB limiting functional mobility    PT Assessment  Patient needs continued PT services    Follow Up Recommendations  CIR    Does the patient have the potential to tolerate intense rehabilitation      Barriers to Discharge        Equipment Recommendations  None recommended by PT    Recommendations for Other Services OT consult   Frequency Min 5X/week    Precautions / Restrictions Precautions Precautions: Fall Restrictions Weight Bearing Restrictions: Yes RLE Weight Bearing: Touchdown weight bearing Other Position/Activity Restrictions: R LE   Pertinent Vitals/Pain       Mobility  Bed Mobility Bed Mobility: Supine to Sit;Sitting - Scoot to Edge of Bed;Sit to Supine Supine to Sit: 1: +2 Total assist;HOB elevated;With rails Supine to Sit: Patient Percentage: 50% Sitting - Scoot to Edge of Bed: 2: Max assist;With rail;Other (comment) (Therapist assisted w/ pad) Sit to Supine: 1: +2 Total assist;HOB flat;Other (comment) (Upper and lower body assist) Sit to Supine: Patient Percentage: 0% Details for Bed Mobility Assistance: Assistance with pad in both supine to sit and sit to supine (UB/LB assist +2). Pt using L LE & UE's to assist w/ repositioning in bed after lying back down, HOB flat. Transfers Transfers: Sit to Stand;Stand to Sit Sit to Stand: 1: +2 Total assist;From elevated surface;With upper extremity assist;From bed (x2; VC's for safety/sequencing, hand placement) Sit to Stand: Patient Percentage: 50% Stand to Sit: 1: +2 Total assist;With upper extremity assist;To elevated surface;To bed (x2; VC's for safety/sequencing & hand placement) Stand to Sit:  Patient Percentage: 50% Details for Transfer Assistance: Pt currently req +2 assist for safe sit-stand transfers from EOB elevated secondary to lethergy/receiving blood & R LE TDWB status. Pt somewhat anxious about functional mobility. Cont to assess. Ambulation/Gait Ambulation/Gait Assistance:  (Stood only x 2 )    Exercises General Exercises - Lower Extremity Ankle Circles/Pumps: AROM;10 reps;Both;Supine Quad Sets: AROM;10 reps;Both;Supine Heel Slides: AAROM;10 reps;Supine;Right Hip ABduction/ADduction: AAROM;Right;10 reps;Supine   PT Diagnosis: Difficulty walking  PT Problem List: Decreased strength;Decreased range of motion;Decreased activity tolerance;Decreased mobility;Decreased knowledge of use of DME;Obesity;Pain;Decreased knowledge of precautions PT Treatment Interventions: Gait training;DME instruction;Functional mobility training;Therapeutic activities;Therapeutic exercise;Patient/family education   PT Goals Acute Rehab PT Goals PT Goal Formulation: With patient Time For Goal Achievement: 11/06/12 Potential to Achieve Goals: Fair Pt will go Supine/Side to Sit: with min assist PT Goal: Supine/Side to Sit - Progress: Goal set today Pt will go Sit to Supine/Side: with min assist PT Goal: Sit to Supine/Side - Progress: Goal set today Pt will go Sit to Stand: with min assist PT Goal: Sit to Stand - Progress: Goal set today Pt will go Stand to Sit: with min assist PT Goal: Stand to Sit - Progress: Goal set today Pt will Ambulate: 1 - 15 feet;with +2 total assist;with rolling walker PT Goal: Ambulate - Progress: Goal set today  Visit Information  Last PT Received On: 10/31/12 Assistance Needed: +2 PT/OT Co-Evaluation/Treatment: Yes    Subjective Data  Subjective: This is my third time on this hip Patient Stated Goal: Rehab and home   Prior Functioning  Home Living Lives With: Spouse Available Help at Discharge: Family Type of Home: House  Home Access: Ramped  entrance Home Layout: One level Bathroom Shower/Tub: Tub/shower unit;Walk-in shower Bathroom Toilet: Standard Bathroom Accessibility: Yes How Accessible: Accessible via walker Home Adaptive Equipment: Bedside commode/3-in-1;Grab bars in shower;Tub transfer bench;Walker - rolling;Reacher Prior Function Level of Independence: Independent with assistive device(s) Needs Assistance: Bathing Bath: Minimal Communication Communication: No difficulties Dominant Hand: Right    Cognition  Cognition Overall Cognitive Status: Appears within functional limits for tasks assessed/performed Arousal/Alertness: Awake/alert Orientation Level: Appears intact for tasks assessed Behavior During Session: [facility] for tasks performed    Extremity/Trunk Assessment Right Upper Extremity Assessment RUE ROM/Strength/Tone: [facility] for tasks assessed Left Upper Extremity Assessment LUE ROM/Strength/Tone: WFL for tasks assessed Right Lower Extremity Assessment RLE ROM/Strength/Tone: Deficits RLE ROM/Strength/Tone Deficits: 2/5 with AAROM to 75 hip flex and 20 abd Left Lower Extremity Assessment LLE ROM/Strength/Tone: WFL for tasks assessed   Balance Balance Balance Assessed: Yes Static Sitting Balance Static Sitting - Balance Support: Bilateral upper extremity supported;Feet supported (L LLE supported) Static Sitting - Level of Assistance: 5: Stand by assistance;4: Min assist  End of Session PT - End of Session Equipment Utilized During Treatment: Gait belt Activity Tolerance: Patient limited by fatigue Patient left: in bed;with call bell/phone within reach Nurse Communication: Mobility status  GP     Denean Pavon 10/31/2012, 1:21 PM

## 2012-10-31 NOTE — Progress Notes (Signed)
Utilization review completed.  

## 2012-10-31 NOTE — Progress Notes (Signed)
Pt placed on auto-cpap with 3 LPM O2 bleed in, pt tolerating well at this time, RT to monitor and assess as needed.

## 2012-10-31 NOTE — Evaluation (Signed)
Occupational Therapy Evaluation Patient Details Name: Tracey Nguyen MRN: [id number] DOB: 1941/01/24 Today's Date: 10/31/2012 Time: 1478-2956 OT Time Calculation (min): 26 min  OT Assessment / Plan / Recommendation Clinical Impression  Pt is 71y/o female s/p removal of & implantation of new IM nail right hip. She is TDWB and currently requiring +2 assist secondary to adherence to WB status, low BP, receiving blood. She will benefit from acute OT followed by in pt Rehab vs SNF.    OT Assessment  Patient needs continued OT Services    Follow Up Recommendations  CIR;SNF (CIR vs SNF Rehab prior to return home)    Barriers to Discharge      Equipment Recommendations  None recommended by OT    Recommendations for Other Services Rehab consult  Frequency  Min 2X/week    Precautions / Restrictions Precautions Precautions: Fall Restrictions Weight Bearing Restrictions: Yes RLE Weight Bearing: Touchdown weight bearing Other Position/Activity Restrictions: R LE   Pertinent Vitals/Pain 0/10 pain at rest. With activity, pt states "No pain" but fatigue/lethergy.   ADL  Eating/Feeding: Performed;Independent Where Assessed - Eating/Feeding: Bed level Grooming: Performed;Wash/dry hands;Modified independent;Set up Where Assessed - Grooming: Supine, head of bed up Upper Body Bathing: Simulated;Minimal assistance Where Assessed - Upper Body Bathing: Supine, head of bed up Lower Body Bathing: Simulated;+2 Total assistance Lower Body Bathing: Patient Percentage: 40% Where Assessed - Lower Body Bathing: Supported sit to stand Upper Body Dressing: Performed;Set up Where Assessed - Upper Body Dressing: Unsupported sitting Lower Body Dressing: Performed;+2 Total assistance Lower Body Dressing: Patient Percentage: 40% Where Assessed - Lower Body Dressing: Supported sit to stand Toilet Transfer: Simulated;+2 Total assistance Toilet Transfer: Patient Percentage: 40% Statistician Method:  Surveyor, minerals: Materials engineer and Hygiene: Simulated;+2 Total assistance (Secondary to TDWB R LE) Toileting - Clothing Manipulation and Hygiene: Patient Percentage: 10% Where Assessed - Toileting Clothing Manipulation and Hygiene: Sit to stand from 3-in-1 or toilet;Standing Tub/Shower Transfer Method: Not assessed Equipment Used: Gait belt;Rolling walker;Sock aid;Reacher Transfers/Ambulation Related to ADLs: Pt sit to stand x2 w/ +2 assist from EOB in preparation for transfers. Pt fatigues easily & currently receiving blood. Cont to assess. ADL Comments: Pt was able to sit at EOB & perform ADL treatment session for LB dressing w/ A/E & Mod A for doff/don socks. Pt is overall +2 assist for sit-stand activities at this time secondary to R LE TDWB & fatigue/lethergy (receiving blood today). She will benefit from acute OT followed by in pt rehab vs SNF.    OT Diagnosis: Generalized weakness;Acute pain  OT Problem List: Decreased strength;Decreased safety awareness;Decreased activity tolerance;Impaired balance (sitting and/or standing);Decreased knowledge of use of DME or AE;Decreased knowledge of precautions;Pain OT Treatment Interventions: Self-care/ADL training;Energy conservation;DME and/or AE instruction;Patient/family education;Therapeutic activities   OT Goals Acute Rehab OT Goals OT Goal Formulation: With patient Potential to Achieve Goals: Good ADL Goals Pt Will Perform Lower Body Dressing: with set-up;with supervision;Sit to stand from chair;Sit to stand from bed;with adaptive equipment ADL Goal: Lower Body Dressing - Progress: Goal set today Pt Will Transfer to Toilet: with supervision;with min assist;Ambulation;3-in-1;with DME;Maintaining weight bearing status ADL Goal: Toilet Transfer - Progress: Goal set today Pt Will Perform Toileting - Clothing Manipulation: with min assist;with supervision;Sitting on 3-in-1 or  toilet;Standing ADL Goal: Toileting - Clothing Manipulation - Progress: Goal set today Pt Will Perform Toileting - Hygiene: with modified independence;Sitting on 3-in-1 or toilet ADL Goal: Toileting - Hygiene - Progress: Goal set today  Additional ADL Goal #1: Pt will be I A/E use during ADL's to assist with maximizing independence and return to PLOF. ADL Goal: Additional Goal #1 - Progress: Goal set today  Visit Information  Last OT Received On: 10/31/12 Assistance Needed: +2 PT/OT Co-Evaluation/Treatment: Yes    Subjective Data  Subjective: Pt agreeable to OT assessment this pm Patient Stated Goal: Pt would like to go to in pt rehab unit but is open to SNF Rehab   Prior Functioning     Home Living Lives With: Spouse Available Help at Discharge: Family Type of Home: House Home Access: Ramped entrance Home Layout: One level Bathroom Shower/Tub: Tub/shower unit;Walk-in shower Bathroom Toilet: Standard Bathroom Accessibility: Yes How Accessible: Accessible via walker Home Adaptive Equipment: Bedside commode/3-in-1;Grab bars in shower;Tub transfer bench;Walker - rolling;Reacher Prior Function Level of Independence: Independent with assistive device(s) Needs Assistance: Bathing Bath: Minimal Communication Communication: No difficulties Dominant Hand: Right    Vision/Perception Vision - History Baseline Vision: Wears glasses all the time Patient Visual Report: No change from baseline   Cognition  Cognition Overall Cognitive Status: Appears within functional limits for tasks assessed/performed Arousal/Alertness: Awake/alert Orientation Level: Appears intact for tasks assessed Behavior During Session: [facility] for tasks performed    Extremity/Trunk Assessment Right Upper Extremity Assessment RUE ROM/Strength/Tone: [facility] for tasks assessed Left Upper Extremity Assessment LUE ROM/Strength/Tone: [facility] for tasks assessed     Mobility Bed Mobility Bed Mobility: Supine to  Sit;Sitting - Scoot to Edge of Bed;Sit to Supine Supine to Sit: 1: +2 Total assist;HOB elevated;With rails Supine to Sit: Patient Percentage: 50% Sitting - Scoot to Edge of Bed: 2: Max assist;With rail;Other (comment) (Therapist assisted w/ pad) Sit to Supine: 1: +2 Total assist;HOB flat;Other (comment) (Upper and lower body assist) Sit to Supine: Patient Percentage: 0% Details for Bed Mobility Assistance: Assistance with pad in both supine to sit and sit to supine (UB/LB assist +2). Pt using L LE & UE's to assist w/ repositioning in bed after lying back down, HOB flat. Transfers Transfers: Sit to Stand;Stand to Sit Sit to Stand: 1: +2 Total assist;From elevated surface;With upper extremity assist;From bed (x2; VC's for safety/sequencing, hand placement) Sit to Stand: Patient Percentage: 50% Stand to Sit: 1: +2 Total assist;With upper extremity assist;To elevated surface;To bed (x2; VC's for safety/sequencing & hand placement) Stand to Sit: Patient Percentage: 50% Details for Transfer Assistance: Pt currently req +2 assist for safe sit-stand transfers from EOB elevated secondary to lethergy/receiving blood & R LE TDWB status. Pt somewhat anxious about functional mobility. Cont to assess.        Balance Balance Balance Assessed: Yes Static Sitting Balance Static Sitting - Balance Support: Bilateral upper extremity supported;Feet supported (L LLE supported) Static Sitting - Level of Assistance: 5: Stand by assistance;4: Min assist   End of Session OT - End of Session Equipment Utilized During Treatment: Gait belt;Other (comment) (RW, long handled reacher, sockaid) Activity Tolerance: Patient tolerated treatment well Patient left: in bed;with call bell/phone within reach;with family/visitor present  GO     Roselie Awkward Dixon 10/31/2012, 1:03 PM

## 2012-10-31 NOTE — Progress Notes (Signed)
Clinical Social Work Department BRIEF PSYCHOSOCIAL ASSESSMENT 10/31/2012  Patient:  Tracey Nguyen, Tracey Nguyen     Account Number:  [id number]     Admit date:  10/30/2012  Clinical Social Worker:  Candie Chroman  Date/Time:  10/31/2012 03:19 PM  Referred by:  Physician  Date Referred:  10/31/2012 Referred for  SNF Placement   Other Referral:   Interview type:  Patient Other interview type:    PSYCHOSOCIAL DATA Living Status:  HUSBAND Admitted from facility:   Level of care:   Primary support name:  Aneta Mins Primary support relationship to patient:  SPOUSE Degree of support available:   supportive    CURRENT CONCERNS Current Concerns  Post-Acute Placement   Other Concerns:    SOCIAL WORK ASSESSMENT / PLAN Pt is a 72 yr old female living at home prior to hospitalization. CSW met with pt / spouse to assist with d/c planning. Pt is interested in CIR but will consider ST SNF placement if CIR is unable to assist. Pt has been to CIR in the past and had a very good experience. CSW has initiated SNF search and will provide bed offers when available.   Assessment/plan status:  Psychosocial Support/Ongoing Assessment of Needs Other assessment/ plan:   CIR vs SNF   Information/referral to community resources:   SNF list    PATIENT'S/FAMILY'S RESPONSE TO PLAN OF CARE: Pt is hoping to have rehab at [facility] but will consider SNF if necessary.    Cori Razor LCSW [phone]

## 2012-10-31 NOTE — Progress Notes (Signed)
Rehab Admissions Coordinator Note:  Patient was screened by Meryl Dare for appropriateness for an Inpatient Acute Rehab Consult.  At this time, we are recommending Inpatient Rehab consult.  Meryl Dare 10/31/2012, 2:03 PM  I can be reached at [phone].

## 2012-11-01 LAB — CBC
MCH: 22.1 pg — ABNORMAL LOW (ref 26.0–34.0)
MCV: 68.2 fL — ABNORMAL LOW (ref 78.0–100.0)
Platelets: 133 10*3/uL — ABNORMAL LOW (ref 150–400)
RBC: 4.56 MIL/uL (ref 3.87–5.11)
RDW: 17.1 % — ABNORMAL HIGH (ref 11.5–15.5)

## 2012-11-01 LAB — BASIC METABOLIC PANEL
CO2: 27 mEq/L (ref 19–32)
Calcium: 8.5 mg/dL (ref 8.4–10.5)
Creatinine, Ser: 1.34 mg/dL — ABNORMAL HIGH (ref 0.50–1.10)
GFR calc Af Amer: 45 mL/min — ABNORMAL LOW (ref >90)
GFR calc non Af Amer: 39 mL/min — ABNORMAL LOW (ref >90)
Sodium: 137 mEq/L (ref 135–145)

## 2012-11-01 LAB — TYPE AND SCREEN
ABO/RH(D): O POS
Antibody Screen: NEGATIVE
Unit division: 0

## 2012-11-01 MED ORDER — MAGIC MOUTHWASH
15.0000 mL | Freq: Three times a day (TID) | ORAL | Status: DC
  Administered 2012-11-01 – 2012-11-02 (×4): 15 mL via ORAL
  Filled 2012-11-01 (×6): qty 15

## 2012-11-01 NOTE — Progress Notes (Signed)
   Subjective: 2 Days Post-Op Procedure(s) (LRB): revision right femur INTRAMEDULLARY NAILING (Right)   Patient reports pain as mild, pain well controlled. No events throughout the night. Would like to be considered for inpatient rehab.  Objective:   VITALS:   Filed Vitals:   11/01/12 0547  BP: 106/75  Pulse: 59  Temp: 98 F (36.7 C)  Resp: 16    Neurovascular intact Dorsiflexion/Plantar flexion intact Incision: dressing C/D/I No cellulitis present Compartment soft  LABS  Recent Labs  10/31/12 0505 11/01/12 0430  HGB 7.4* 10.1*  HCT 23.6* 31.1*  WBC 7.2 8.3  PLT 149* 133*     Recent Labs  10/31/12 0505 11/01/12 0430  NA 138 137  K 3.8 4.2  BUN 20 21  CREATININE 1.36* 1.34*  GLUCOSE 102* 148*     Assessment/Plan: 2 Days Post-Op Procedure(s) (LRB): revision right femur INTRAMEDULLARY NAILING (Right) Up with therapy Discharge to SNF/rehab eventually TDWB right leg  ABLA  Treated with 2 units of blood yesterday, H&H have increase. Will observe.  Obese (BMI 30-39.9)  Estimated body mass index is 33.45 kg/(m^2) as calculated from the following:     Height as of this encounter: 4\' 10"  (1.473 m).     Weight as of this encounter: 72.576 kg (160 lb).  Patient also counseled that weight may inhibit the healing process  Patient counseled that losing weight will help with future health issues    Anastasio Auerbach. Blasa Raisch   PAC  11/01/2012, 12:53 PM

## 2012-11-01 NOTE — Progress Notes (Signed)
Physical Therapy Treatment Patient Details Name: Tracey Nguyen MRN: [id number] DOB: [birth year] Today's Date: 11/01/2012 Time: 7829-5621 PT Time Calculation (min): 13 min  PT Assessment / Plan / Recommendation Comments on Treatment Session       Follow Up Recommendations  CIR     Does the patient have the potential to tolerate intense rehabilitation     Barriers to Discharge        Equipment Recommendations  None recommended by PT    Recommendations for Other Services OT consult  Frequency Min 5X/week   Plan Discharge plan remains appropriate    Precautions / Restrictions Precautions Precautions: Fall Restrictions Weight Bearing Restrictions: Yes RLE Weight Bearing: Touchdown weight bearing Other Position/Activity Restrictions: R LE   Pertinent Vitals/Pain Min c/o pain.    Mobility  Bed Mobility Bed Mobility: Sit to Supine Sitting - Scoot to Edge of Bed: 3: Mod assist;4: Min assist Details for Bed Mobility Assistance: cues for use of L LE and sequencing, pt assisted with pad to EOB Transfers Transfers: Sit to Stand;Stand to Sit Sit to Stand: 1: +2 Total assist;From chair/3-in-1;With upper extremity assist Sit to Stand: Patient Percentage: 60% Stand to Sit: 1: +2 Total assist;To bed;With upper extremity assist Stand to Sit: Patient Percentage: 70% Details for Transfer Assistance: Cues for LE management and use of UEs to self assist Ambulation/Gait Ambulation/Gait Assistance: 1: +2 Total assist Ambulation/Gait: Patient Percentage: 70% Ambulation Distance (Feet): 11 Feet Assistive device: Rolling walker Ambulation/Gait Assistance Details: min cues for posture, position from RW and sequence  Gait Pattern: Step-to pattern;Decreased step length - right;Decreased step length - left;Decreased stance time - right General Gait Details: Pt doing remarkably well complying with TWB Stairs: No    Exercises     PT Diagnosis:    PT Problem List:   PT Treatment  Interventions:     PT Goals Acute Rehab PT Goals PT Goal Formulation: With patient Time For Goal Achievement: 11/06/12 Potential to Achieve Goals: Fair Pt will go Supine/Side to Sit: with min assist PT Goal: Supine/Side to Sit - Progress: Progressing toward goal Pt will go Sit to Supine/Side: with min assist PT Goal: Sit to Supine/Side - Progress: Progressing toward goal Pt will go Sit to Stand: with min assist PT Goal: Sit to Stand - Progress: Progressing toward goal Pt will go Stand to Sit: with min assist PT Goal: Stand to Sit - Progress: Progressing toward goal Pt will Ambulate: 1 - 15 feet;with +2 total assist;with rolling walker PT Goal: Ambulate - Progress: Progressing toward goal  Visit Information  Last PT Received On: 11/01/12 Assistance Needed: +2    Subjective Data  Subjective: I'm ready to lay down Patient Stated Goal: Rehab and home   Cognition  Cognition Overall Cognitive Status: Appears within functional limits for tasks assessed/performed Arousal/Alertness: Awake/alert Orientation Level: Appears intact for tasks assessed Behavior During Session: [facility] for tasks performed    Balance     End of Session PT - End of Session Equipment Utilized During Treatment: Gait belt Activity Tolerance: Patient tolerated treatment well Patient left: in bed;with call bell/phone within reach Nurse Communication: Mobility status   GP     Carlo Guevarra 11/01/2012, 4:56 PM

## 2012-11-01 NOTE — Progress Notes (Signed)
Physical Therapy Treatment Patient Details Name: ORVETTA DANIELSKI MRN: [id number] DOB: 11-19-1940 Today's Date: 11/01/2012 Time: 1478-2956 PT Time Calculation (min): 36 min  PT Assessment / Plan / Recommendation Comments on Treatment Session       Follow Up Recommendations  CIR     Does the patient have the potential to tolerate intense rehabilitation     Barriers to Discharge        Equipment Recommendations  None recommended by PT    Recommendations for Other Services OT consult  Frequency Min 5X/week   Plan Discharge plan remains appropriate    Precautions / Restrictions Precautions Precautions: Fall Restrictions Weight Bearing Restrictions: Yes RLE Weight Bearing: Touchdown weight bearing Other Position/Activity Restrictions: R LE   Pertinent Vitals/Pain 3/10, premed, ice pack provided    Mobility  Bed Mobility Bed Mobility: Supine to Sit Supine to Sit: 3: Mod assist Sitting - Scoot to Edge of Bed: 3: Mod assist;4: Min assist Details for Bed Mobility Assistance: cues for use of L LE and sequencing, pt assisted with pad to EOB Transfers Transfers: Sit to Stand;Stand to Sit Sit to Stand: 1: +2 Total assist;From elevated surface;With upper extremity assist;From bed;From chair/3-in-1 Sit to Stand: Patient Percentage: 60% Stand to Sit: 1: +2 Total assist;With upper extremity assist;To chair/3-in-1;With armrests Stand to Sit: Patient Percentage: 70% Details for Transfer Assistance: Cues for LE management and use of UEs to self assist Ambulation/Gait Ambulation/Gait Assistance: 1: +2 Total assist Ambulation/Gait: Patient Percentage: 70% Ambulation Distance (Feet): 9 Feet (and 3) Assistive device: Rolling walker Ambulation/Gait Assistance Details: cues for posture, sequence, stride length and TWB Gait Pattern: Step-to pattern;Decreased step length - right;Decreased step length - left;Decreased stance time - right General Gait Details: Pt doing remarkably well  complying with TWB Stairs: No    Exercises General Exercises - Lower Extremity Ankle Circles/Pumps: AROM;Both;Supine;15 reps Quad Sets: AROM;10 reps;Both;Supine Heel Slides: AAROM;Supine;Right;15 reps Hip ABduction/ADduction: AAROM;Right;Supine;15 reps   PT Diagnosis:    PT Problem List:   PT Treatment Interventions:     PT Goals Acute Rehab PT Goals PT Goal Formulation: With patient Time For Goal Achievement: 11/06/12 Potential to Achieve Goals: Fair Pt will go Supine/Side to Sit: with min assist PT Goal: Supine/Side to Sit - Progress: Progressing toward goal Pt will go Sit to Supine/Side: with min assist PT Goal: Sit to Supine/Side - Progress: Progressing toward goal Pt will go Sit to Stand: with min assist PT Goal: Sit to Stand - Progress: Progressing toward goal Pt will go Stand to Sit: with min assist PT Goal: Stand to Sit - Progress: Progressing toward goal Pt will Ambulate: 1 - 15 feet;with +2 total assist;with rolling walker PT Goal: Ambulate - Progress: Progressing toward goal  Visit Information  Last PT Received On: 11/01/12 Assistance Needed: +2    Subjective Data  Subjective: I feel better since I got blood yesterday Patient Stated Goal: Rehab and home   Cognition  Cognition Overall Cognitive Status: Appears within functional limits for tasks assessed/performed Arousal/Alertness: Awake/alert Orientation Level: Appears intact for tasks assessed Behavior During Session: [facility] for tasks performed    Balance     End of Session PT - End of Session Equipment Utilized During Treatment: Gait belt Activity Tolerance: Patient tolerated treatment well Patient left: in chair;with call bell/phone within reach Nurse Communication: Mobility status   GP     Kermit Arnette 11/01/2012, 12:16 PM

## 2012-11-01 NOTE — Plan of Care (Signed)
Problem: Consults Goal: Diagnosis- Total Joint Replacement Outcome: Completed/Met Date Met:  11/01/12 Revision Total Hip RIGHT

## 2012-11-01 NOTE — Consult Note (Signed)
Physical Medicine and Rehabilitation Consult Reason for Consult: Revision of right hip fracture Referring Physician:  Dr. Charlann Boxer   HPI: Tracey Nguyen is a 72 y.o. 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 Charlann Boxer on 10/30/12. Post op with ABLA with hgb 7.4 and was transfused with 2 units PRBC. 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 Therapies initiated and patient limited by TDWB and pain. Therapy team recommending CIR.   Review of Systems  HENT: Positive for neck pain.   Eyes: Negative for blurred vision and double vision.  Respiratory: Negative for cough and shortness of breath.   Cardiovascular: Negative for chest pain and palpitations.  Gastrointestinal: Negative for nausea and constipation.  Genitourinary: Negative for urgency and frequency.  Musculoskeletal: Positive for back pain.  Neurological: Negative for headaches.   Past Medical History  Diagnosis Date  . Asthma   . Arthritis   . Cancer 2007    breast cancer  . GERD (gastroesophageal reflux disease)   . Hypertension   . Osteoporosis   . Sleep apnea     on cpap  . Blood transfusion   . Cataract     BILATERAL -REMOVED VIA SURGERY  . History of D&C   . Pneumonia     hx of  . Headache     hx of migraines  . Complication of anesthesia     hard to wake up in 1970   Past Surgical History  Procedure Laterality Date  . Femur fracture surgery  2011    titanium rod/right  . Mastectomy  2007    right  . Hemorrhoidectomy  1987  . Breast biopsy  1995    left  . Cataract extraction, bilateral  2006  . Dilation and curettage of uterus    . Femur im nail Right 10/30/2012    Procedure: revision right femur INTRAMEDULLARY NAILING;  Surgeon: [name], MD;  Location: [facility]  [facility];  Service: Orthopedics;  Laterality: Right;   History reviewed. No pertinent family history.  Social History:  Married. Was independent with walker PTA.  Per reports that she has never smoked. She has never used smokeless tobacco. She reports that she does not drink alcohol or use illicit drugs.  Allergies  Allergen Reactions  . Codeine     REACTION: spacey   Medications Prior to Admission  Medication Sig Dispense Refill  . albuterol-ipratropium (COMBIVENT) 18-103 MCG/ACT inhaler Inhale 2 puffs into the lungs every 6 (six) hours as needed for wheezing or shortness of breath.      Marland Kitchen amoxicillin-clavulanate (AUGMENTIN) 875-125 MG per tablet Take 1 tablet by mouth 2 (two) times daily.  20 tablet  0  . budesonide-formoterol (SYMBICORT) 160-4.5 MCG/ACT inhaler Inhale 2 puffs into the lungs 2 (two) times daily.      . DULoxetine (CYMBALTA) 30 MG capsule Take 1 capsule (30 mg total) by mouth daily.  30 capsule  5  . furosemide (LASIX) 40 MG tablet Take 40 mg by mouth daily before breakfast.      . losartan (COZAAR) 100 MG tablet Take 100 mg by mouth daily before breakfast.      . nebivolol (BYSTOLIC) 5 MG tablet Take 5 mg by mouth daily after breakfast.      .  omeprazole (PRILOSEC) 20 MG capsule Take 20 mg by mouth daily before breakfast.      . pramipexole (MIRAPEX) 1 MG tablet Take 1 mg by mouth every evening.       . traMADol (ULTRAM) 50 MG tablet Take 50 mg by mouth every 6 (six) hours as needed for pain.      . Calcium Carbonate (CALTRATE 600 PO) Take 1 tablet by mouth daily.       . fluticasone (FLONASE) 50 MCG/ACT nasal spray Place 1 spray into the nose daily.  16 g  3  . Multiple Vitamin (MULTIVITAMIN) tablet Take 1 tablet by mouth every other day.         Home: Home Living Lives With: Spouse Available Help at Discharge: Family Type of Home: House Home Access: Ramped entrance Home Layout: One level Bathroom Shower/Tub: Tub/shower unit;Walk-in shower Bathroom Toilet:  Standard Bathroom Accessibility: Yes How Accessible: Accessible via walker Home Adaptive Equipment: Bedside commode/3-in-1;Grab bars in shower;Tub transfer bench;Walker - rolling;Reacher  Functional History: Prior Function Bath: Minimal Functional Status:  Mobility: Bed Mobility Bed Mobility: Supine to Sit Supine to Sit: 3: Mod assist Supine to Sit: Patient Percentage: 50% Sitting - Scoot to Edge of Bed: 3: Mod assist;4: Min assist Sit to Supine: 1: +2 Total assist;HOB flat;Other (comment) (Upper and lower body assist) Sit to Supine: Patient Percentage: 0% Transfers Transfers: Sit to Stand;Stand to Sit Sit to Stand: 1: +2 Total assist;From elevated surface;With upper extremity assist;From bed;From chair/3-in-1 Sit to Stand: Patient Percentage: 60% Stand to Sit: 1: +2 Total assist;With upper extremity assist;To chair/3-in-1;With armrests Stand to Sit: Patient Percentage: 70% Ambulation/Gait Ambulation/Gait Assistance: 1: +2 Total assist Ambulation/Gait: Patient Percentage: 70% Ambulation Distance (Feet): 9 Feet (and 3) Assistive device: Rolling walker Ambulation/Gait Assistance Details: cues for posture, sequence, stride length and TWB Gait Pattern: Step-to pattern;Decreased step length - right;Decreased step length - left;Decreased stance time - right General Gait Details: Pt doing remarkably well complying with TWB Stairs: No    ADL: ADL Eating/Feeding: Performed;Independent Where Assessed - Eating/Feeding: Bed level Grooming: Performed;Wash/dry hands;Modified independent;Set up Where Assessed - Grooming: Supine, head of bed up Upper Body Bathing: Simulated;Minimal assistance Where Assessed - Upper Body Bathing: Supine, head of bed up Lower Body Bathing: Simulated;+2 Total assistance Where Assessed - Lower Body Bathing: Supported sit to stand Upper Body Dressing: Performed;Set up Where Assessed - Upper Body Dressing: Unsupported sitting Lower Body Dressing: Performed;+2  Total assistance Where Assessed - Lower Body Dressing: Supported sit to stand Toilet Transfer: Simulated;+2 Total assistance Toilet Transfer Method: Surveyor, minerals: Gaffer Method: Not assessed Equipment Used: Gait belt;Rolling walker;Sock aid;Reacher Transfers/Ambulation Related to ADLs: Pt sit to stand x2 w/ +2 assist from EOB in preparation for transfers. Pt fatigues easily & currently receiving blood. Cont to assess. ADL Comments: Pt was able to sit at EOB & perform ADL treatment session for LB dressing w/ A/E & Mod A for doff/don socks. Pt is overall +2 assist for sit-stand activities at this time secondary to R LE TDWB & fatigue/lethergy (receiving blood today). She will benefit from acute OT followed by in pt rehab vs SNF.  Cognition: Cognition Arousal/Alertness: Awake/alert Orientation Level: Oriented X4 Cognition Overall Cognitive Status: Appears within functional limits for tasks assessed/performed Arousal/Alertness: Awake/alert Orientation Level: Appears intact for tasks assessed Behavior During Session: [facility] for tasks performed  Blood pressure 106/75, pulse 59, temperature 98 F (36.7 C), resp. rate 16, height 4\' 10"  (1.473 m), weight 72.576 kg (  160 lb), SpO2 92.00%. Physical Exam  Nursing note and vitals reviewed. Constitutional: She is oriented to person, place, and time. She appears well-developed and well-nourished.  HENT:  Head: Normocephalic and atraumatic.  Eyes: Pupils are equal, round, and reactive to light.  Neck: Normal range of motion. Neck supple.  Cardiovascular: Normal rate and regular rhythm.   Pulmonary/Chest: Effort normal and breath sounds normal.  Abdominal: Soft. Bowel sounds are normal.  Musculoskeletal: She exhibits edema.  RLE limited due to pain. Muscle wasting RUE.   Neurological: She is alert and oriented to person, place, and time.  Skin: Skin is warm and dry.    Results for orders placed  during the hospital encounter of 10/30/12 (from the past 24 hour(s))  CBC     Status: Abnormal   Collection Time    11/01/12  4:30 AM      Result Value Range   WBC 8.3  4.0 - 10.5 K/uL   RBC 4.56  3.87 - 5.11 MIL/uL   Hemoglobin 10.1 (*) 12.0 - 15.0 g/dL   HCT 16.1 (*) 09.6 - 04.5 %   MCV 68.2 (*) 78.0 - 100.0 fL   MCH 22.1 (*) 26.0 - 34.0 pg   MCHC 32.5  30.0 - 36.0 g/dL   RDW 40.9 (*) 81.1 - 91.4 %   Platelets 133 (*) 150 - 400 K/uL  BASIC METABOLIC PANEL     Status: Abnormal   Collection Time    11/01/12  4:30 AM      Result Value Range   Sodium 137  135 - 145 mEq/L   Potassium 4.2  3.5 - 5.1 mEq/L   Chloride 104  96 - 112 mEq/L   CO2 27  19 - 32 mEq/L   Glucose, Bld 148 (*) 70 - 99 mg/dL   BUN 21  6 - 23 mg/dL   Creatinine, Ser 7.82 (*) 0.50 - 1.10 mg/dL   Calcium 8.5  8.4 - 95.6 mg/dL   GFR calc non Af Amer 39 (*) >90 mL/min   GFR calc Af Amer 45 (*) >90 mL/min   Dg Femur Right  10/30/2012  *RADIOLOGY REPORT*  Clinical Data: Removal and update of orthopedic hardware  RIGHT FEMUR - 2 VIEW  Comparison: 03/09/2010  Findings: Images show placement of a new intramedullary rod and compression screw extending across the femoral neck into the femoral head.  The intramedullary rod extends to the distal femoral metaphysis, held in place by transverse fixation screw.  The orthopedic hardware appears well seated and there is no evidence of an operative complication.   Original Report Authenticated By: Amie Portland, M.D.    Dg C-arm 61-120 Min-no Report  10/30/2012  CLINICAL DATA: Surgery   C-ARM 61-120 MINUTES  Fluoroscopy was utilized by the requesting physician.  No radiographic  interpretation.      Assessment/Plan: Diagnosis: Revision of ORIF right femur fx 1. Does the need for close, 24 hr/day medical supervision in concert with the patient's rehab needs make it unreasonable for this patient to be served in a less intensive setting? Potentially 2. Co-Morbidities requiring  supervision/potential complications: depression, obesity, htn, peripheral neuropathy, OSA, abla 3. Due to bladder management, bowel management, safety, skin/wound care, disease management, medication administration, pain management and patient education, does the patient require 24 hr/day rehab nursing? Yes 4. Does the patient require coordinated care of a physician, rehab nurse, PT (1-2 hrs/day, 5 days/week) and OT (1-2 hrs/day, 5 days/week) to address physical and functional deficits in the  context of the above medical diagnosis(es)? Yes Addressing deficits in the following areas: balance, endurance, locomotion, strength, transferring, bowel/bladder control, bathing, dressing, feeding, grooming, toileting and psychosocial support 5. Can the patient actively participate in an intensive therapy program of at least 3 hrs of therapy per day at least 5 days per week? Yes 6. The potential for patient to make measurable gains while on inpatient rehab is excellent 7. Anticipated functional outcomes upon discharge from inpatient rehab are mod I to supervision with PT, mod I to min assist with OT, n/a with SLP. 8. Estimated rehab length of stay to reach the above functional goals is: 8-12 days 9. Does the patient have adequate social supports to accommodate these discharge functional goals? Yes 10. Anticipated D/C setting: Home 11. Anticipated post D/C treatments: HH therapy 12. Overall Rehab/Functional Prognosis: excellent  RECOMMENDATIONS: This patient's condition is appropriate for continued rehabilitative care in the following setting: CIR Patient has agreed to participate in recommended program. Potentially Note that insurance prior authorization may be required for reimbursement for recommended care.  Comment:Rehab RN to follow up regarding bed availability.   Ranelle Oyster, MD, [place]     11/01/2012

## 2012-11-01 NOTE — Progress Notes (Signed)
CSW met with pt this afternoon to assist with d/c planning. Pt has chosen [facility] for rehab if CIR is unable to assist. SNF bed is available Thurs , if needed.  Cori Razor LCSW [phone]

## 2012-11-02 ENCOUNTER — Inpatient Hospital Stay (HOSPITAL_COMMUNITY)
Admission: RE | Admit: 2012-11-02 | Discharge: 2012-11-16 | DRG: 945 | Disposition: A | Discharge status: Home Health | Payer: Medicare Other | Source: Intra-hospital | Attending: Physical Medicine & Rehabilitation | Admitting: Physical Medicine & Rehabilitation

## 2012-11-02 ENCOUNTER — Encounter (HOSPITAL_COMMUNITY): Payer: Self-pay | Admitting: *Deleted

## 2012-11-02 DIAGNOSIS — D62 Acute posthemorrhagic anemia: Secondary | ICD-10-CM

## 2012-11-02 DIAGNOSIS — R3989 Other symptoms and signs involving the genitourinary system: Secondary | ICD-10-CM

## 2012-11-02 DIAGNOSIS — N189 Chronic kidney disease, unspecified: Secondary | ICD-10-CM | POA: Diagnosis not present

## 2012-11-02 DIAGNOSIS — T84498A Other mechanical complication of other internal orthopedic devices, implants and grafts, initial encounter: Secondary | ICD-10-CM

## 2012-11-02 DIAGNOSIS — G4733 Obstructive sleep apnea (adult) (pediatric): Secondary | ICD-10-CM

## 2012-11-02 DIAGNOSIS — Z5189 Encounter for other specified aftercare: Principal | ICD-10-CM

## 2012-11-02 DIAGNOSIS — J45909 Unspecified asthma, uncomplicated: Secondary | ICD-10-CM

## 2012-11-02 DIAGNOSIS — Y92009 Unspecified place in unspecified non-institutional (private) residence as the place of occurrence of the external cause: Secondary | ICD-10-CM

## 2012-11-02 DIAGNOSIS — Z79899 Other long term (current) drug therapy: Secondary | ICD-10-CM

## 2012-11-02 DIAGNOSIS — I10 Essential (primary) hypertension: Secondary | ICD-10-CM

## 2012-11-02 DIAGNOSIS — M810 Age-related osteoporosis without current pathological fracture: Secondary | ICD-10-CM

## 2012-11-02 DIAGNOSIS — W19XXXA Unspecified fall, initial encounter: Secondary | ICD-10-CM

## 2012-11-02 DIAGNOSIS — Y831 Surgical operation with implant of artificial internal device as the cause of abnormal reaction of the patient, or of later complication, without mention of misadventure at the time of the procedure: Secondary | ICD-10-CM

## 2012-11-02 DIAGNOSIS — J0120 Acute ethmoidal sinusitis, unspecified: Secondary | ICD-10-CM

## 2012-11-02 DIAGNOSIS — Y9210 Unspecified residential institution as the place of occurrence of the external cause: Secondary | ICD-10-CM

## 2012-11-02 DIAGNOSIS — S72009A Fracture of unspecified part of neck of unspecified femur, initial encounter for closed fracture: Secondary | ICD-10-CM

## 2012-11-02 DIAGNOSIS — Z6835 Body mass index (BMI) 35.0-35.9, adult: Secondary | ICD-10-CM

## 2012-11-02 DIAGNOSIS — J322 Chronic ethmoidal sinusitis: Secondary | ICD-10-CM | POA: Diagnosis present

## 2012-11-02 DIAGNOSIS — Z9010 Acquired absence of unspecified breast and nipple: Secondary | ICD-10-CM

## 2012-11-02 DIAGNOSIS — Z853 Personal history of malignant neoplasm of breast: Secondary | ICD-10-CM

## 2012-11-02 DIAGNOSIS — IMO0002 Reserved for concepts with insufficient information to code with codable children: Secondary | ICD-10-CM

## 2012-11-02 DIAGNOSIS — K219 Gastro-esophageal reflux disease without esophagitis: Secondary | ICD-10-CM

## 2012-11-02 DIAGNOSIS — E669 Obesity, unspecified: Secondary | ICD-10-CM

## 2012-11-02 MED ORDER — ASPIRIN EC 325 MG PO TBEC: 325.0000 mg | DELAYED_RELEASE_TABLET | Freq: Two times a day (BID) | ORAL | Status: DC

## 2012-11-02 MED ORDER — BIOTENE DRY MOUTH MT LIQD
15.0000 mL | Freq: Two times a day (BID) | OROMUCOSAL | Status: DC
  Administered 2012-11-03 – 2012-11-16 (×21): 15 mL via OROMUCOSAL

## 2012-11-02 MED ORDER — DULOXETINE HCL 30 MG PO CPEP
30.0000 mg | ORAL_CAPSULE | Freq: Every day | ORAL | Status: DC
  Administered 2012-11-03 – 2012-11-16 (×14): 30 mg via ORAL
  Filled 2012-11-02 (×15): qty 1

## 2012-11-02 MED ORDER — METHOCARBAMOL 500 MG PO TABS: 500.0000 mg | ORAL_TABLET | Freq: Four times a day (QID) | ORAL | Status: DC | PRN

## 2012-11-02 MED ORDER — HYDROCODONE-ACETAMINOPHEN 10-325 MG PO TABS
1.0000 | ORAL_TABLET | ORAL | Status: DC | PRN
Start: 2012-11-02 — End: 2012-11-07
  Administered 2012-11-05 – 2012-11-07 (×3): 1 via ORAL
  Filled 2012-11-02 (×3): qty 1

## 2012-11-02 MED ORDER — LOSARTAN POTASSIUM 50 MG PO TABS
100.0000 mg | ORAL_TABLET | Freq: Every day | ORAL | Status: DC
  Administered 2012-11-03 – 2012-11-06 (×4): 100 mg via ORAL
  Filled 2012-11-02 (×6): qty 2

## 2012-11-02 MED ORDER — ONDANSETRON HCL 4 MG/2ML IJ SOLN: 4.0000 mg | Freq: Four times a day (QID) | INTRAMUSCULAR | Status: DC | PRN

## 2012-11-02 MED ORDER — FERROUS SULFATE 325 (65 FE) MG PO TABS
325.0000 mg | ORAL_TABLET | Freq: Three times a day (TID) | ORAL | Status: DC
  Filled 2012-11-02 (×2): qty 1

## 2012-11-02 MED ORDER — AMOXICILLIN-POT CLAVULANATE 875-125 MG PO TABS
1.0000 | ORAL_TABLET | Freq: Two times a day (BID) | ORAL | Status: DC
  Filled 2012-11-02 (×2): qty 1

## 2012-11-02 MED ORDER — IPRATROPIUM-ALBUTEROL 20-100 MCG/ACT IN AERS
1.0000 | INHALATION_SPRAY | Freq: Four times a day (QID) | RESPIRATORY_TRACT | Status: DC | PRN
  Administered 2012-11-07 – 2012-11-08 (×3): 1 via RESPIRATORY_TRACT
  Filled 2012-11-02 (×2): qty 4

## 2012-11-02 MED ORDER — RIVAROXABAN 10 MG PO TABS
10.0000 mg | ORAL_TABLET | ORAL | Status: DC
  Administered 2012-11-03 – 2012-11-07 (×5): 10 mg via ORAL
  Filled 2012-11-02 (×6): qty 1

## 2012-11-02 MED ORDER — FUROSEMIDE 40 MG PO TABS
40.0000 mg | ORAL_TABLET | Freq: Every day | ORAL | Status: DC
  Administered 2012-11-03 – 2012-11-06 (×4): 40 mg via ORAL
  Filled 2012-11-02 (×6): qty 1

## 2012-11-02 MED ORDER — PRAMIPEXOLE DIHYDROCHLORIDE 1 MG PO TABS
1.0000 mg | ORAL_TABLET | Freq: Every evening | ORAL | Status: DC
  Administered 2012-11-02 – 2012-11-15 (×14): 1 mg via ORAL
  Filled 2012-11-02 (×16): qty 1

## 2012-11-02 MED ORDER — FERROUS SULFATE 325 (65 FE) MG PO TABS: 325.0000 mg | ORAL_TABLET | Freq: Three times a day (TID) | ORAL | Status: DC

## 2012-11-02 MED ORDER — DOCUSATE SODIUM 100 MG PO CAPS
100.0000 mg | ORAL_CAPSULE | Freq: Two times a day (BID) | ORAL | Status: DC
  Administered 2012-11-02: 100 mg via ORAL
  Filled 2012-11-02 (×4): qty 1

## 2012-11-02 MED ORDER — MENTHOL 3 MG MT LOZG: 1.0000 | LOZENGE | OROMUCOSAL | Status: DC | PRN

## 2012-11-02 MED ORDER — CHLORHEXIDINE GLUCONATE 0.12 % MT SOLN
15.0000 mL | Freq: Two times a day (BID) | OROMUCOSAL | Status: DC
  Administered 2012-11-02 – 2012-11-16 (×28): 15 mL via OROMUCOSAL
  Filled 2012-11-02 (×30): qty 15

## 2012-11-02 MED ORDER — METHOCARBAMOL 500 MG PO TABS
500.0000 mg | ORAL_TABLET | Freq: Four times a day (QID) | ORAL | Status: DC | PRN
  Filled 2012-11-02: qty 1

## 2012-11-02 MED ORDER — AMOXICILLIN-POT CLAVULANATE 875-125 MG PO TABS
1.0000 | ORAL_TABLET | Freq: Two times a day (BID) | ORAL | Status: AC
  Administered 2012-11-02 – 2012-11-03 (×3): 1 via ORAL
  Filled 2012-11-02 (×3): qty 1

## 2012-11-02 MED ORDER — ACETAMINOPHEN 325 MG PO TABS
325.0000 mg | ORAL_TABLET | ORAL | Status: DC | PRN
  Administered 2012-11-05: 650 mg via ORAL
  Filled 2012-11-02: qty 2

## 2012-11-02 MED ORDER — FLEET ENEMA 7-19 GM/118ML RE ENEM: 1.0000 | ENEMA | Freq: Once | RECTAL | Status: AC | PRN

## 2012-11-02 MED ORDER — MAGIC MOUTHWASH
15.0000 mL | Freq: Three times a day (TID) | ORAL | Status: DC
  Administered 2012-11-02 – 2012-11-16 (×40): 15 mL via ORAL
  Filled 2012-11-02 (×46): qty 15

## 2012-11-02 MED ORDER — TRAMADOL HCL 50 MG PO TABS
50.0000 mg | ORAL_TABLET | Freq: Four times a day (QID) | ORAL | Status: DC | PRN
  Administered 2012-11-03 – 2012-11-05 (×6): 50 mg via ORAL
  Administered 2012-11-06: 100 mg via ORAL
  Administered 2012-11-06: 50 mg via ORAL
  Filled 2012-11-02: qty 2
  Filled 2012-11-02 (×3): qty 1
  Filled 2012-11-02: qty 2
  Filled 2012-11-02 (×3): qty 1

## 2012-11-02 MED ORDER — PHENOL 1.4 % MT LIQD: 1.0000 | OROMUCOSAL | Status: DC | PRN

## 2012-11-02 MED ORDER — POLYETHYLENE GLYCOL 3350 17 G PO PACK
17.0000 g | PACK | Freq: Two times a day (BID) | ORAL | Status: DC
  Administered 2012-11-02: 17 g via ORAL
  Filled 2012-11-02 (×4): qty 1

## 2012-11-02 MED ORDER — IPRATROPIUM-ALBUTEROL 18-103 MCG/ACT IN AERO
2.0000 | INHALATION_SPRAY | Freq: Four times a day (QID) | RESPIRATORY_TRACT | Status: DC | PRN
  Filled 2012-11-02: qty 14.7

## 2012-11-02 MED ORDER — ONDANSETRON HCL 4 MG PO TABS: 4.0000 mg | ORAL_TABLET | Freq: Four times a day (QID) | ORAL | Status: DC | PRN

## 2012-11-02 MED ORDER — ALUM & MAG HYDROXIDE-SIMETH 200-200-20 MG/5ML PO SUSP: 30.0000 mL | ORAL | Status: DC | PRN

## 2012-11-02 MED ORDER — BUDESONIDE-FORMOTEROL FUMARATE 160-4.5 MCG/ACT IN AERO
2.0000 | INHALATION_SPRAY | Freq: Two times a day (BID) | RESPIRATORY_TRACT | Status: DC
  Administered 2012-11-02 – 2012-11-16 (×26): 2 via RESPIRATORY_TRACT
  Filled 2012-11-02: qty 6

## 2012-11-02 MED ORDER — GUAIFENESIN-DM 100-10 MG/5ML PO SYRP
5.0000 mL | ORAL_SOLUTION | Freq: Four times a day (QID) | ORAL | Status: DC | PRN
  Administered 2012-11-08: 10 mL via ORAL
  Filled 2012-11-02: qty 10

## 2012-11-02 MED ORDER — DSS 100 MG PO CAPS: 100.0000 mg | ORAL_CAPSULE | Freq: Two times a day (BID) | ORAL | Status: DC

## 2012-11-02 MED ORDER — POLYETHYLENE GLYCOL 3350 17 G PO PACK: 17.0000 g | PACK | Freq: Two times a day (BID) | ORAL | Status: DC

## 2012-11-02 MED ORDER — NEBIVOLOL HCL 5 MG PO TABS
5.0000 mg | ORAL_TABLET | Freq: Every day | ORAL | Status: DC
  Administered 2012-11-03 – 2012-11-16 (×14): 5 mg via ORAL
  Filled 2012-11-02 (×15): qty 1

## 2012-11-02 MED ORDER — BISACODYL 10 MG RE SUPP
10.0000 mg | Freq: Every day | RECTAL | Status: DC | PRN
  Administered 2012-11-08: 10 mg via RECTAL
  Filled 2012-11-02: qty 1

## 2012-11-02 MED ORDER — PANTOPRAZOLE SODIUM 40 MG PO TBEC
40.0000 mg | DELAYED_RELEASE_TABLET | Freq: Every day | ORAL | Status: DC
  Administered 2012-11-03 – 2012-11-16 (×14): 40 mg via ORAL
  Filled 2012-11-02 (×14): qty 1

## 2012-11-02 MED ORDER — CELECOXIB 200 MG PO CAPS
200.0000 mg | ORAL_CAPSULE | Freq: Every day | ORAL | Status: DC
  Administered 2012-11-03: 200 mg via ORAL
  Filled 2012-11-02 (×2): qty 1

## 2012-11-02 MED ORDER — DIPHENHYDRAMINE HCL 25 MG PO CAPS
25.0000 mg | ORAL_CAPSULE | Freq: Four times a day (QID) | ORAL | Status: DC | PRN
  Filled 2012-11-02: qty 1

## 2012-11-02 MED ORDER — TRAZODONE HCL 50 MG PO TABS: 25.0000 mg | ORAL_TABLET | Freq: Every evening | ORAL | Status: DC | PRN

## 2012-11-02 MED ORDER — FLUTICASONE PROPIONATE 50 MCG/ACT NA SUSP
1.0000 | Freq: Every day | NASAL | Status: DC
  Administered 2012-11-03 – 2012-11-16 (×12): 1 via NASAL
  Filled 2012-11-02: qty 16

## 2012-11-02 NOTE — Progress Notes (Signed)
Patient admitted from [facility] and arrived via [facility].  Alert and oriented x4.  Vitals stable.  Patient and husband oriented to the room and unit.  Denies pain.  Small laceration to left shin, bruise to left hand.  Bed alarm activated.  Will continue to monitor.

## 2012-11-02 NOTE — Discharge Summary (Signed)
Physician Discharge Summary  Patient ID: HAFSAH HENDLER MRN: [id number] DOB/AGE: 04/01/1941 72 y.o.  Admit date: 10/30/2012 Discharge date:  11/02/2012  Procedures:  Procedure(s) (LRB): revision right femur INTRAMEDULLARY NAILING (Right)  Attending Physician:  Dr. Durene Romans   Admission Diagnoses:   Right hip pain / IM nail failure  Discharge Diagnoses:  Principal Problem:   S/P removal of and implantation of new IM nail, right hip Active Problems:   Acute blood loss anemia   Obesity (BMI 30.0-34.9)  Past Medical History  Diagnosis Date  . Asthma   . Arthritis   . Cancer 2007    breast cancer  . GERD (gastroesophageal reflux disease)   . Hypertension   . Osteoporosis   . Sleep apnea     on cpap  . Blood transfusion   . Cataract     BILATERAL -REMOVED VIA SURGERY  . History of D&C   . Pneumonia     hx of  . Headache     hx of migraines  . Complication of anesthesia     hard to wake up in 1970    HPI: Pt is a 72 y.o. female complaining of right hip pain for a couple of months. Pain had continually increased since that time and now has a lot of difficulty bearing weight on the right leg. X-rays in the clinic show previous IM nailing of the right hip with failure of the nail . Pt has tried various conservative treatments which have failed to alleviate their symptoms, including NSAIDs, analgesic medications and modifications. Various options are discussed with the patient. Risks, benefits and expectations were discussed with the patient. Patient understand the risks, benefits and expectations and wishes to proceed with surgery.   PCP: Evette Georges, MD   Discharged Condition: good  Hospital Course:  Patient underwent the above stated procedure on 10/30/2012. Patient tolerated the procedure well and brought to the recovery room in good condition and subsequently to the floor.  POD #1 BP: 99/64 ; Pulse: 85 ; Temp: 99.6 F (37.6 C) ; Resp: 16 Pt's foley  was removed, as well as the hemovac drain removed. IV was changed to a saline lock. Patient reports pain as mild, pain well controlled. She states that hip pain is much better than it was prior to surgery. She has had very low urinary output, BP had been a little low and H&H low. Otherwise no events throughout the night. She feels that SNF might be better for her to get some extra assistance to help her until she is strong enough to be more independent.  She received 2 units of blood. Neurovascular intact, dorsiflexion/plantar flexion intact, incision: dressing C/D/I, no cellulitis present and compartment soft.   LABS  Basename  10/31/12    0505   HGB  7.4  HCT  23.6   POD #2  BP: 106/75 ; Pulse: 59 ; Temp: 98 F (36.7 C) ; Resp: 16  Patient reports pain as mild, pain well controlled. No events throughout the night. Would like to be considered for inpatient rehab. Feeling better after the 2 units of blood. Neurovascular intact, dorsiflexion/plantar flexion intact, incision: dressing C/D/I, no cellulitis present and compartment soft.   LABS  Basename  11/01/12    0430   HGB  10.1  HCT  31.1   POD #3  BP: 115/78 ; Pulse: 85 ; Temp: 98.1 F (36.7 C)   Resp: 18 ;  Patient reports pain as mild, pain well controlled.  No events throughout the night. Ready to be discharged to rehab. Neurovascular intact, dorsiflexion/plantar flexion intact, incision: dressing C/D/I, no cellulitis present and compartment soft.   LABS   No new labs  Discharge Exam: General appearance: alert, cooperative and no distress Extremities: Homans sign is negative, no sign of DVT, no edema, redness or tenderness in the calves or thighs and no ulcers, gangrene or trophic changes  Disposition:  [facility]  with follow up in 2 weeks   Follow-up Information   Follow up with [name], MD. Schedule an appointment as soon as possible for a visit in 2 weeks.   Contact information:   [address] [place]  [address] [place] [address] [phone]       Discharge Orders   Future Appointments Provider Department Dept Phone   12/05/2012 11:30 AM Storm Frisk, MD [facility] [phone]   Future Orders Complete By Expires     Call MD / Call 911  As directed     Comments:      If you experience chest pain or shortness of breath, CALL 911 and be transported to the hospital emergency room.  If you develope a fever above 101 F, pus (white drainage) or increased drainage or redness at the wound, or calf pain, call your surgeon's office.    Change dressing  As directed     Comments:      Daily dressing changes with  with 4x4 guaze and tape. Keep the area dry and clean.    Constipation Prevention  As directed     Comments:      Drink plenty of fluids.  Prune juice may be helpful.  You may use a stool softener, such as Colace (over the counter) 100 mg twice a day.  Use MiraLax (over the counter) for constipation as needed.    Diet - low sodium heart healthy  As directed     Discharge instructions  As directed     Comments:      Daily dressing changes with gauze and tape. Keep the area dry and clean until follow up. Follow up in 2 weeks at [facility]. Call with any questions or concerns.    Driving restrictions  As directed     Comments:      No driving for 4 weeks    TED hose  As directed     Comments:      Use stockings (TED hose) for 2 weeks on both leg(s).  You may remove them at night for sleeping.    Touch down weight bearing  As directed          Medication List    TAKE these medications       albuterol-ipratropium 18-103 MCG/ACT inhaler  Commonly known as:  COMBIVENT  Inhale 2 puffs into the lungs every 6 (six) hours as needed for wheezing or shortness of breath.     amoxicillin-clavulanate 875-125 MG per tablet  Commonly known as:  AUGMENTIN  Take 1 tablet by mouth 2 (two) times daily.     aspirin EC 325 MG tablet  Take 1 tablet (325 mg total) by  mouth 2 (two) times daily.     budesonide-formoterol 160-4.5 MCG/ACT inhaler  Commonly known as:  SYMBICORT  Inhale 2 puffs into the lungs 2 (two) times daily.     CALTRATE 600 PO  Take 1 tablet by mouth daily.     DSS 100 MG Caps  Take 100 mg by  mouth 2 (two) times daily.     DULoxetine 30 MG capsule  Commonly known as:  CYMBALTA  Take 1 capsule (30 mg total) by mouth daily.     ferrous sulfate 325 (65 FE) MG tablet  Take 1 tablet (325 mg total) by mouth 3 (three) times daily after meals.     fluticasone 50 MCG/ACT nasal spray  Commonly known as:  FLONASE  Place 1 spray into the nose daily.     furosemide 40 MG tablet  Commonly known as:  LASIX  Take 40 mg by mouth daily before breakfast.     losartan 100 MG tablet  Commonly known as:  COZAAR  Take 100 mg by mouth daily before breakfast.     multivitamin tablet  Take 1 tablet by mouth every other day.     nebivolol 5 MG tablet  Commonly known as:  BYSTOLIC  Take 5 mg by mouth daily after breakfast.     omeprazole 20 MG capsule  Commonly known as:  PRILOSEC  Take 20 mg by mouth daily before breakfast.     polyethylene glycol packet  Commonly known as:  MIRALAX / GLYCOLAX  Take 17 g by mouth 2 (two) times daily.     pramipexole 1 MG tablet  Commonly known as:  MIRAPEX  Take 1 mg by mouth every evening.     traMADol 50 MG tablet  Commonly known as:  ULTRAM  Take 50 mg by mouth every 6 (six) hours as needed for pain.         Signed: Anastasio Auerbach. Jaclyn Andy   PAC  11/02/2012, 1:58 PM

## 2012-11-02 NOTE — Progress Notes (Signed)
Placed pt. On cpap. Pt. Has her own mask and tubing. Pt. Placed on auto titrate max 18  min 5. And pt. States the pressure is comfortable. Pt. Also has 2L of oxygen titrated into the cpap.

## 2012-11-02 NOTE — H&P (Signed)
Physical Medicine and Rehabilitation Admission H&P  CC: Failure of hardware past Right femur ORIF  HPI: Tracey Nguyen is a 72 y.o. 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 Charlann Boxer on 10/30/12. Post op with ABLA with hgb 7.4 and was transfused with 2 units PRBC. 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 Therapies initiated and patient limited by TDWB and pain. Therapy team recommended CIR and patient admitted today for further therapies.    Review of Systems  HENT: Negative for hearing loss.  Eyes: Negative for blurred vision and double vision.  Respiratory: Negative for cough and shortness of breath.  Cardiovascular: Negative for chest pain and palpitations.  Gastrointestinal: Negative for heartburn, vomiting and constipation.  Genitourinary: Negative for urgency and frequency.  Musculoskeletal: Positive for myalgias (h/o right shoulder pain) and joint pain (right hip pain).  Neurological: Negative for dizziness, tingling and headaches.  Psychiatric/Behavioral: The patient does not have insomnia.   Past Medical History   Diagnosis  Date   .  Asthma    .  Arthritis    .  Cancer  2007     breast cancer   .  GERD (gastroesophageal reflux disease)    .  Hypertension    .  Osteoporosis    .  Sleep apnea      on cpap   .  Blood transfusion    .  Cataract      BILATERAL -REMOVED VIA SURGERY   .  History of D&C    .  Pneumonia      hx of   .  Headache      hx of migraines   .  Complication of anesthesia      hard to wake up in 1970    Past Surgical History   Procedure  Laterality  Date   .  Femur fracture surgery   2011     titanium rod/right   .  Mastectomy   2007     right   .  Hemorrhoidectomy   1987   .  Breast biopsy   1995      left   .  Cataract extraction, bilateral   2006   .  Dilation and curettage of uterus     .  Femur im nail  Right  10/30/2012     Procedure: revision right femur INTRAMEDULLARY NAILING; Surgeon: [name], MD; Location: [facility]; Service: Orthopedics; Laterality: Right;    No family history on file.  Social History: Married. Was independent with walker PTA. Per reports that she has never smoked. She has never used smokeless tobacco. She reports that she does not drink alcohol or use illicit drugs  Allergies   Allergen  Reactions   .  Codeine      REACTION: spacey    Medications Prior to Admission   Medication  Sig  Dispense  Refill   .  albuterol-ipratropium (COMBIVENT) 18-103 MCG/ACT inhaler  Inhale 2 puffs into the lungs every 6 (six) hours as needed for wheezing or shortness of breath.     Marland Kitchen  amoxicillin-clavulanate (AUGMENTIN) 875-125 MG per tablet  Take 1 tablet by mouth 2 (two) times daily.  20 tablet  0   .  aspirin EC  325 MG tablet  Take 1 tablet (325 mg total) by mouth 2 (two) times daily.  60 tablet  0   .  budesonide-formoterol (SYMBICORT) 160-4.5 MCG/ACT inhaler  Inhale 2 puffs into the lungs 2 (two) times daily.     .  Calcium Carbonate (CALTRATE 600 PO)  Take 1 tablet by mouth daily.     Marland Kitchen  docusate sodium 100 MG CAPS  Take 100 mg by mouth 2 (two) times daily.  10 capsule    .  DULoxetine (CYMBALTA) 30 MG capsule  Take 1 capsule (30 mg total) by mouth daily.  30 capsule  5   .  ferrous sulfate 325 (65 FE) MG tablet  Take 1 tablet (325 mg total) by mouth 3 (three) times daily after meals.     .  fluticasone (FLONASE) 50 MCG/ACT nasal spray  Place 1 spray into the nose daily.  16 g  3   .  furosemide (LASIX) 40 MG tablet  Take 40 mg by mouth daily before breakfast.     .  losartan (COZAAR) 100 MG tablet  Take 100 mg by mouth daily before breakfast.     .  Multiple Vitamin (MULTIVITAMIN) tablet  Take 1 tablet by mouth every other day.     .  nebivolol (BYSTOLIC) 5 MG  tablet  Take 5 mg by mouth daily after breakfast.     .  omeprazole (PRILOSEC) 20 MG capsule  Take 20 mg by mouth daily before breakfast.     .  polyethylene glycol (MIRALAX / GLYCOLAX) packet  Take 17 g by mouth 2 (two) times daily.  14 each    .  pramipexole (MIRAPEX) 1 MG tablet  Take 1 mg by mouth every evening.     .  traMADol (ULTRAM) 50 MG tablet  Take 50 mg by mouth every 6 (six) hours as needed for pain.      Home:  Home Living  Lives With: Spouse  Available Help at Discharge: Family  Type of Home: House  Home Access: Ramped entrance  Home Layout: One level  Bathroom Shower/Tub: Tub/shower unit;Walk-in shower  Bathroom Toilet: Standard  Bathroom Accessibility: Yes  How Accessible: Accessible via walker  Home Adaptive Equipment: Bedside commode/3-in-1;Grab bars in shower;Tub transfer bench;Walker - rolling;Reacher  Functional History:  Prior Function  Bath: Minimal  Functional Status:  Mobility:  Bed Mobility  Bed Mobility: Supine to Sit  Supine to Sit: 3: Mod assist  Supine to Sit: Patient Percentage: 50%  Sitting - Scoot to Edge of Bed: 3: Mod assist;4: Min assist  Sit to Supine: 1: +2 Total assist;HOB flat;Other (comment) (Upper and lower body assist)  Sit to Supine: Patient Percentage: 0%  Transfers  Transfers: Sit to Stand;Stand to Sit  Sit to Stand: 1: +2 Total assist;From elevated surface;With upper extremity assist;From bed;From chair/3-in-1  Sit to Stand: Patient Percentage: 60%  Stand to Sit: 1: +2 Total assist;With upper extremity assist;To chair/3-in-1;With armrests  Stand to Sit: Patient Percentage: 70%  Ambulation/Gait  Ambulation/Gait Assistance: 1: +2 Total assist  Ambulation/Gait: Patient Percentage: 70%  Ambulation Distance (Feet): 9 Feet (and 3)  Assistive device: Rolling walker  Ambulation/Gait Assistance Details: cues for posture, sequence, stride length and TWB  Gait Pattern: Step-to pattern;Decreased step length - right;Decreased step length -  left;Decreased stance time - right  General Gait Details: Pt doing remarkably well complying with TWB  Stairs: No  ADL:  ADL  Eating/Feeding: Performed;Independent  Where Assessed - Eating/Feeding: Bed level  Grooming: Performed;Wash/dry hands;Modified independent;Set up  Where Assessed - Grooming: Supine, head of bed up  Upper Body Bathing: Simulated;Minimal assistance  Where Assessed - Upper Body Bathing: Supine, head of bed up  Lower Body Bathing: Simulated;+2 Total assistance  Where Assessed - Lower Body Bathing: Supported sit to stand  Upper Body Dressing: Performed;Set up  Where Assessed - Upper Body Dressing: Unsupported sitting  Lower Body Dressing: Performed;+2 Total assistance  Where Assessed - Lower Body Dressing: Supported sit to stand  Toilet Transfer: Simulated;+2 Total assistance  Toilet Transfer Method: Archivist: Chief Financial Officer Method: Not assessed  Equipment Used: Gait belt;Rolling walker;Sock aid;Reacher  Transfers/Ambulation Related to ADLs: Pt sit to stand x2 w/ +2 assist from EOB in preparation for transfers. Pt fatigues easily & currently receiving blood. Cont to assess.  ADL Comments: Pt was able to sit at EOB & perform ADL treatment session for LB dressing w/ A/E & Mod A for doff/don socks. Pt is overall +2 assist for sit-stand activities at this time secondary to R LE TDWB & fatigue/lethergy (receiving blood today). She will benefit from acute OT followed by in pt rehab vs SNF.  Cognition:  Cognition  Arousal/Alertness: Awake/alert  Orientation Level: Oriented X4  Cognition  Overall Cognitive Status: Appears within functional limits for tasks assessed/performed  Arousal/Alertness: Awake/alert  Orientation Level: Appears intact for tasks assessed  Behavior During Session: [facility] for tasks performed     Physical Exam:   Vitals reviewed.  Constitutional: She is oriented to person, place, and time. She  appears well-developed and well-nourished.  Morbidly obese  HENT:  Head: Normocephalic and atraumatic.  Eyes: Pupils are equal, round, and reactive to light.  Neck: Normal range of motion.  Cardiovascular: Normal rate and regular rhythm.  Pulmonary/Chest: Effort normal. Occasional rhonchi, upper airway sounds. Non labored Abdominal: Bowel sounds are normal. Non distended. Non tender Musculoskeletal: She exhibits edema (Right hip/thigh edema. Dressing on hip with minimal dried blood. ).  Neurological: She is alert and oriented to person, place, and time. Strength 4/5 UE. Lower ext 1+ RHF, 2RKE, 4 ankle. LLE is 3 to 4+/5. No sensory deficits, DTR's 1= Skin: Skin is warm and dry other than right hip wound.   Results for orders placed during the hospital encounter of 10/30/12 (from the past 48 hour(s))   CBC Status: Abnormal    Collection Time    11/01/12 4:30 AM   Result  Value  Range    WBC  8.3  4.0 - 10.5 K/uL    RBC  4.56  3.87 - 5.11 MIL/uL    Hemoglobin  10.1 (*)  12.0 - 15.0 g/dL    Comment:  DELTA CHECK NOTED     POST TRANSFUSION SPECIMEN    HCT  31.1 (*)  36.0 - 46.0 %    MCV  68.2 (*)  78.0 - 100.0 fL    MCH  22.1 (*)  26.0 - 34.0 pg    MCHC  32.5  30.0 - 36.0 g/dL    RDW  78.2 (*)  95.6 - 15.5 %    Platelets  133 (*)  150 - 400 K/uL   BASIC METABOLIC PANEL Status: Abnormal    Collection Time    11/01/12 4:30 AM   Result  Value  Range    Sodium  137  135 - 145 mEq/L    Potassium  4.2  3.5 - 5.1 mEq/L    Chloride  104  96 -  112 mEq/L    CO2  27  19 - 32 mEq/L    Glucose, Bld  148 (*)  70 - 99 mg/dL    BUN  21  6 - 23 mg/dL    Creatinine, Ser  1.30 (*)  0.50 - 1.10 mg/dL    Calcium  8.5  8.4 - 10.5 mg/dL    GFR calc non Af Amer  39 (*)  >90 mL/min    GFR calc Af Amer  45 (*)  >90 mL/min    Comment:      The eGFR has been calculated     using the CKD EPI equation.     This calculation has not been     validated in all clinical     situations.     eGFR's  persistently     <90 mL/min signify     possible Chronic Kidney Disease.    No results found.  Post Admission Physician Evaluation:  1. Functional deficits secondary to failed right femur ORIF s/p revision.. 2. Patient is admitted to receive collaborative, interdisciplinary care between the physiatrist, rehab nursing staff, and therapy team. 3. Patient's level of medical complexity and substantial therapy needs in context of that medical necessity cannot be provided at a lesser intensity of care such as a SNF. 4. Patient has experienced substantial functional loss from his/her baseline which was documented above under the "Functional History" and "Functional Status" headings. Judging by the patient's diagnosis, physical exam, and functional history, the patient has potential for functional progress which will result in measurable gains while on inpatient rehab. These gains will be of substantial and practical use upon discharge in facilitating mobility and self-care at the household level. 5. Physiatrist will provide 24 hour management of medical needs as well as oversight of the therapy plan/treatment and provide guidance as appropriate regarding the interaction of the two. 6. 24 hour rehab nursing will assist with bladder management, bowel management, safety, skin/wound care, disease management, medication administration, pain management and patient education and help integrate therapy concepts, techniques,education, etc. 7. PT will assess and treat for/with: Lower extremity strength, range of motion, stamina, balance, functional mobility, safety, adaptive techniques and equipment, pain mgt, ortho education. Goals are: mod I to supervision. 8. OT will assess and treat for/with: ADL's, functional mobility, safety, upper extremity strength, adaptive techniques and equipment, pain mgt, ortho ed. Goals are: mod I to set up. 9. SLP will assess and treat for/with: n/a. Goals are: n/a. 10. Case Management  and Social Worker will assess and treat for psychological issues and discharge planning. 11. Team conference will be held weekly to assess progress toward goals and to determine barriers to discharge. 12. Patient will receive at least 3 hours of therapy per day at least 5 days per week. 13. ELOS:  7-10 days Prognosis: excellent   Medical Problem List and Plan:  1. DVT Prophylaxis/Anticoagulation: Pharmaceutical: Xarelto  2. Pain Management: prn ultram. Will decrease celebrex to once a day.  3. Mood: Pleasant and appropriate with good overall outlook. Will have LCSW follow for evaluation.  4. Neuropsych: This patient is capable of making decisions on his/her own behalf.  5. ABLA: continue iron supplement. Improved past transfusion. Recheck in am.  6. OSA: CPAP with oxygen when asleep.   7. Recent sinusitis: Complete Augmentin D#9/10  8. Asthma: Monitor for dyspnea with activity. Continue symbicort and Flonase.   -encourage IS 9. HTN: Will monitor with bid checks. Continue cozaar, lasix, and bystolic.  Earna Coder  Ermalene Postin, MD, [place] [facility]   11/02/2012

## 2012-11-02 NOTE — Progress Notes (Addendum)
RT assisted patient with CPAP. Patient placed on home nasal pillows and 2L of oxygen bled in. Patient states she is comfortable and aware to call if she needs further assistance.

## 2012-11-02 NOTE — Plan of Care (Signed)
Overall Plan of Care (IPOC) Patient Details Name: Tracey Nguyen MRN: [id number] DOB: April 25, 1941  Diagnosis:  Failed right femur orif s/p revision  Co-morbidities: ABLA, OSA, htn, asthma, htn  Functional Problem List  Patient demonstrates impairments in the following areas: Balance, Endurance and Motor  Basic ADL's: grooming, bathing, dressing and toileting Advanced ADL's: simple meal preparation  Transfers:  bed mobility, bed to chair, toilet, tub/shower, car and furniture Locomotion:  ambulation and wheelchair mobility  Additional Impairments:  Leisure Awareness  Anticipated Outcomes Item Anticipated Outcome  Eating/Swallowing    Basic self-care  Overall mod I  Tolieting  Mod I  Bowel/Bladder  Continent of bowel and bladder  Transfers  Mod I  Locomotion  Mod I  Communication    Cognition    Pain  </=2  Safety/Judgment  No falls with injury  Other     Therapy Plan: PT Intensity: Minimum of 1-2 x/day ,45 to 90 minutes PT Frequency: 5 out of 7 days PT Duration Estimated Length of Stay: 12-14 days OT Intensity: Minimum of 1-2 x/day, 45 to 90 minutes OT Frequency: 5 out of 7 days OT Duration/Estimated Length of Stay: 7-10 days      Team Interventions: Item RN PT OT SLP SW TR Other  Self Care/Advanced ADL Retraining   x      Neuromuscular Re-Education  x x      Therapeutic Activities  x x      UE/LE Strength Training/ROM  x x      UE/LE Coordination Activities  x x      Visual/Perceptual Remediation/Compensation         DME/Adaptive Equipment Instruction  x x      Therapeutic Exercise  x x      Balance/Vestibular Training  x x      Patient/Family Education  x x      Cognitive Remediation/Compensation  x       Functional Mobility Training  x x      Ambulation/Gait Training  x x      Stair Training  x x      Wheelchair Propulsion/Positioning  x x      Functional Tourist information centre manager Reintegration  x x       Dysphagia/Aspiration Film/video editor         Bladder Management x        Bowel Management x        Disease Management/Prevention x  x      Pain Management x  x      Medication Management x        Skin Care/Wound Management x  x      Splinting/Orthotics   x      Discharge Planning x x x      Psychosocial Support x x x                             Team Discharge Planning: Destination: PT-Home ,OT- Home , SLP-  Projected Follow-up: PT-Home health PT, OT-  None, SLP-  Projected Equipment Needs: PT-Other (comment) (To be determined), OT- None recommended by OT, SLP-  Patient/family involved in discharge planning: PT- Patient,  OT-Patient, SLP-   MD ELOS: 10 days Medical Rehab Prognosis:  Excellent Assessment: The patient has been admitted for CIR therapies. The team will be addressing, functional  mobility, strength, stamina, balance, safety, adaptive techniques/equipment, self-care, bowel and bladder mgt, patient and caregiver education, pain mgt, ortho precautions, education. Goals have been set at modified independent.    Ranelle Oyster, MD, FAAPMR      See Team Conference Notes for weekly updates to the plan of care

## 2012-11-02 NOTE — PMR Pre-admission (Signed)
PMR Admission Coordinator Pre-Admission Assessment  Patient: Tracey Nguyen is an 72 y.o., female MRN: [id number] DOB: 1941-05-04 Height: 4\' 10"  (147.3 cm) Weight: 72.576 kg (160 lb)              Insurance Information HMO:     PPO:      PCP:      IPA:      80/20: yes     OTHER:  No HMO PRIMARY: Medicare a and b      Policy#: [id number]      Subscriber: pt Benefits:  Phone #: online     Name: 11/01/12 Eff. Date: 01/07/05     Deduct: $1216      Out of Pocket Max: none      Life Max: none CIR: 100%      SNF: 20 full days Outpatient: 80%     Co-Pay: 20% Home Health: 100%      Co-Pay: none DME: 80%     Co-Pay: 20% Providers: pt choice  SECONDARY: [employer] [place]      Policy#: [id number]      Subscriber: pt No auth with medicare primary and BCBS a supplement  Emergency Contact Information Contact Information   Name Relation Home Work Mobile   Therien,Philip Spouse [phone]  [phone]     Current Medical History  Patient Admitting Diagnosis: Revision of ORIF right femur fx  History of Present Illness:Tracey Nguyen is a 72 y.o. 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 Charlann Boxer on 10/30/12. Post op with ABLA with hgb 7.4 and was transfused with 2 units PRBC. 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 Therapies initiated and patient limited by TDWB and pain.  Past Medical History  Past Medical History  Diagnosis Date  . Asthma   . Arthritis   . Cancer 2007    breast cancer  . GERD (gastroesophageal reflux disease)   . Hypertension   . Osteoporosis   . Sleep apnea     on cpap  . Blood transfusion   . Cataract     BILATERAL -REMOVED VIA SURGERY  . History of D&C   . Pneumonia     hx of  . Headache     hx of migraines   . Complication of anesthesia     hard to wake up in 1970    Family History  family history is not on file.  Prior Rehab/Hospitalizations: CIR 2010 after initial fx   Current Medications  Current facility-administered medications:albuterol-ipratropium (COMBIVENT) inhaler 2 puff, 2 puff, Inhalation, Q6H PRN, Genelle Gather Babish, PA-C;  alum & mag hydroxide-simeth (MAALOX/MYLANTA) 200-200-20 MG/5ML suspension 30 mL, 30 mL, Oral, Q4H PRN, Genelle Gather Babish, PA-C;  amoxicillin-clavulanate (AUGMENTIN) 875-125 MG per tablet 1 tablet, 1 tablet, Oral, BID, Genelle Gather [place], PA-C, 1 tablet at 11/02/12 3875 bisacodyl (DULCOLAX) suppository 10 mg, 10 mg, Rectal, Daily PRN, Genelle Gather Babish, PA-C;  budesonide-formoterol [employer]) 160-4.5 MCG/ACT inhaler 2 puff, 2 puff, Inhalation, BID, Genelle Gather Babish, PA-C, 2 puff at 11/01/12 2054;  celecoxib (CELEBREX) capsule 200 mg, 200 mg, Oral, Q12H, Genelle Gather Babish, PA-C, 200 mg at 11/02/12 0957;  diphenhydrAMINE (BENADRYL) capsule 25 mg, 25 mg, Oral, Q6H PRN, Genelle Gather Babish, PA-C docusate sodium (COLACE) capsule 100  mg, 100 mg, Oral, BID, Genelle Gather Babish, PA-C, 100 mg at 11/02/12 6213;  DULoxetine (CYMBALTA) DR capsule 30 mg, 30 mg, Oral, Daily, Genelle Gather Babish, PA-C, 30 mg at 11/02/12 0865;  ferrous sulfate tablet 325 mg, 325 mg, Oral, TID PC, Genelle Gather Babish, PA-C;  fluticasone [facility]) 50 MCG/ACT nasal spray 1 spray, 1 spray, Each Nare, Daily, Genelle Gather Babish, PA-C, 1 spray at 11/02/12 1000 furosemide (LASIX) tablet 40 mg, 40 mg, Oral, QAC breakfast, Genelle Gather Babish, PA-C, 40 mg at 11/02/12 0831;  HYDROcodone-acetaminophen (NORCO) 7.5-325 MG per tablet 1-2 tablet, 1-2 tablet, Oral, Q4H, Genelle Gather [place], PA-C, 1 tablet at 10/31/12 0404;  HYDROmorphone (DILAUDID) injection 0.5-2 mg, 0.5-2 mg, Intravenous, Q2H PRN, Genelle Gather Babish, PA-C losartan (COZAAR) tablet 100 mg, 100 mg, Oral, QAC breakfast, Genelle Gather Babish, PA-C, 100 mg at 11/02/12 7846;  magic mouthwash, 15 mL, Oral, TID, Genelle Gather Babish, PA-C, 15 mL at 11/02/12 9629;  menthol-cetylpyridinium (CEPACOL) lozenge 3 mg, 1 lozenge, Oral, PRN, Genelle Gather Babish, PA-C;  methocarbamol (ROBAXIN) tablet 500 mg, 500 mg, Oral, Q6H PRN, Genelle Gather Bab[place], PA-C, 500 mg at 10/30/12 2005 metoCLOPramide (REGLAN) injection 5-10 mg, 5-10 mg, Intravenous, Q8H PRN, Genelle Gather Babish, PA-C;  metoCLOPramide (REGLAN) tablet 5-10 mg, 5-10 mg, Oral, Q8H PRN, Genelle Gather Babish, PA-C;  nebivolol (BYSTOLIC) tablet 5 mg, 5 mg, Oral, QPC breakfast, Genelle Gather Babish, PA-C, 5 mg at 11/02/12 0831;  ondansetron [facility]) injection 4 mg, 4 mg, Intravenous, Q6H PRN, Genelle Gather Babish, PA-C, 4 mg at 10/31/12 1508 ondansetron (ZOFRAN) tablet 4 mg, 4 mg, Oral, Q6H PRN, Genelle Gather Babish, PA-C;  pantoprazole (PROTONIX) EC tablet 40 mg, 40 mg, Oral, Daily, Genelle Gather Babish, PA-C, 40 mg at 11/02/12 0956;  phenol (CHLORASEPTIC) mouth spray 1 spray, 1 spray, Mouth/Throat, PRN, Genelle Gather Babish, PA-C;  polyethylene glycol (MIRALAX / GLYCOLAX) packet 17 g, 17 g, Oral, BID, Genelle Gather Babish, PA-C, 17 g at 11/01/12 5284 pramipexole (MIRAPEX) tablet 1 mg, 1 mg, Oral, QPM, Genelle Gather [place], PA-C, 1 mg at 11/01/12 2123;  rivaroxaban (XARELTO) tablet 10 mg, 10 mg, Oral, Q24H, Genelle Gather [place], PA-C, 10 mg at 11/02/12 1324;  sodium chloride 0.9 % 1,000 mL with potassium chloride 10 mEq infusion, 100 mL/hr, Intravenous, Continuous, Genelle Gather Babish, PA-C, 100 mL/hr at 10/31/12 0200 traMADol (ULTRAM) tablet 50-100 mg, 50-100 mg, Oral, Q6H PRN, [name], MD, 50 mg at 11/02/12 0830;  zolpidem (AMBIEN) tablet 5 mg, 5 mg, Oral, QHS PRN, Genelle Gather Babish, PA-C  Patients Current Diet: General  Precautions / Restrictions Precautions Precautions: Fall Restrictions Weight Bearing Restrictions: Yes RLE Weight Bearing: Touchdown weight  bearing Other Position/Activity Restrictions: R LE   Prior Activity Level Limited Community (1-2x/wk): limited  Home Assistive Devices / Equipment Home Assistive Devices/Equipment: Eyeglasses;Dentures (specify type);Wheelchair;Walker (specify type);Cane (specify quad or straight) Home Adaptive Equipment: Bedside commode/3-in-1;Grab bars in shower;Tub transfer bench;Walker - rolling;Reacher CPAP at hs with O2 at 2 liters  Prior Functional Level Prior Function Level of Independence: Independent with assistive device(s) Needs Assistance: Bathing Bath: Minimal Able to Take Stairs?: No Driving: No Vocation: Unemployed Comments: used cane pta. Spouse would not let her drive.   Current Functional Level Cognition  Arousal/Alertness: Awake/alert Overall Cognitive Status: Appears within functional limits for tasks assessed/performed Orientation Level: Oriented X4    Extremity Assessment (includes Sensation/Coordination)  RUE ROM/Strength/Tone: WFL for tasks assessed  RLE ROM/Strength/Tone: Deficits RLE ROM/Strength/Tone Deficits: 2/5 with AAROM to 75 hip flex and  20 abd    ADLs  Eating/Feeding: Performed;Independent Where Assessed - Eating/Feeding: Bed level Grooming: Performed;Wash/dry hands;Modified independent;Set up Where Assessed - Grooming: Supine, head of bed up Upper Body Bathing: Simulated;Minimal assistance Where Assessed - Upper Body Bathing: Supine, head of bed up Lower Body Bathing: Simulated;+2 Total assistance Lower Body Bathing: Patient Percentage: 40% Where Assessed - Lower Body Bathing: Supported sit to stand Upper Body Dressing: Performed;Set up Where Assessed - Upper Body Dressing: Unsupported sitting Lower Body Dressing: Performed;+2 Total assistance Lower Body Dressing: Patient Percentage: 40% Where Assessed - Lower Body Dressing: Supported sit to stand Toilet Transfer: Simulated;+2 Total assistance Toilet Transfer: Patient Percentage: 40% Statistician  Method: Surveyor, minerals: Materials engineer and Hygiene: Simulated;+2 Total assistance (Secondary to TDWB R LE) Toileting - Clothing Manipulation and Hygiene: Patient Percentage: 10% Where Assessed - Toileting Clothing Manipulation and Hygiene: Sit to stand from 3-in-1 or toilet;Standing Tub/Shower Transfer Method: Not assessed Equipment Used: Gait belt;Rolling walker;Sock aid;Reacher Transfers/Ambulation Related to ADLs: Pt sit to stand x2 w/ +2 assist from EOB in preparation for transfers. Pt fatigues easily & currently receiving blood. Cont to assess. ADL Comments: Pt performed sit to stand multiple times with as a precursor to I with ADL activity. Pt min A with sit to stand from chair focusing on hand placement.     Mobility  Bed Mobility: Sit to Supine Supine to Sit: 3: Mod assist Supine to Sit: Patient Percentage: 50% Sitting - Scoot to Edge of Bed: 3: Mod assist;4: Min assist Sit to Supine: 1: +2 Total assist;HOB flat;Other (comment) (Upper and lower body assist) Sit to Supine: Patient Percentage: 0%    Transfers  Transfers: Sit to Stand;Stand to Sit Sit to Stand: 4: Min assist;From chair/3-in-1;With upper extremity assist Sit to Stand: Patient Percentage: 60% Stand to Sit: 4: Min assist;To chair/3-in-1;With upper extremity assist Stand to Sit: Patient Percentage: 70%    Ambulation / Gait / Stairs / Wheelchair Mobility  Ambulation/Gait Ambulation/Gait Assistance: 1: +2 Total assist Ambulation/Gait: Patient Percentage: 70% Ambulation Distance (Feet): 11 Feet Assistive device: Rolling walker Ambulation/Gait Assistance Details: min cues for posture, position from RW and sequence  Gait Pattern: Step-to pattern;Decreased step length - right;Decreased step length - left;Decreased stance time - right General Gait Details: Pt doing remarkably well complying with TWB Stairs: No    Posture / Balance Static Sitting Balance Static  Sitting - Balance Support: Bilateral upper extremity supported;Feet supported (L LLE supported) Static Sitting - Level of Assistance: 5: Stand by assistance;4: Min assist    Special needs/care consideration BiPAP/CPAP CPAP at hs with O2 at 2 liters                               Bowel mgmt: continent Bladder mgmt: continent    Previous Home Environment Living Arrangements: Spouse/significant other Lives With: Spouse Available Help at Discharge: Family Type of Home: House Home Layout: One level Home Access: Ramped entrance Bathroom Shower/Tub: Tub/shower unit;Walk-in shower Bathroom Toilet: Standard Bathroom Accessibility: Yes How Accessible: Accessible via walker Home Care Services: No Additional Comments: home cpap and o2 at 2 liters at [facility]  Discharge Living Setting Plans for Discharge Living Setting: Patient's home;Lives with (comment) (spouse) Type of Home at Discharge: House Discharge Home Layout: One level Discharge Home Access: Ramped entrance Discharge Bathroom Shower/Tub: Tub/shower unit;Walk-in shower Discharge Bathroom Toilet: Standard Discharge Bathroom Accessibility: Yes How Accessible: Accessible via walker Do you  have any problems obtaining your medications?: No  Social/Family/Support Systems Patient Roles: Spouse;Parent Contact Information: Aneta Mins , spouse Anticipated Caregiver: spouse work parttime 3 days per week 8 hrs/day Anticipated Industrial/product designer Information: see above Ability/Limitations of Caregiver: works parttime 3 days per week 8 hr days. will come home for lunch. office is 10 mins from home Caregiver Availability: Intermittent Discharge Plan Discussed with Primary Caregiver: Yes Is Caregiver In Agreement with Plan?: Yes Does Caregiver/Family have Issues with Lodging/Transportation while Pt is in Rehab?: No    Goals/Additional Needs Patient/Family Goal for Rehab: MOd I PT short distances, s to min OT Expected length of stay: ELOS 8 to 12  days Additional Information: h/o breast CA 2007. will try wearing her bra at rehab as she does at home. Modest about the surgery site Pt/Family Agrees to Admission and willing to participate: Yes Program Orientation Provided & Reviewed with Pt/Caregiver Including Roles  & Responsibilities: Yes   Decrease burden of Care through IP rehab admission: n/a Possible need for SNF placement upon discharge: not anticipated  Patient Condition: This patient's condition remains as documented in the Consult dated 11/02/12, in which the Rehabilitation Physician determined and documented that the patient's condition is appropriate for intensive rehabilitative care in an inpatient rehabilitation facility.  Preadmission Screen Completed By:  Clois Dupes, 11/02/2012 11:21 AM ______________________________________________________________________   Discussed status with Dr. Riley Kill on 11/02/12 at 1120 and received telephone approval for admission today.  Admission Coordinator:  Clois Dupes, time 1120 Date 11/02/12.

## 2012-11-02 NOTE — Progress Notes (Signed)
   Subjective: 3 Days Post-Op Procedure(s) (LRB): revision right femur INTRAMEDULLARY NAILING (Right)   Patient reports pain as mild, pain well controlled. No events throughout the night. Ready to be discharged to rehab.  Objective:   VITALS:   Filed Vitals:   11/02/12 0832  BP: 115/78  Pulse: 85  Temp:   Resp: 18    Neurovascular intact Dorsiflexion/Plantar flexion intact Incision: dressing C/D/I No cellulitis present Compartment soft  LABS  Recent Labs  10/31/12 0505 11/01/12 0430  HGB 7.4* 10.1*  HCT 23.6* 31.1*  WBC 7.2 8.3  PLT 149* 133*     Recent Labs  10/31/12 0505 11/01/12 0430  NA 138 137  K 3.8 4.2  BUN 20 21  CREATININE 1.36* 1.34*  GLUCOSE 102* 148*     Assessment/Plan: 3 Days Post-Op Procedure(s) (LRB): revision right femur INTRAMEDULLARY NAILING (Right) TDWB right leg Up with therapy Discharge to SNF  ABLA  Treated with 2 units of blood yesterday, H&H increased.  Treated with iron now and will observe.   Obese (BMI 30-39.9)  Estimated body mass index is 33.45 kg/(m^2) as calculated from the following:     Height as of this encounter: 4\' 10"  (1.473 m).     Weight as of this encounter: 72.576 kg (160 lb).  Patient also counseled that weight may inhibit the healing process  Patient counseled that losing weight will help with future health issues   Anastasio Auerbach. Meshia Rau   PAC  11/02/2012, 1:50 PM

## 2012-11-02 NOTE — Progress Notes (Signed)
Occupational Therapy Treatment Patient Details Name: Tracey Nguyen MRN: [id number] DOB: June 03, 1941 Today's Date: 11/02/2012 Time: 4098-1191 OT Time Calculation (min): 24 min  OT Assessment / Plan / Recommendation    Follow Up Recommendations  SNF;CIR             Frequency Min 2X/week   Plan Discharge plan remains appropriate    Precautions / Restrictions Precautions Precautions: Fall Restrictions Weight Bearing Restrictions: Yes RLE Weight Bearing: Touchdown weight bearing Other Position/Activity Restrictions: R LE       ADL  ADL Comments: Pt performed sit to stand multiple times with as a precursor to I with ADL activity. Pt min A with sit to stand from chair focusing on hand placement.       OT Goals ADL Goals ADL Goal: Toilet Transfer - Progress: Progressing toward goals  Visit Information  Last OT Received On: 11/02/12    Subjective Data  Subjective: Pt agreeable to to sit to stand focusing on TDWB      Cognition  Cognition Overall Cognitive Status: Appears within functional limits for tasks assessed/performed Arousal/Alertness: Awake/alert Orientation Level: Appears intact for tasks assessed Behavior During Session: [facility] for tasks performed    Mobility  Transfers Transfers: Sit to Stand;Stand to Sit Sit to Stand: 4: Min assist;From chair/3-in-1;With upper extremity assist Stand to Sit: 4: Min assist;To chair/3-in-1;With upper extremity assist          End of Session OT - End of Session Activity Tolerance: Patient tolerated treatment well Patient left: in chair;with family/visitor present  GO     Tracey Nguyen, [place] Kung 11/02/2012, 9:50 AM

## 2012-11-02 NOTE — Progress Notes (Signed)
I met with pt and her husband at bedside. Both are in agreement to IP rehab admission and prefer CIR over SNF at this time. I can admit today. I will arrange. RN CM and SW are aware as well as Dr. Charlann Boxer. [phone]

## 2012-11-02 NOTE — Progress Notes (Signed)
Physical Therapy Treatment Patient Details Name: Tracey Nguyen MRN: [id number] DOB: October 08, 1940 Today's Date: 11/02/2012 Time: 1308-6578 PT Time Calculation (min): 17 min  PT Assessment / Plan / Recommendation Comments on Treatment Session       Follow Up Recommendations  CIR     Does the patient have the potential to tolerate intense rehabilitation     Barriers to Discharge        Equipment Recommendations  None recommended by PT    Recommendations for Other Services OT consult  Frequency Min 5X/week   Plan Discharge plan remains appropriate    Precautions / Restrictions Precautions Precautions: Fall Restrictions Weight Bearing Restrictions: Yes RLE Weight Bearing: Touchdown weight bearing Other Position/Activity Restrictions: R LE   Pertinent Vitals/Pain Min c/o pain    Mobility  Bed Mobility Bed Mobility: Sit to Supine Sit to Supine: 4: Min assist;3: Mod assist Details for Bed Mobility Assistance: cues for use of L LE and sequencing, pt assisted with pad to EOB Transfers Transfers: Sit to Stand;Stand to Sit Sit to Stand: 4: Min assist;From chair/3-in-1;With upper extremity assist Stand to Sit: 4: Min assist;With upper extremity assist;To bed Details for Transfer Assistance: Cues for LE management and use of UEs to self assist Ambulation/Gait Ambulation/Gait Assistance: 4: Min assist Ambulation Distance (Feet): 22 Feet Assistive device: Rolling walker Ambulation/Gait Assistance Details: cues for position from RW, posture and TWB Gait Pattern: Step-to pattern;Decreased step length - right;Decreased step length - left;Decreased stance time - right    Exercises General Exercises - Lower Extremity Ankle Circles/Pumps: AROM;Both;Supine;15 reps Quad Sets: AROM;Both;Supine;15 reps Gluteal Sets: AROM;Both;10 reps;Supine Heel Slides: AAROM;Supine;Right;15 reps Hip ABduction/ADduction: AAROM;Right;Supine;15 reps   PT Diagnosis:    PT Problem List:   PT  Treatment Interventions:     PT Goals Acute Rehab PT Goals PT Goal Formulation: With patient Time For Goal Achievement: 11/06/12 Potential to Achieve Goals: Fair Pt will go Supine/Side to Sit: with min assist PT Goal: Supine/Side to Sit - Progress: Progressing toward goal Pt will go Sit to Supine/Side: with min assist PT Goal: Sit to Supine/Side - Progress: Progressing toward goal Pt will go Sit to Stand: with min assist PT Goal: Sit to Stand - Progress: Progressing toward goal Pt will go Stand to Sit: with min assist PT Goal: Stand to Sit - Progress: Progressing toward goal Pt will Ambulate: 1 - 15 feet;with +2 total assist;with rolling walker PT Goal: Ambulate - Progress: Progressing toward goal  Visit Information  Last PT Received On: 11/02/12 Assistance Needed: +1    Subjective Data  Subjective: I'm going to rehab today Patient Stated Goal: Rehab and home   Cognition  Cognition Overall Cognitive Status: Appears within functional limits for tasks assessed/performed Arousal/Alertness: Awake/alert Orientation Level: Appears intact for tasks assessed Behavior During Session: [facility] for tasks performed    Balance     End of Session PT - End of Session Equipment Utilized During Treatment: Gait belt Activity Tolerance: Patient tolerated treatment well Patient left: in bed;with call bell/phone within reach Nurse Communication: Mobility status   GP     Tracey Nguyen 11/02/2012, 1:03 PM

## 2012-11-03 ENCOUNTER — Inpatient Hospital Stay (HOSPITAL_COMMUNITY): Payer: Medicare Other | Admitting: Physical Therapy

## 2012-11-03 ENCOUNTER — Inpatient Hospital Stay (HOSPITAL_COMMUNITY): Payer: Medicare Other

## 2012-11-03 ENCOUNTER — Inpatient Hospital Stay (HOSPITAL_COMMUNITY): Payer: Medicare Other | Admitting: Occupational Therapy

## 2012-11-03 DIAGNOSIS — J322 Chronic ethmoidal sinusitis: Secondary | ICD-10-CM | POA: Diagnosis present

## 2012-11-03 LAB — COMPREHENSIVE METABOLIC PANEL
ALT: 5 U/L (ref 0–35)
AST: 12 U/L (ref 0–37)
Alkaline Phosphatase: 73 U/L (ref 39–117)
CO2: 30 mEq/L (ref 19–32)
Calcium: 9.2 mg/dL (ref 8.4–10.5)
GFR calc Af Amer: 41 mL/min — ABNORMAL LOW (ref >90)
Glucose, Bld: 96 mg/dL (ref 70–99)
Potassium: 4.8 mEq/L (ref 3.5–5.1)
Sodium: 143 mEq/L (ref 135–145)
Total Protein: 5.4 g/dL — ABNORMAL LOW (ref 6.0–8.3)

## 2012-11-03 LAB — CBC WITH DIFFERENTIAL/PLATELET
Basophils Absolute: 0.1 10*3/uL (ref 0.0–0.1)
Basophils Relative: 1 % (ref 0–1)
HCT: 31.4 % — ABNORMAL LOW (ref 36.0–46.0)
Hemoglobin: 9.8 g/dL — ABNORMAL LOW (ref 12.0–15.0)
Lymphocytes Relative: 17 % (ref 12–46)
Monocytes Relative: 9 % (ref 3–12)
Neutro Abs: 5.4 10*3/uL (ref 1.7–7.7)
Neutrophils Relative %: 59 % (ref 43–77)
RBC: 4.49 MIL/uL (ref 3.87–5.11)
WBC: 9.2 10*3/uL (ref 4.0–10.5)

## 2012-11-03 MED ORDER — DOCUSATE SODIUM 100 MG PO CAPS
100.0000 mg | ORAL_CAPSULE | Freq: Two times a day (BID) | ORAL | Status: DC | PRN
  Administered 2012-11-06: 100 mg via ORAL
  Filled 2012-11-03: qty 1

## 2012-11-03 MED ORDER — POLYETHYLENE GLYCOL 3350 17 G PO PACK
17.0000 g | PACK | Freq: Every day | ORAL | Status: DC
  Administered 2012-11-06 – 2012-11-08 (×3): 17 g via ORAL
  Filled 2012-11-03 (×7): qty 1

## 2012-11-03 NOTE — Progress Notes (Signed)
Inpatient Rehabilitation Center Individual Statement of Services  Patient Name:  Tracey Nguyen  Date:  11/03/2012  Welcome to the Inpatient Rehabilitation Center.  Our goal is to provide you with an individualized program based on your diagnosis and situation, designed to meet your specific needs.  With this comprehensive rehabilitation program, you will be expected to participate in at least 3 hours of rehabilitation therapies Monday-Friday, with modified therapy programming on the weekends.  Your rehabilitation program will include the following services:  Physical Therapy (PT), Occupational Therapy (OT), 24 hour per day rehabilitation nursing, Therapeutic Recreaction (TR), Case Management ( Social Worker), Rehabilitation Medicine, Nutrition Services and Pharmacy Services  Weekly team conferences will be held on Tuesdays to discuss your progress.  Your  Social Worker will talk with you frequently to get your input and to update you on team discussions.  Team conferences with you and your family in attendance may also be held.  Expected length of stay: 10 days  Overall anticipated outcome: supervision to modified                                                                                                                        independent  Depending on your progress and recovery, your program may change.  Your  Social Worker will coordinate services and will keep you informed of any changes.  Your  Social Worker's name and contact numbers are listed  below.  The following services may also be recommended but are not provided by the Inpatient Rehabilitation Center:   Driving Evaluations  Home Health Rehabiltiation Services  Outpatient Rehabilitatation Servives    Arrangements will be made to provide these services after discharge if needed.  Arrangements include referral to agencies that provide these services.  Your insurance has been verified to be:  Medicare and BCBS Your  primary doctor is:  Dr. Kelle Darting  Pertinent information will be shared with your doctor and your insurance company.   Social Worker:  [place], [place] [phone] or [phone]  Information discussed with and copy given to patient by: Amada Jupiter, 11/03/2012, 1:28 PM

## 2012-11-03 NOTE — Progress Notes (Signed)
Occupational Therapy Note  Patient Details  Name: MARCHETA HORSEY MRN: [id number] Date of Birth: [birth year] Today's Date: 11/03/2012  Time: 1400-1445 Pt initially rated pain at 1/10 but increased to 8/10 with activity; right hip; RN aware and repositioned Individual Therapy  Pt engaged in practicing tub bench transfers and functional amb with RW for home mgmt tasks. Pt was able to recount WBing restrictions but required min verbal cues to adhere to restrictions with ambulation.  Pt required assistance with lifting RLE into and out of tub during transfer.  Pt stated that pain increased during task.    [name] 11/03/2012, 2:51 PM

## 2012-11-03 NOTE — Progress Notes (Signed)
Pt. Placed herself on cpap. RT connected oxygen and made sure settings were accurate. Pt. Is tolerating well.

## 2012-11-03 NOTE — Progress Notes (Signed)
Social Work  Social Work Assessment and Plan  Patient Details  Name: Tracey Nguyen MRN: [id number] Date of Birth: 04/08/1941  Today's Date: 11/03/2012  Problem List:  Patient Active Problem List  Diagnosis  . HYPERTHYROIDISM  . DEPRESSION  . PERIPHERAL NEUROPATHY  . ALLERGIC RHINITIS  . Moderate persistent asthma with allergic factors  . GERD  . TENDINITIS, TIBIALIS  . OSTEOPOROSIS  . CHEST DISCOMFORT  . HYPERTENSION NEC  . BREAST CANCER, HX OF  . COLONIC POLYPS, HX OF  . S/P removal of and implantation of new IM nail, right hip  . Acute blood loss anemia  . Obesity (BMI 30.0-34.9)  . Ethmoid sinusitis--recent flare   Past Medical History:  Past Medical History  Diagnosis Date  . Asthma   . Arthritis   . Cancer 2007    breast cancer  . GERD (gastroesophageal reflux disease)   . Hypertension   . Osteoporosis   . Sleep apnea     on cpap  . Blood transfusion   . Cataract     BILATERAL -REMOVED VIA SURGERY  . History of D&C   . Pneumonia     hx of  . Headache     hx of migraines  . Complication of anesthesia     hard to wake up in 1970   Past Surgical History:  Past Surgical History  Procedure Laterality Date  . Femur fracture surgery  2011    titanium rod/right  . Mastectomy  2007    right  . Hemorrhoidectomy  1987  . Breast biopsy  1995    left  . Cataract extraction, bilateral  2006  . Dilation and curettage of uterus    . Femur im nail Right 10/30/2012    Procedure: revision right femur INTRAMEDULLARY NAILING;  Surgeon: [name], MD;  Location: [facility];  Service: Orthopedics;  Laterality: Right;   Social History:  reports that she quit smoking about 48 years ago. She has never used smokeless tobacco. She reports that she does not drink alcohol or use illicit drugs.  Family / Support Systems Marital Status: Married How Long?: 47 yrs Patient Roles: Spouse;Parent Spouse/Significant Other: husband, Lourene Hoston @ (H) [phone] or  (C) [phone] Children: one son, Gery Pray, who lives and works near [place], [place] Anticipated Caregiver: spouse work parttime 3 days per week 8 hrs/day Ability/Limitations of Caregiver: works parttime 3 days per week 8 hr days. will come home for lunch. 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 Recent Psychosocial Issues: None Pyschiatric History: None Substance Abuse History: None  Patient / Family Perceptions, Expectations & Goals Pt/Family understanding of illness & functional limitations: Pt able to offer basic explaniation of surgery and current functional limitations  Premorbid pt/family roles/activities: Independent at home overall - husband is primary wage earner.  Notes they "share" household responsibilities Anticipated changes in roles/activities/participation: Husband may need to decrease current work hours and increase caregiver support role Pt/family expectations/goals: Pt  hopeful she can reach some mod i goals  [employer]: None Premorbid Home Care/DME Agencies: Other (Comment) [facility]) Transportation available at discharge: yes  Discharge Planning Living Arrangements: Spouse/significant other Support Systems:  Spouse/significant other Type of Residence: Private residence Insurance Resources: Administrator (specify) Herbalist) Financial Resources: Social Security Financial Screen Referred: No Living Expenses: Own Money Management: Spouse Do you have any problems obtaining your medications?: No Home Management: shared with husband Patient/Family Preliminary Plans: Pt plans to d/c home with husband who can provide intermittent assistance. Social Work Anticipated Follow Up Needs: HH/OP Expected length of stay: 10-12 days  Clinical Impression Pleasant, talkative woman here after ortho surgery.  Supportive husband but may only be able to provide intermittent assistance due to work schedule.  No emotional distress reported or noted - will monitor.  Continue to follow for d/c planning needs.  Ashliegh Parekh 11/03/2012, 3:12 PM

## 2012-11-03 NOTE — Progress Notes (Signed)
Patient information reviewed and entered into eRehab system by Mattilyn Crites, RN, CRRN, PPS Coordinator.  Information including medical coding and functional independence measure will be reviewed and updated through discharge.     Per nursing patient was given "Data Collection Information Summary for Patients in Inpatient Rehabilitation Facilities with attached "Privacy Act Statement-Health Care Records" upon admission.  

## 2012-11-03 NOTE — Evaluation (Signed)
Occupational Therapy Assessment and Plan & Session Note  Patient Details  Name: Tracey Nguyen MRN: [id number] Date of Birth: Sep 05, 1940  OT Diagnosis: abnormal posture, acute pain and muscle weakness (generalized) Rehab Potential: Rehab Potential: Excellent ELOS: 7-10 days   Today's Date: 11/03/2012  ASSESSMENT AND PLAN  Problem List:  Patient Active Problem List  Diagnosis  . HYPERTHYROIDISM  . DEPRESSION  . PERIPHERAL NEUROPATHY  . ALLERGIC RHINITIS  . Moderate persistent asthma with allergic factors  . GERD  . TENDINITIS, TIBIALIS  . OSTEOPOROSIS  . CHEST DISCOMFORT  . HYPERTENSION NEC  . BREAST CANCER, HX OF  . COLONIC POLYPS, HX OF  . S/P removal of and implantation of new IM nail, right hip  . Acute blood loss anemia  . Obesity (BMI 30.0-34.9)  . Ethmoid sinusitis--recent flare    Past Medical History:  Past Medical History  Diagnosis Date  . Asthma   . Arthritis   . Cancer 2007    breast cancer  . GERD (gastroesophageal reflux disease)   . Hypertension   . Osteoporosis   . Sleep apnea     on cpap  . Blood transfusion   . Cataract     BILATERAL -REMOVED VIA SURGERY  . History of D&C   . Pneumonia     hx of  . Headache     hx of migraines  . Complication of anesthesia     hard to wake up in 1970   Past Surgical History:  Past Surgical History  Procedure Laterality Date  . Femur fracture surgery  2011    titanium rod/right  . Mastectomy  2007    right  . Hemorrhoidectomy  1987  . Breast biopsy  1995    left  . Cataract extraction, bilateral  2006  . Dilation and curettage of uterus    . Femur im nail Right 10/30/2012    Procedure: revision right femur INTRAMEDULLARY NAILING;  Surgeon: [name], MD;  Location: [facility];  Service: Orthopedics;  Laterality: Right;    Clinical Impression: Tracey Nguyen is a 72 y.o. 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 Charlann Boxer on 10/30/12. Post op with ABLA with hgb 7.4 and was transfused with 2 units PRBC. 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 Therapies initiated and patient limited by TDWB and pain. Therapy team recommended CIR and patient admitted today for further therapies. Patient transferred to CIR on 11/02/2012 .    Patient currently requires min with basic self-care skills secondary to muscle weakness and muscle joint tightness and decreased sitting balance, decreased standing balance, decreased postural control, decreased balance strategies and difficulty maintaining precautions.  Prior to hospitalization, patient could complete ADLs and IADLs independently.   Patient will benefit from skilled intervention to increase independence with basic self-care skills prior to discharge home with care partner.  Anticipate patient will require intermittent supervision and no further OT follow recommended.  OT - End of Session Activity Tolerance: Tolerates 10 - 20 min activity with multiple rests Endurance Deficit: Yes OT Assessment Rehab Potential: Excellent Barriers to Discharge: None Barriers to Discharge Comments: None known at this time OT Plan OT Intensity: Minimum of 1-2 x/day, 45 to 90 minutes OT Frequency: 5 out of  7 days OT Duration/Estimated Length of Stay: 7-10 days OT Treatment/Interventions: Balance/vestibular training;Community reintegration;Discharge planning;DME/adaptive equipment instruction;Functional mobility training;Neuromuscular re-education;Pain management;Patient/family education;Psychosocial support;Self Care/advanced ADL retraining;Skin care/wound managment;Splinting/orthotics;Therapeutic Activities;Therapeutic Exercise;UE/LE Strength taining/ROM;UE/LE Coordination activities;Wheelchair  propulsion/positioning OT Recommendation Recommendations for Other Services:  (None at this time) Patient destination: Home Follow Up Recommendations: None Equipment Recommended: None recommended by OT Equipment Details: Patient states she has a BSC and tub transfer bench at home  Precautions/Restrictions  Precautions Precautions: Fall Restrictions Weight Bearing Restrictions: Yes RLE Weight Bearing: Touchdown weight bearing Other Position/Activity Restrictions: R LE  General Chart Reviewed: Yes Family/Caregiver Present: No  Pain Pain Assessment Pain Assessment: No/denies pain Pain Score: 0-No pain Pain Type: Acute pain Pain Location: Hip Pain Orientation: Right Pain Descriptors: Sore Pain Onset: With Activity Pain Intervention(s):  (declined calling for medication)  Home Living/Prior Functioning Home Living Lives With: Spouse Available Help at Discharge: Family (husband available intermittently) Type of Home: House Home Access: Ramped entrance Home Layout: One level Bathroom Shower/Tub: Tub/shower unit;Walk-in shower Bathroom Toilet: Standard Bathroom Accessibility: Yes How Accessible: Accessible via walker Home Adaptive Equipment: Bedside commode/3-in-1;Tub transfer bench;Walker - rolling;Reacher IADL History Homemaking Responsibilities: Yes Meal Prep Responsibility: Secondary Laundry Responsibility: Secondary Cleaning Responsibility: Secondary Bill Paying/Finance Responsibility: Secondary Shopping Responsibility: Secondary Prior Function Level of Independence: Requires assistive device for independence;Independent with basic ADLs Bath: Minimal Able to Take Stairs?: Yes (only ~3-4 out in community with help) Driving: No Vocation: Unemployed Leisure: Hobbies-yes (Comment) Comments: Liikes to read, sew.   ADL - See FIM  Vision/Perception  Vision - History Baseline Vision: Wears glasses all the time Patient Visual Report: No change from baseline Vision  - Assessment Eye Alignment: Within Functional Limits Perception Perception: Within Functional Limits Praxis Praxis: Intact   Cognition Overall Cognitive Status: Appears within functional limits for tasks assessed Arousal/Alertness: Awake/alert Orientation Level: Oriented X4 Memory: Appears intact Awareness: Appears intact Problem Solving: Appears intact Safety/Judgment: Appears intact  Sensation Sensation Light Touch: Appears Intact Stereognosis: Appears Intact Hot/Cold: Appears Intact Proprioception: Appears Intact Additional Comments: Patient reports her hands "go to sleep" at times Coordination Gross Motor Movements are Fluid and Coordinated: Yes Fine Motor Movements are Fluid and Coordinated: Yes  Motor - See Evaluation Navigator  Mobility - See Evaluation Navigator  Trunk/Postural Assessment - See Evaluation Navigator  Balance- See Evaluation Navigator  Extremity/Trunk Assessment RUE Assessment RUE Assessment: Within Functional Limits (can benefit from UE strengthening) LUE Assessment LUE Assessment: Within Functional Limits (can benefit from UE strengthening)  FIM:  FIM - Eating Eating Activity: 7: Complete independence:no helper FIM - Grooming Grooming Steps: Wash, rinse, dry face;Wash, rinse, dry hands;Oral care, brush teeth, clean dentures;Brush, comb hair Grooming: 5: Set-up assist to obtain items FIM - Bathing Bathing Steps Patient Completed: Chest;Right Arm;Left Arm;Abdomen;Front perineal area;Buttocks;Right upper leg;Left upper leg Bathing: 4: Min-Patient completes 8-9 49f 10 parts or 75+ percent FIM - Upper Body Dressing/Undressing Upper body dressing/undressing steps patient completed: Thread/unthread right bra strap;Thread/unthread left bra strap;Thread/unthread right sleeve of pullover shirt/dresss;Thread/unthread left sleeve of pullover shirt/dress;Put head through opening of pull over shirt/dress Upper body dressing/undressing: 4: Min-Patient  completed 75 plus % of tasks FIM - Lower Body Dressing/Undressing Lower body dressing/undressing: 1: Total-Patient completed less than 25% of tasks FIM - Toileting Toileting steps completed by patient: Adjust clothing prior to toileting;Performs perineal hygiene;Adjust clothing after toileting Toileting: 4: Steadying assist FIM - Diplomatic Services operational officer Devices: Elevated toilet seat;Walker;Grab bars Toilet Transfers: 4-To toilet/BSC: Min A (steadying Pt. > 75%);4-From toilet/BSC: Min A (  steadying Pt. > 75%) FIM - Tub/Shower Transfers Tub/shower Transfers: 0-Activity did not occur or was simulated   Refer to Care Plan for Long Term Goals  Recommendations for other services: None at this time.  Discharge Criteria: Patient will be discharged from OT if patient refuses treatment 3 consecutive times without medical reason, if treatment goals not met, if there is a change in medical status, if patient makes no progress towards goals or if patient is discharged from hospital.  The above assessment, treatment plan, treatment alternatives and goals were discussed and mutually agreed upon: by patient  ---------------------------------------------------------------------------------------------  SESSION NOTE 1000-1100 - 60 Minutes Individual Therapy No complaints of pain Initial 1:1 occupational therapy evaluation completed. Focused skilled intervention on functional ambulation, toilet transfer, toileting, grooming tasks seated at sink, UB/LB bathing & dressing, sit<>stands, dynamic standing balance/tolerance/endurance, adhering to TDWB status -> RLE, and overall activity tolerance/endurance. At end of session left patient seated in w/c with bilateral elevating leg rests donned and call bell & phone within reach.   Talitha Dicarlo 11/03/2012, 11:12 AM

## 2012-11-03 NOTE — Progress Notes (Signed)
Subjective/Complaints: No new complaints. Occasional cough. A 12 point review of systems has been performed and if not noted above is otherwise negative.   Objective: Vital Signs: Blood pressure 115/72, pulse 65, temperature 98.7 F (37.1 C), temperature source Oral, resp. rate 18, height 4\' 9"  (1.448 m), weight 73.1 kg (161 lb 2.5 oz), SpO2 91.00%. No results found.  Recent Labs  11/01/12 0430 11/03/12 0550  WBC 8.3 9.2  HGB 10.1* 9.8*  HCT 31.1* 31.4*  PLT 133* 203    Recent Labs  11/01/12 0430 11/03/12 0550  NA 137 143  K 4.2 4.8  CL 104 107  GLUCOSE 148* 96  BUN 21 21  CREATININE 1.34* 1.44*  CALCIUM 8.5 9.2   CBG (last 3)  No results found for this basename: GLUCAP,  in the last 72 hours  Wt Readings from Last 3 Encounters:  11/02/12 73.1 kg (161 lb 2.5 oz)  10/30/12 72.576 kg (160 lb)  10/30/12 72.576 kg (160 lb)    Physical Exam:  Constitutional: She is oriented to person, place, and time. She appears well-developed and well-nourished.  Morbidly obese  HENT:  Head: Normocephalic and atraumatic.  Eyes: Pupils are equal, round, and reactive to light.  Neck: Normal range of motion.  Cardiovascular: Normal rate and regular rhythm.  Pulmonary/Chest: Effort normal. Chest clear. No rales or rhonchi Abdominal: Bowel sounds are normal. Non distended. Non tender Musculoskeletal: She exhibits edema (Right hip/thigh edema. Dressing on hip with minimal dried blood. ).  Neurological: She is alert and oriented to person, place, and time. Strength 4/5 UE. Lower ext 1+ RHF, 2RKE, 4 ankle. LLE is 3 to 4+/5. No sensory deficits, DTR's 1+ Skin: Skin is warm and dry other than right hip wound.    Assessment/Plan: 1. Functional deficits secondary to failed right femur ORIF s/p revision which require 3+ hours per day of interdisciplinary therapy in a comprehensive inpatient rehab setting. Physiatrist is providing close team supervision and 24 hour management of active  medical problems listed below. Physiatrist and rehab team continue to assess barriers to discharge/monitor patient progress toward functional and medical goals. FIM:                   Comprehension Comprehension Mode: Auditory Comprehension: 5-Understands complex 90% of the time/Cues < 10% of the time  Expression Expression Mode: Verbal Expression: 6-Expresses complex ideas: With extra time/assistive device  Social Interaction Social Interaction: 6-Interacts appropriately with others with medication or extra time (anti-anxiety, antidepressant).  Problem Solving Problem Solving: 5-Solves complex 90% of the time/cues < 10% of the time  Memory Memory: 6-More than reasonable amt of time  Medical Problem List and Plan:  1. DVT Prophylaxis/Anticoagulation: Pharmaceutical: Xarelto  2. Pain Management: prn ultram. celebrex reduced to qday.  3. Mood: Pleasant and appropriate with good overall outlook.    4. Neuropsych: This patient is capable of making decisions on his/her own behalf.  5. ABLA: continue iron supplement. Recheck next week. Count stable 6. OSA: CPAP with oxygen when asleep.  7. Recent sinusitis: augmentin completes today 8. Asthma: Monitor for dyspnea with activity. Continue symbicort and Flonase.  -encourage IS  -OOB as much as possible 9. HTN: Will monitor with bid checks. Continue cozaar, lasix, and bystolic.  LOS (Days) 1 A FACE TO FACE EVALUATION WAS PERFORMED  Bradshaw Minihan T 11/03/2012 8:28 AM

## 2012-11-03 NOTE — Progress Notes (Signed)
Physical Therapy Note  Patient Details  Name: Tracey Nguyen MRN: [id number] Date of Birth: 1940-09-15 Today's Date: 11/03/2012  Time: 1300-1330 30 minutes  No c/o pain.  W/c mobility in controlled environment with supervision 100' before fatigue, able to propel 100' x 2.  Obstacle negotiation with w/c with min A, pt with difficulty maneuvering chairs through small spaces due to arm length over arm rests, may benefit from smaller or custom chair.  Sit to stands and short distance gait training with close supervision-min A, cues for TDWB on R LE.  Individual therapy   Alexyia Guarino 11/03/2012, 1:32 PM

## 2012-11-03 NOTE — Evaluation (Addendum)
Physical Therapy Assessment and Plan  Patient Details  Name: Tracey Nguyen MRN: [id number] Date of Birth: 05-08-1941  PT Diagnosis: Abnormality of gait, Difficulty walking, Impaired cognition and Muscle weakness Rehab Potential: Good ELOS: 12-14 days   Today's Date: 11/03/2012 Time: 0900-1000 Time Calculation (min): 60 min  Problem List:  Patient Active Problem List  Diagnosis  . HYPERTHYROIDISM  . DEPRESSION  . PERIPHERAL NEUROPATHY  . ALLERGIC RHINITIS  . Moderate persistent asthma with allergic factors  . GERD  . TENDINITIS, TIBIALIS  . OSTEOPOROSIS  . CHEST DISCOMFORT  . HYPERTENSION NEC  . BREAST CANCER, HX OF  . COLONIC POLYPS, HX OF  . S/P removal of and implantation of new IM nail, right hip  . Acute blood loss anemia  . Obesity (BMI 30.0-34.9)  . Ethmoid sinusitis--recent flare    Past Medical History:  Past Medical History  Diagnosis Date  . Asthma   . Arthritis   . Cancer 2007    breast cancer  . GERD (gastroesophageal reflux disease)   . Hypertension   . Osteoporosis   . Sleep apnea     on cpap  . Blood transfusion   . Cataract     BILATERAL -REMOVED VIA SURGERY  . History of D&C   . Pneumonia     hx of  . Headache     hx of migraines  . Complication of anesthesia     hard to wake up in 1970   Past Surgical History:  Past Surgical History  Procedure Laterality Date  . Femur fracture surgery  2011    titanium rod/right  . Mastectomy  2007    right  . Hemorrhoidectomy  1987  . Breast biopsy  1995    left  . Cataract extraction, bilateral  2006  . Dilation and curettage of uterus    . Femur im nail Right 10/30/2012    Procedure: revision right femur INTRAMEDULLARY NAILING;  Surgeon: [name], MD;  Location: [facility];  Service: Orthopedics;  Laterality: Right;    Assessment & Plan Clinical Impression: Tracey Nguyen is a 72 y.o. 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 Charlann Boxer on 10/30/12. Post op with ABLA with hgb 7.4 and was transfused with 2 units PRBC. 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 Patient transferred to CIR on 11/02/2012 .   Patient currently requires min with mobility secondary to muscle weakness, impaired timing and sequencing and unbalanced muscle activation, decreased problem solving, decreased safety awareness, decreased memory and delayed processing and decreased standing balance, decreased balance strategies and difficulty maintaining precautions.  Prior to hospitalization, patient was modified independent  with mobility and lived with Spouse in a House home.  Home access is  Ramped entrance.  Patient will benefit from skilled PT intervention to maximize safe functional mobility, minimize fall risk and decrease caregiver burden for planned discharge home with intermittent assist.  Anticipate patient will benefit from follow up [facility] at discharge.  PT - End of Session Endurance Deficit: Yes PT Assessment Rehab Potential: Good Barriers to Discharge: Decreased caregiver support PT Plan PT Intensity: Minimum of 1-2 x/day ,45 to 90 minutes PT Frequency: 5 out of 7 days PT Duration Estimated Length of Stay: 12-14 days PT  Treatment/Interventions: Ambulation/gait training;Balance/vestibular training;Cognitive remediation/compensation;Community reintegration;Discharge planning;Neuromuscular re-education;Functional mobility training;DME/adaptive equipment instruction;Pain management;Patient/family education;Psychosocial support;UE/LE Strength taining/ROM;Therapeutic Exercise;Therapeutic Activities;Wheelchair propulsion/positioning;Stair training PT Recommendation Follow Up Recommendations: Home health PT Patient destination: Home Equipment Recommended:  Other (comment) (To be determined)  Skilled Therapeutic Intervention Worked on ambulation x 15' with RW, cues for weight bearing precautions and sequencing. Pt has decreased recall of cues. PT adjusted wheelchair fit, adjusted elevating leg rests. Pt required min/mod assist with bathroom mobility, mod verbal cues for sequencing. Pt slightly slow to process new information and cues.    PT Evaluation Precautions/Restrictions Precautions Precautions: Fall Restrictions Weight Bearing Restrictions: Yes RLE Weight Bearing: Touchdown weight bearing Other Position/Activity Restrictions: R LE General   Vital Signs  Pain Pain Assessment Pain Assessment: No/denies pain Pain Score: 0-No pain Pain Type: Acute pain Pain Location: Hip Pain Orientation: Right Pain Descriptors: Sore Pain Onset: With Activity Pain Intervention(s):  (declined calling for medication) Home Living/Prior Functioning Home Living Lives With: Spouse Available Help at Discharge: Family (husband available intermittently) Type of Home: House Home Access: Ramped entrance Home Layout: One level Bathroom Shower/Tub: Tub/shower unit;Walk-in shower Bathroom Toilet: Standard Bathroom Accessibility: Yes How Accessible: Accessible via walker Home Adaptive Equipment: Bedside commode/3-in-1;Tub transfer bench;Walker - rolling;Reacher Prior Function Level of Independence: Requires assistive device for independence;Independent with basic ADLs Bath: Minimal Able to Take Stairs?: Yes (only ~3-4 out in community with help) Driving: No Vocation: Unemployed Leisure: Hobbies-yes (Comment) Comments: Likes to read and sew Vision/Perception  Vision - History Baseline Vision: Wears glasses all the time Patient Visual Report: No change from baseline Vision - Assessment Eye Alignment: Within Functional Limits Perception Perception: Within Functional Limits Praxis Praxis: Intact  Cognition Overall Cognitive Status: Appears within  functional limits for tasks assessed Arousal/Alertness: Awake/alert Orientation Level: Oriented X4 Memory: Appears intact Awareness: Appears intact Problem Solving: Appears intact Safety/Judgment: Appears intact Sensation Sensation Light Touch: Appears Intact Proprioception: Appears Intact  Coordination Gross Motor Movements are Fluid and Coordinated: Yes Fine Motor Movements are Fluid and Coordinated: Yes  Mobility Transfers Sit to Stand: 4: Min assist Stand to Sit: 4: Min assist;3: Mod assist Stand to Sit Details: mod assist for one stand> sit secondary to poor RW placement, otherwise min assist Locomotion  Ambulation Ambulation/Gait Assistance: 4: Min assist Ambulation Distance (Feet): 15 Feet Assistive device: Rolling walker Ambulation/Gait Assistance Details: Cues for sequencing/positioning of RW, Cues for weight bearing precautions and posture.  Gait Gait Pattern: Step-to pattern;Decreased step length - right;Decreased step length - left;Decreased stance time - right Stairs / Additional Locomotion Stairs: No   Balance Static Sitting Balance Static Sitting - Balance Support: No upper extremity supported;Feet supported Static Sitting - Level of Assistance: 5: Stand by assistance Static Standing Balance Static Standing - Balance Support: Left upper extremity supported Static Standing - Level of Assistance: 5: Stand by assistance Extremity Assessment  RUE Assessment RUE Assessment: Within Functional Limits (can benefit from UE strengthening) LUE Assessment LUE Assessment: Within Functional Limits (can benefit from UE strengthening) RLE Assessment RLE Assessment: Exceptions to [facility] RLE AROM (degrees) RLE Overall AROM Comments: Decreased Rt. hip ROM functionally RLE Strength RLE Overall Strength Comments: Rt. hip not tested secondary to precautions however demonstrates functional weakness, otherwise pt 3+/5. Decreased muscular endurance noted. LLE Assessment LLE  Assessment: Exceptions to WFL LLE AROM (degrees) LLE Overall AROM Comments: WFL LLE Strength LLE Overall Strength Comments: 4-/5, decreased muscular endurance noted.  FIM:  FIM - Bed/Chair Transfer Bed/Chair Transfer: 4: Bed > Chair or W/C: Min A (steadying  Pt. > 75%);4: Chair or W/C > Bed: Min A (steadying Pt. > 75%) FIM - Locomotion: Wheelchair Locomotion: Wheelchair: 1: Travels less than 50 ft with moderate assistance (Pt: 50 - 74%) FIM - Locomotion: Ambulation Locomotion: Ambulation Assistive Devices: Designer, industrial/product Ambulation/Gait Assistance: 4: Min assist Locomotion: Ambulation: 1: Travels less than 50 ft with minimal assistance (Pt.>75%) FIM - Locomotion: Stairs Locomotion: Stairs: 0: Activity did not occur (could not perform safely today, TDWB and weakness)   Refer to Care Plan for Long Term Goals  Recommendations for other services: None  Discharge Criteria: Patient will be discharged from PT if patient refuses treatment 3 consecutive times without medical reason, if treatment goals not met, if there is a change in medical status, if patient makes no progress towards goals or if patient is discharged from hospital.  The above assessment, treatment plan, treatment alternatives and goals were discussed and mutually agreed upon: by patient  Wilhemina Bonito 11/03/2012, 12:19 PM

## 2012-11-04 ENCOUNTER — Inpatient Hospital Stay (HOSPITAL_COMMUNITY): Payer: Medicare Other

## 2012-11-04 ENCOUNTER — Inpatient Hospital Stay (HOSPITAL_COMMUNITY): Payer: Medicare Other | Admitting: Physical Therapy

## 2012-11-04 DIAGNOSIS — S72499B Other fracture of lower end of unspecified femur, initial encounter for open fracture type I or II: Secondary | ICD-10-CM

## 2012-11-04 NOTE — Progress Notes (Signed)
Occupational Therapy Note  Patient Details  Name: TAWYNA PELLOT MRN: [id number] Date of Birth: Aug 24, 1940 Today's Date: 11/04/2012  Time: 1300-1345 Pt c/o 1/10 pain in right hip; RN aware and repositioned Individual Therapy  Pt engaged in functional amb (10') with RW at min A/steady A to gather towels.  Pt also engaged in dynamic standing activities including reaching for objects above head level to the right and to the left before tossing them at a target.  Pt required min verbal cues to adhere to [facility] restrictions.  Pt propelled w/c approx 50' back to room.  Pt required multiple rest breaks during therapy and became SOB with ambulation.   [name] 11/04/2012, 1:45 PM

## 2012-11-04 NOTE — Progress Notes (Signed)
Subjective/Complaints:  She has minimal complaints of hip pain.  Objective: Vital Signs: Blood pressure 103/48, pulse 63, temperature 97.9 F (36.6 C), temperature source Oral, resp. rate 16, height 4\' 9"  (1.448 m), weight 161 lb 2.5 oz (73.1 kg), SpO2 98.00%.   Obese female nad Chest CTA CV- reg rate ABD- overweight, active BS Ext: trace edema Assessment/Plan: 1. Functional deficits secondary to failed right femur ORIF s/p revision  Medical Problem List and Plan:  1. DVT Prophylaxis/Anticoagulation: Pharmaceutical: Xarelto  2. Pain Management: prn ultram.  3. Mood: Pleasant and appropriate with good overall outlook.    4. Neuropsych: This patient is capable of making decisions on his/her own behalf.  5. ABLA: continue iron supplement. Recheck next week. Count stable 6. OSA: CPAP with oxygen when asleep.  7. Recent sinusitis: augmentin completed 8. Asthma: no sxs -OOB as much as possible 9. HTN: Will monitor with bid checks. Continue cozaar, lasix, and bystolic. BP Readings from Last 3 Encounters:  11/04/12 103/48  11/02/12 115/78  11/02/12 115/78     LOS (Days) 2 A FACE TO FACE EVALUATION WAS PERFORMED  SWORDS,BRUCE HENRY 11/04/2012 10:14 AM

## 2012-11-05 ENCOUNTER — Inpatient Hospital Stay (HOSPITAL_COMMUNITY): Payer: Medicare Other | Admitting: Physical Therapy

## 2012-11-05 ENCOUNTER — Inpatient Hospital Stay (HOSPITAL_COMMUNITY): Payer: Medicare Other | Admitting: Occupational Therapy

## 2012-11-05 ENCOUNTER — Other Ambulatory Visit: Payer: Self-pay | Admitting: Family Medicine

## 2012-11-05 NOTE — Progress Notes (Signed)
Physical Therapy Note  Patient Details  Name: Tracey Nguyen MRN: [id number] Date of Birth: March 10, 1941 Today's Date: 11/05/2012  1035 -1130 (55 minutes) individual Pain: 2/10 RT hip / premedicated Other: TDWB RT LE  Focus of treatment: Therapeutic exercises focused on bilateral LE AROM/strengthening; gait training/endurance Treatment: Transfers stand/turn RW SBA TDWB RT LE; sit to supine (mat) min assist RT LE; supine to sit using sheet to self assist RT LE min assist trunk; bilateral LE AROM/strengthening X 20 - ankle pumps, heel slides (AA on right with pt using sheet to self assist); SAQs, hip abduction ( pt using sheet to self assist RT LE); gait - 15 feet X 2 RW SBA. .   1400-1445 (45 minutes) individual Pain: 5/10 RT LE/ nurse notified /meds given. Focus of treatment: wc mobility training; therapeutic exercise focused on activity tolerance; bed mobility training Treatment: wc mobility training 75 feet SBA with increased time; Nustep Level 3 X 10 minutes LT LE/bilateral UEs only; Sit to supine (standard bed) using sheet to self assist RT LE min/mod assist LT LE /trunk; supine to sit min assist.   Kahner Yanik,JIM 11/05/2012, 11:18 AM

## 2012-11-05 NOTE — Progress Notes (Signed)
Occupational Therapy Session Note  Patient Details  Name: Tracey Nguyen MRN: [id number] Date of Birth: 02-08-1941  Today's Date: 11/05/2012 [id number] and [id number]  Time Calculation (min):102 minutes  Skilled Therapeutic Interventions/Progress Updates:                 AM session: [phone]      patient scheduled for ADL and required mod A and extra time for the supine to EOB transfer as she exhibited decreased trunk and R hip flexor strength.    Otherwise, she completed toileting her bathroom and bathing and dressig in her w/c at sink.                   Also focused on LE bathing and dressing and utilizing AE which patient needed a little extra time due to decreased strength and coordination in her wrists which she said resulted from Arthritis.  PM session [phone]  When asked how patient completed her LB b/d prior to admission she stated, "If any got done, I just did whatever I could do the best I could."  With this statement and records indicating that patient needs to be at Mod I level, it is important that this patient practice LB bathing and dressing.    This afternoon patient utilized sock aide, reacher, elastic laces and LH shoe horn to don socks and shoes.       Unfortunately, in order to don her right shoe onto her right foot, she exhibited greater than TDWB.  As a result this clinician finished donning the shoe for her.    Patient and clinician discussed that perhaps patient would only don tredded socks when home alone if still TDWB.  And, if she needs to go outside to ask the caregiver with her to don the shoe so that she will not break TDWB precaution.  Patient required extra time to utililze AE due to arthritic wrists which she stated were "weak" and appeared to slightly lack coordination.    Therapy Documentation Precautions:  Precautions Precautions: Fall Restrictions Weight Bearing Restrictions: Yes RLE Weight Bearing: Touchdown weight bearing Other  Position/Activity Restrictions: R LE  Pain: 5/10 upon any movement in right quadricep area  See FIM for current functional status  Therapy/Group: Individual Therapy  Bud Face [facility] 11/05/2012, 4:24 PM

## 2012-11-05 NOTE — Progress Notes (Signed)
Subjective/Complaints:  No pain this a.m  Objective: Vital Signs: Blood pressure 111/53, pulse 58, temperature 97.7 F (36.5 C), temperature source Oral, resp. rate 14, height 4\' 9"  (1.448 m), weight 161 lb 2.5 oz (73.1 kg), SpO2 92.00%.   Obese female nad Chest CTA CV- reg rate ABD- overweight, active BS Ext: trace edema Assessment/Plan: 1. Functional deficits secondary to failed right femur ORIF s/p revision  Medical Problem List and Plan:  1. DVT Prophylaxis/Anticoagulation: Pharmaceutical: Xarelto  2. Pain Management: prn ultram.  3. Mood: Pleasant and appropriate with good overall outlook.    4. Neuropsych: This patient is capable of making decisions on his/her own behalf.  5. ABLA: continue iron supplement. Recheck next week. Count stable 6. OSA: CPAP with oxygen when asleep. - she is compliant 7. Recent sinusitis: augmentin completed 8. Asthma: no sxs -OOB as much as possible 9. HTN: Will monitor with bid checks. Continue cozaar, lasix, and bystolic. (controlled) BP Readings from Last 3 Encounters:  11/05/12 111/53  11/02/12 115/78  11/02/12 115/78     LOS (Days) 3 A FACE TO FACE EVALUATION WAS PERFORMED  Tracey Nguyen 11/05/2012 8:43 AM

## 2012-11-06 ENCOUNTER — Inpatient Hospital Stay (HOSPITAL_COMMUNITY): Payer: Medicare Other | Admitting: Physical Therapy

## 2012-11-06 ENCOUNTER — Inpatient Hospital Stay (HOSPITAL_COMMUNITY): Payer: Medicare Other

## 2012-11-06 DIAGNOSIS — S72009A Fracture of unspecified part of neck of unspecified femur, initial encounter for closed fracture: Secondary | ICD-10-CM

## 2012-11-06 DIAGNOSIS — G4733 Obstructive sleep apnea (adult) (pediatric): Secondary | ICD-10-CM

## 2012-11-06 DIAGNOSIS — D62 Acute posthemorrhagic anemia: Secondary | ICD-10-CM

## 2012-11-06 LAB — BASIC METABOLIC PANEL
BUN: 23 mg/dL (ref 6–23)
CO2: 36 mEq/L — ABNORMAL HIGH (ref 19–32)
Calcium: 9.6 mg/dL (ref 8.4–10.5)
Chloride: 99 mEq/L (ref 96–112)
Creatinine, Ser: 1.35 mg/dL — ABNORMAL HIGH (ref 0.50–1.10)

## 2012-11-06 MED ORDER — HYDROCODONE-ACETAMINOPHEN 10-325 MG PO TABS
1.0000 | ORAL_TABLET | Freq: Two times a day (BID) | ORAL | Status: DC
  Administered 2012-11-06 – 2012-11-07 (×2): 1 via ORAL
  Filled 2012-11-06 (×2): qty 1

## 2012-11-06 NOTE — Progress Notes (Signed)
Physical Therapy Session Note  Patient Details  Name: Tracey Nguyen MRN: [id number] Date of Birth: 1941/07/07  Today's Date: 11/06/2012 Time: 4098-1191 Time Calculation (min): 45 min  Short Term Goals: Week 1:  PT Short Term Goal 1 (Week 1): Pt will ambulate x 50' with supervision and LRAD. PT Short Term Goal 2 (Week 1): Pt will transfer to variety of surfaces with supervision PT Short Term Goal 3 (Week 1): Pt will ambulation up/down ramp with supervision and LRAD.   Skilled Therapeutic Interventions/Progress Updates:    Ambulation to/from bathroom with RW and min-guard assist, cues for weight bearing precautions as pt appears that she may be taking too much weight onto Rt. Leg. Wheelchair propulsion x 50' with supervision, increased difficulty for pt secondary to wheelchair fit. Ramp x 2 with RW, min assist and cues for Rt. LE TDWB. Pt needed seated rest in between secondary to dyspnea 3/4. Wheelchair to bed transfer with min-guard assist, min assist sit > supine in bed using leg lifter (needs to continue to work on efficiency of movement), pt needs encouragement for independence.   SpO2 down to 85% HR up to 109 bpm on room air post ramp, up to 94% on room air with seated rest.  Therapy Documentation Precautions:  Precautions Precautions: Fall Restrictions Weight Bearing Restrictions: Yes RLE Weight Bearing: Touchdown weight bearing Other Position/Activity Restrictions: R LE Pain: Pain Assessment Pain Score:   3 Pain Type: Acute pain;Surgical pain Pain Location: Leg Pain Orientation: Right Pain Onset: On-going Pain Intervention(s):  (premedicated)  See FIM for current functional status  Therapy/Group: Individual Therapy  Wilhemina Bonito 11/06/2012, 3:34 PM

## 2012-11-06 NOTE — Progress Notes (Signed)
Physical Therapy Session Note  Patient Details  Name: Tracey Nguyen MRN: [id number] Date of Birth: 07/02/1941  Today's Date: 11/06/2012 Time: 0830-0926 Time Calculation (min): 56 min  Short Term Goals: Week 1:  PT Short Term Goal 1 (Week 1): Pt will ambulate x 50' with supervision and LRAD. PT Short Term Goal 2 (Week 1): Pt will transfer to variety of surfaces with supervision PT Short Term Goal 3 (Week 1): Pt will ambulation up/down ramp with supervision and LRAD.   Skilled Therapeutic Interventions/Progress Updates:    Bathroom mobility with RW and min-guard assist, cues for sequencing and safety Supervision for standing balance and cleaning self. Ambulation x 40', 35' with RW and cues for weightbearing, posture, and encouragement to increase distance. Pt limited by decreased endurance. Practiced wheelchair set up for transfer, parts management, transfer wheelchair <> bed all with supervision/min assist, pt requires mod verbal cues. Sit <> supine most difficult for patient at this point and she continues to require mod assist. Introduced leg lifter however pt still needs assistance. ? Pt may benefit from rails at home.  Pt will likely benefit from custom fit wheelchair secondary to body structure and current functional status. Pt will likely need to spend most of her time in a wheelchair when her husband  Is at work to decrease risk of falls.  Discussed with pt and Child psychotherapist.   Cues for Rt. LE TDWB precautions needed throughout session, increased difficulty maintaining with fatigue.  Therapy Documentation Precautions:  Precautions Precautions: Fall Restrictions Weight Bearing Restrictions: Yes RLE Weight Bearing: Touchdown weight bearing Other Position/Activity Restrictions: R LE Pain: Pain Assessment Pain Assessment: 0-10 Pain Score:   4 Pain Type: acute pain Pain Location: knee Pain Orientation: Right Pain Descriptors: Aching Pain Onset: On-going RN made aware, in  room Multiple Pain Sites: Yes 2nd Pain Site Pain Score: 3 Pain Type: Surgical pain Pain Location: Hip Pain Orientation: Right Pain Descriptors: Aching Pain Frequency: Occasional Pain Intervention(s): RN made aware  See FIM for current functional status  Therapy/Group: Individual Therapy  Wilhemina Bonito 11/06/2012, 12:11 PM

## 2012-11-06 NOTE — Plan of Care (Signed)
Problem: RH PAIN MANAGEMENT Goal: RH STG PAIN MANAGED AT OR BELOW PT'S PAIN GOAL < than 3  Outcome: Not Progressing Continue to use tramadol and scheduled norco BID to manage pain in hip. Pt notes movement causes pain/discomfort registers ~5  Problem: RH SKIN INTEGRITY Goal: RH STG ABLE TO PERFORM INCISION/WOUND CARE W/ASSISTANCE STG Able To Perform Incision/Wound Care With MOD I Assistance.  Outcome: Not Progressing Continue to note small amount of drainage from hip incision; mepilex dressing changed every three days unless otherwise instructed. Staff doing, pt cannot reach or see incision at present to change dressing

## 2012-11-06 NOTE — Progress Notes (Signed)
RN placed patient on cpap with 2lpm 02 bleed in. Patient is tolerating cpap well at this time. RT will continue to monitor.

## 2012-11-06 NOTE — Progress Notes (Signed)
Occupational Therapy Session Note  Patient Details  Name: Tracey Nguyen MRN: [id number] Date of Birth: 11/06/1940  Today's Date: 11/06/2012 Time: 1000-1100 Time Calculation (min): 60 min  Short Term Goals: Week 1:  OT Short Term Goal 1 (Week 1): Short Term Goals = Long Term Goals secondary to ELOS  Skilled Therapeutic Interventions/Progress Updates:    Pt engaged in bathing and dressing sit<>stand w/c level at sink.  Pt required extra time to complete all tasks.  Pt uses AE (reacher, long handle sponge, and sock aide) appropriately to assist with tasks.  Pt required assistance for LB dressing.  Pt demonstrates difficulty maintaining RLE TDWB and requires min verbal cues to adhere to precautions. Focus on activity tolerance, sit<>stand, dynamic standing balance, and safety awareness.  Therapy Documentation Precautions:  Precautions Precautions: Fall Restrictions Weight Bearing Restrictions: Yes RLE Weight Bearing: Touchdown weight bearing Other Position/Activity Restrictions: R LE GPain: Pain Assessment Pain Assessment: 0-10 Pain Score:   5 Pain Type: Surgical pain Pain Location: Hip Pain Orientation: Right Pain Descriptors: Aching Pain Onset: On-going Patients Stated Pain Goal: 2 Pain Intervention(s): RN aware; repositioned  See FIM for current functional status  Therapy/Group: Individual Therapy  [name] 11/06/2012, 11:02 AM

## 2012-11-06 NOTE — Progress Notes (Signed)
Subjective/Complaints: Had a lot of pain in right hip yesterday into the evening. Feeling  Better this am A 12 point review of systems has been performed and if not noted above is otherwise negative.   Objective: Vital Signs: Blood pressure 110/64, pulse 63, temperature 98.3 F (36.8 C), temperature source Oral, resp. rate 18, height 4\' 9"  (1.448 m), weight 73.1 kg (161 lb 2.5 oz), SpO2 97.00%. No results found. No results found for this basename: WBC, HGB, HCT, PLT,  in the last 72 hours No results found for this basename: NA, K, CL, CO, GLUCOSE, BUN, CREATININE, CALCIUM,  in the last 72 hours CBG (last 3)  No results found for this basename: GLUCAP,  in the last 72 hours  Wt Readings from Last 3 Encounters:  11/02/12 73.1 kg (161 lb 2.5 oz)  10/30/12 72.576 kg (160 lb)  10/30/12 72.576 kg (160 lb)    Physical Exam:  Constitutional: She is oriented to person, place, and time. She appears well-developed and well-nourished.  Morbidly obese  HENT:  Head: Normocephalic and atraumatic.  Eyes: Pupils are equal, round, and reactive to light.  Neck: Normal range of motion.  Cardiovascular: Normal rate and regular rhythm.  Pulmonary/Chest: Effort normal. Chest clear. No rales or rhonchi Abdominal: Bowel sounds are normal. Non distended. Non tender Musculoskeletal: She exhibits edema (mild Right hip/thigh edema.  ).  Neurological: She is alert and oriented to person, place, and time. Strength 4/5 UE. Lower ext 1+ RHF, 2RKE, 4 ankle. LLE is 3 to 4+/5. No sensory deficits, DTR's 1+ Skin: Skin is warm and dry other than right hip wound which has mild serosanginous drainage. Wound is well approximated.   Assessment/Plan: 1. Functional deficits secondary to failed right femur ORIF s/p revision which require 3+ hours per day of interdisciplinary therapy in a comprehensive inpatient rehab setting. Physiatrist is providing close team supervision and 24 hour management of active medical problems  listed below. Physiatrist and rehab team continue to assess barriers to discharge/monitor patient progress toward functional and medical goals. FIM: FIM - Bathing Bathing Steps Patient Completed: Chest;Right Arm;Left Arm;Abdomen;Front perineal area Bathing: 4: Min-Patient completes 8-9 48f 10 parts or 75+ percent  FIM - Upper Body Dressing/Undressing Upper body dressing/undressing steps patient completed: Thread/unthread right bra strap;Thread/unthread left bra strap;Thread/unthread right sleeve of pullover shirt/dresss;Thread/unthread left sleeve of pullover shirt/dress;Put head through opening of pull over shirt/dress;Pull shirt over trunk Upper body dressing/undressing: 4: Min-Patient completed 75 plus % of tasks FIM - Lower Body Dressing/Undressing Lower body dressing/undressing steps patient completed: Thread/unthread left underwear leg;Thread/unthread right underwear leg;Pull underwear up/down;Thread/unthread left pants leg;Pull pants up/down Lower body dressing/undressing: 3: Mod-Patient completed 50-74% of tasks  FIM - Toileting Toileting steps completed by patient: Adjust clothing prior to toileting;Performs perineal hygiene;Adjust clothing after toileting Toileting Assistive Devices: Grab bar or rail for support Toileting: 4: Steadying assist  FIM - Diplomatic Services operational officer Devices: Grab bars Toilet Transfers: 4-To toilet/BSC: Min A (steadying Pt. > 75%);4-From toilet/BSC: Min A (steadying Pt. > 75%)  FIM - Bed/Chair Transfer Bed/Chair Transfer Assistive Devices: Bed rails (uses walker) Bed/Chair Transfer: 4: Supine > Sit: Min A (steadying Pt. > 75%/lift 1 leg);4: Bed > Chair or W/C: Min A (steadying Pt. > 75%)  FIM - Locomotion: Wheelchair Locomotion: Wheelchair: 1: Travels less than 50 ft with moderate assistance (Pt: 50 - 74%) FIM - Locomotion: Ambulation Locomotion: Ambulation Assistive Devices: Other (comment) (uses walker) Ambulation/Gait Assistance:  4: Min assist Locomotion: Ambulation: 1: Travels less than  50 ft with minimal assistance (Pt.>75%)  Comprehension Comprehension Mode: Auditory Comprehension: 7-Follows complex conversation/direction: With no assist  Expression Expression Mode: Verbal Expression: 7-Expresses complex ideas: With no assist  Social Interaction Social Interaction: 7-Interacts appropriately with others - No medications needed.  Problem Solving Problem Solving: 6-Solves complex problems: With extra time  Memory Memory: 6-More than reasonable amt of time  Medical Problem List and Plan:  1. DVT Prophylaxis/Anticoagulation: Pharmaceutical: Xarelto  2. Pain Management: prn ultram. celebrex reduced to qday.   -will schedule hydrocodone for better control-observe for any signs of sedation  -muscle relaxant 3. Mood: Pleasant and appropriate with good overall outlook.    4. Neuropsych: This patient is capable of making decisions on his/her own behalf.  5. ABLA: continue iron supplement. Recheck next week. Count stable 6. OSA: CPAP with oxygen when asleep.  7. Recent sinusitis: augmentin completes today 8. Asthma: Monitor for dyspnea with activity. Continue symbicort and Flonase.  -encourage IS  -OOB as much as possible 9. HTN: Will monitor with bid checks. Continue cozaar, lasix, and bystolic.  LOS (Days) 4 A FACE TO FACE EVALUATION WAS PERFORMED  Clarke Peretz T 11/06/2012 7:47 AM

## 2012-11-06 NOTE — Progress Notes (Signed)
Occupational Therapy Note  Patient Details  Name: Tracey Nguyen MRN: [id number] Date of Birth: 22-Mar-1941 Today's Date: 11/06/2012  Time: 11:30-12pm ( .) Pt seen for 1:1 OT session focusing on transfers, functional mobility and standing dynamic balance/tolerance. Pt sitting in wheelchair upon arrival, stating her pain was 2-4/10 (and she had just had a pain pill.) Pt propelled herself in wheelchair about 31ft before c/o arthritis pain. Pt participated in standing dynamic balance activity standing in kitchen retrieving items from cabinet. Pt could stand about 3 min before needing to sit x 2.Pt adhering to precautions while standing and transferring. Transferred onto couch also with therapist suggested she put pillow under couch cushion at home to possibly add height for ease of standing.    Gregoire Bennis Hessie Diener 11/06/2012, 12:09 PM

## 2012-11-07 ENCOUNTER — Inpatient Hospital Stay (HOSPITAL_COMMUNITY): Payer: Medicare Other

## 2012-11-07 ENCOUNTER — Inpatient Hospital Stay (HOSPITAL_COMMUNITY): Payer: Medicare Other | Admitting: Physical Therapy

## 2012-11-07 ENCOUNTER — Encounter (HOSPITAL_COMMUNITY): Payer: Medicare Other

## 2012-11-07 ENCOUNTER — Other Ambulatory Visit (HOSPITAL_COMMUNITY): Payer: Medicare Other

## 2012-11-07 ENCOUNTER — Inpatient Hospital Stay (HOSPITAL_COMMUNITY): Payer: Medicare Other | Admitting: *Deleted

## 2012-11-07 DIAGNOSIS — W19XXXA Unspecified fall, initial encounter: Secondary | ICD-10-CM

## 2012-11-07 DIAGNOSIS — D62 Acute posthemorrhagic anemia: Secondary | ICD-10-CM

## 2012-11-07 DIAGNOSIS — G4733 Obstructive sleep apnea (adult) (pediatric): Secondary | ICD-10-CM

## 2012-11-07 DIAGNOSIS — S72009A Fracture of unspecified part of neck of unspecified femur, initial encounter for closed fracture: Secondary | ICD-10-CM

## 2012-11-07 LAB — BASIC METABOLIC PANEL
BUN: 35 mg/dL — ABNORMAL HIGH (ref 6–23)
CO2: 35 mEq/L — ABNORMAL HIGH (ref 19–32)
Calcium: 9.2 mg/dL (ref 8.4–10.5)
Creatinine, Ser: 1.81 mg/dL — ABNORMAL HIGH (ref 0.50–1.10)
GFR calc non Af Amer: 27 mL/min — ABNORMAL LOW (ref >90)
Glucose, Bld: 123 mg/dL — ABNORMAL HIGH (ref 70–99)
Sodium: 137 mEq/L (ref 135–145)

## 2012-11-07 LAB — URINALYSIS, ROUTINE W REFLEX MICROSCOPIC
Specific Gravity, Urine: 1.018 (ref 1.005–1.030)
Urobilinogen, UA: 0.2 mg/dL (ref 0.0–1.0)

## 2012-11-07 LAB — CBC
MCH: 21.5 pg — ABNORMAL LOW (ref 26.0–34.0)
MCHC: 31.6 g/dL (ref 30.0–36.0)
MCV: 67.9 fL — ABNORMAL LOW (ref 78.0–100.0)
Platelets: 236 10*3/uL (ref 150–400)
RDW: 17.9 % — ABNORMAL HIGH (ref 11.5–15.5)

## 2012-11-07 LAB — URINE MICROSCOPIC-ADD ON

## 2012-11-07 MED ORDER — LOSARTAN POTASSIUM 50 MG PO TABS
50.0000 mg | ORAL_TABLET | Freq: Every day | ORAL | Status: DC
  Administered 2012-11-09 – 2012-11-15 (×7): 50 mg via ORAL
  Filled 2012-11-07 (×10): qty 1

## 2012-11-07 MED ORDER — ACETAMINOPHEN 325 MG PO TABS
650.0000 mg | ORAL_TABLET | Freq: Once | ORAL | Status: AC
  Administered 2012-11-07: 650 mg via ORAL
  Filled 2012-11-07: qty 2

## 2012-11-07 MED ORDER — SODIUM CHLORIDE 0.9 % IV BOLUS (SEPSIS)
500.0000 mL | Freq: Once | INTRAVENOUS | Status: AC
  Administered 2012-11-07: 500 mL via INTRAVENOUS

## 2012-11-07 MED ORDER — FUROSEMIDE 20 MG PO TABS: 20.0000 mg | ORAL_TABLET | Freq: Every day | ORAL | Status: DC

## 2012-11-07 MED ORDER — FUROSEMIDE 10 MG/ML IJ SOLN
20.0000 mg | Freq: Once | INTRAMUSCULAR | Status: AC
  Administered 2012-11-07: 20 mg via INTRAVENOUS
  Filled 2012-11-07: qty 2

## 2012-11-07 MED ORDER — DIPHENHYDRAMINE HCL 25 MG PO CAPS
25.0000 mg | ORAL_CAPSULE | Freq: Once | ORAL | Status: AC
  Administered 2012-11-07: 25 mg via ORAL
  Filled 2012-11-07: qty 1

## 2012-11-07 MED ORDER — ENOXAPARIN SODIUM 30 MG/0.3ML ~~LOC~~ SOLN: 30.0000 mg | SUBCUTANEOUS | Status: DC

## 2012-11-07 MED ORDER — TRAMADOL HCL 50 MG PO TABS
50.0000 mg | ORAL_TABLET | Freq: Four times a day (QID) | ORAL | Status: DC | PRN
  Administered 2012-11-07 – 2012-11-16 (×16): 50 mg via ORAL
  Filled 2012-11-07 (×16): qty 1

## 2012-11-07 MED ORDER — SODIUM CHLORIDE 0.9 % IV SOLN
INTRAVENOUS | Status: AC
  Filled 2012-11-07: qty 1000

## 2012-11-07 NOTE — Progress Notes (Signed)
Physical Therapy Session Note  Patient Details  Name: Tracey Nguyen MRN: [id number] Date of Birth: Aug 16, 1940  Today's Date: 11/07/2012 Time: 1500-1520 Time Calculation (min): 20 min Patient missed 10 minutes of therapy secondary to being taken off unit for CT scan  Short Term Goals: Week 1:  PT Short Term Goal 1 (Week 1): Pt will ambulate x 50' with supervision and LRAD. PT Short Term Goal 2 (Week 1): Pt will transfer to variety of surfaces with supervision PT Short Term Goal 3 (Week 1): Pt will ambulation up/down ramp with supervision and LRAD.   Skilled Therapeutic Interventions/Progress Updates:    Patient received supine in bed with HOB elevated. Per MD orders, bed level therapy only this PM. In supine with HOB elevated, patient instructed in B LE there ex: quad sets with 5" hold, glut sets with 5" hold, ankle pumps, heel slides, hip abd; all performed 2 sets x10 reps. Patient requires AAROM in limited range of motion with R LE.  SpO2 checked throughout session and remained above 94% entire session on 1 L O2 via [place]. Patient missed last 10 minutes of therapy secondary to being taken off unit for CT scan.  Therapy Documentation Precautions:  Precautions Precautions: Fall Restrictions Weight Bearing Restrictions: Yes RLE Weight Bearing: Touchdown weight bearing Other Position/Activity Restrictions: R LE Vital Signs: Therapy Vitals Pulse Rate: 75 Patient Position, if appropriate: Lying Oxygen Therapy SpO2: 95 % O2 Device: Nasal cannula O2 Flow Rate (L/min): 1 L/min Pulse Oximetry Type: Intermittent Pain: Pain Assessment Pain Assessment: No/denies pain Pain Score: 0-No pain  See FIM for current functional status  Therapy/Group: Individual Therapy  Chipper Herb. Taesha Goodell, PT, DPT  11/07/2012, 3:27 PM

## 2012-11-07 NOTE — Progress Notes (Signed)
Physical therapist reports SpO2=86% with bed during therapy; RN checked SpO2 while sitting = 88-89%. Notified Pam Love, PA-C and ordered to administer PRN Combivent. Occupational therapy checked SpO2 again before ADL's = 77%-84%; patient reported feeling "shaky and light headed" BP checked and was 81/49. PA-C ordered CXR, CBC and Chem labs, and continue to monitor BP, patient returned to bed. 15 minutes later, recheck BP=78/50, PA-C assessed patient at bedside, ordered to start PIV, NS bolus, and NS continous infusion at 75cc/hr. Patient visibly lethargic, but oriented and stable. Scheduled Norco discontinued and BP meds parameter changed to Hold if SBP<120. Patient stable and resting. Will continue to monitor BP q48m until SBP>100.

## 2012-11-07 NOTE — Patient Care Conference (Signed)
Inpatient RehabilitationTeam Conference and Plan of Care Update Date: 11/07/2012   Time: 2:15 PM    Patient Name: Tracey Nguyen      Medical Record Number: [id number]  Date of Birth: 04-22-1941 Sex: Female         Room/Bed: 4038/4038-01 Payor Info: Payor: MEDICARE  Plan: MEDICARE PART B  Product Type: *No Product type*     Admitting Diagnosis: hip revision  Admit Date/Time:  11/02/2012  3:12 PM Admission Comments: No comment available   Primary Diagnosis:  Complication internal fixation device such as nail, plate, or rod Principal Problem: Complication internal fixation device such as nail, plate, or rod  Patient Active Problem List   Diagnosis Date Noted  . Ethmoid sinusitis--recent flare 11/03/2012  . Acute blood loss anemia 10/31/2012  . Obesity (BMI 30.0-34.9) 10/31/2012  . S/P removal of and implantation of new IM nail, right hip 10/30/2012  . CHEST DISCOMFORT 04/24/2009  . PERIPHERAL NEUROPATHY 09/17/2008  . HYPERTHYROIDISM 08/17/2007  . DEPRESSION 08/17/2007  . ALLERGIC RHINITIS 08/17/2007  . Moderate persistent asthma with allergic factors 08/17/2007  . GERD 08/17/2007  . OSTEOPOROSIS 08/17/2007  . HYPERTENSION NEC 08/17/2007  . BREAST CANCER, HX OF 08/17/2007  . COLONIC POLYPS, HX OF 08/17/2007  . TENDINITIS, TIBIALIS 05/15/2007    Expected Discharge Date: Expected Discharge Date: 11/14/12  Team Members Present: Physician leading conference: Dr. Faith Rogue Social Worker Present: Amada Jupiter, LCSW Nurse Present: Other (comment) Derrill Kay, RN) PT Present: Karolee Stamps, PT OT Present: Mackie Pai, OT;Patricia Mat Carne, OT Other (Discipline and Name): Bayard Hugger, RN and Tora Duck, PPS Coordinator     Current Status/Progress Goal Weekly Team Focus  Medical   revision of hip orif. anemic, ?hematoma  pain control, increase stamina  follow blood counts, dx source of bleeds   Bowel/Bladder   pt continent of B&B  will remain continent B&B  will  remain continent B&B   Swallow/Nutrition/ Hydration             ADL's   supervison UB bathing and dressing; steady A LB bathing; min A LB dressing; min A transfers; mod A tub transfer  mod I overall; supervision for tub transfers  activity tolerance; dynamic standing; transfers   Mobility   min assist/supervision, limited by decreased endurance  supervision gait, modified independent with transfers  safety with wheelchair setup and transfers, bed mobility, increased functional activity, standing balance, maintaining precautions   Communication             Safety/Cognition/ Behavioral Observations            Pain   pt c/o pain 5/10  R hip s/p surgery  pain <3  assess and medicate for pain. Norco 1 tab 10-325mg  po BID scheduled; Norco 1 tab 10-325mg  po Q4h prn for severe pain and Tramadol 50-100mg  Q6h po for moderate pian   Skin   R hip Mepliex dressing dry and intact; R knee allevyn dressing clean dry and intact; skin free from breakdown/infection  skin will remain free from skin breakdwom/infection  Assess and monitor  skin integrity Qshift and prn     Rehab Goals Patient on target to meet rehab goals:  (guarded - concern over decreasing O2 sats - may affect goals) *See Care Plan and progress notes for long and short-term goals.  Barriers to Discharge: poor carry over, pain mgt, anemia , pain control    Possible Resolutions to Barriers:  made need supervision at home    Discharge Planning/Teaching  Needs:  Plan currently is for pt to d/c home with her husband who can only provide intermittent assistance -       Team Discussion:  O2 sat issues today and needing more verbal cueing.  MD to check labs.  Difficulty maintaining WB precautions and following directions.  Concern that she may not reach initial goals - will monitor over next few days.  Revisions to Treatment Plan:  Not at this time.   Continued Need for Acute Rehabilitation Level of Care: The patient requires daily  medical management by a physician with specialized training in physical medicine and rehabilitation for the following conditions: Daily direction of a multidisciplinary physical rehabilitation program to ensure safe treatment while eliciting the highest outcome that is of practical value to the patient.: Yes Daily medical management of patient stability for increased activity during participation in an intensive rehabilitation regime.: Yes Daily analysis of laboratory values and/or radiology reports with any subsequent need for medication adjustment of medical intervention for : Other;Neurological problems;Pulmonary problems (anemia, hematoma)  Leonila Speranza 11/07/2012, 4:46 PM

## 2012-11-07 NOTE — Progress Notes (Signed)
Pt orthostatic VS= lying B/P 86/53; sitting B/P=116/65; standing B/P 122/74; Pam Love notified; Hold Cozaar and Lasix if Systolic B/P<100. Will continue to monitor pt

## 2012-11-07 NOTE — Progress Notes (Addendum)
Patient placed on bedrest due to bleeding in right thigh. xarelto discontinued and SCDs added. Patient informed of results. Dr. Nilsa Nutting office contacted to update on current issue and for any further orders. Was informed that he's in surgery currently and they will have him contact me once out of procedure.   CT reviewed. Blood transfusion ordered. Pt currently stable. All anticoagulation stopped. Pt on bedrest for now. Recheck labs in the AM. Ortho contacted.   ZTS

## 2012-11-07 NOTE — Progress Notes (Signed)
Occupational Therapy Note  Patient Details  Name: Tracey Nguyen MRN: [id number] Date of Birth: [birth year] Today's Date: 11/07/2012  Pt missed 60 mins skilled OT services.  Pt O2 sats at 72% upon arrival.  Nursing notified and attended to patient.  Pt returned to bed per PA.  [name] [facility] 11/07/2012, 10:30 AM

## 2012-11-07 NOTE — Progress Notes (Signed)
Physical Therapy Session Note  Patient Details  Name: Tracey Nguyen MRN: [id number] Date of Birth: [birth year]  Today's Date: 11/07/2012 Time: 0830-0928 Time Calculation (min): 58 min  Short Term Goals: Week 1:  PT Short Term Goal 1 (Week 1): Pt will ambulate x 50' with supervision and LRAD. PT Short Term Goal 2 (Week 1): Pt will transfer to variety of surfaces with supervision PT Short Term Goal 3 (Week 1): Pt will ambulation up/down ramp with supervision and LRAD.   Skilled Therapeutic Interventions/Progress Updates:   Standing transfer wheelchair <> bed with RW, close supervision. Cues needed to lock brakes and maintain weight bearing precautions (pt still struggling with precautions). Sit <> supine using leg lifter with min assist, very fatiguing for pt and she must take multiple rest breaks. Worked to problem solve through transfer to increase pt's independence. In supine pt practicing on Rt. LE hip abduction 3 x 10 reps, heel slides 2 x 10 reps, intermittant assist needed secondary to weakness. Encouraged pt to perform during commercial breaks when in bed of evenings. Car transfer with min assist for Rt. LE, pt utilized leg lifter to assist leg into car. Cues for sequencing and efficiency, pt unable to scoot back into back seat and has to perform transfer to front passenger seat. Multiple rest breaks needed.  BP 97/60. SpO2 down to 85% again post mobility, able to increase to 95% with seated rest all on room air. RN made aware.  Therapy Documentation Precautions:  Precautions Precautions: Fall Restrictions Weight Bearing Restrictions: Yes RLE Weight Bearing: Touchdown weight bearing Other Position/Activity Restrictions: R LE Pain: Pain Assessment Pain Assessment: No/denies pain See FIM for current functional status  Therapy/Group: Individual Therapy  Sherrine Maples Cheek 11/07/2012, 12:00 PM

## 2012-11-07 NOTE — Progress Notes (Signed)
Subjective/Complaints: Pain in right hip better. Still generally sore A 12 point review of systems has been performed and if not noted above is otherwise negative.   Objective: Vital Signs: Blood pressure 122/74, pulse 83, temperature 97.5 F (36.4 C), temperature source Oral, resp. rate 18, height 4\' 9"  (1.448 m), weight 73.1 kg (161 lb 2.5 oz), SpO2 94.00%. No results found. No results found for this basename: WBC, HGB, HCT, PLT,  in the last 72 hours  Recent Labs  11/06/12 0702  NA 141  K 3.9  CL 99  GLUCOSE 117*  BUN 23  CREATININE 1.35*  CALCIUM 9.6   CBG (last 3)  No results found for this basename: GLUCAP,  in the last 72 hours  Wt Readings from Last 3 Encounters:  11/02/12 73.1 kg (161 lb 2.5 oz)  10/30/12 72.576 kg (160 lb)  10/30/12 72.576 kg (160 lb)    Physical Exam:  Constitutional: She is oriented to person, place, and time. She appears well-developed and well-nourished.  Morbidly obese  HENT:  Head: Normocephalic and atraumatic.  Eyes: Pupils are equal, round, and reactive to light.  Neck: Normal range of motion.  Cardiovascular: Normal rate and regular rhythm.  Pulmonary/Chest: Effort normal. Chest clear. No rales or rhonchi Abdominal: Bowel sounds are normal. Non distended. Non tender Musculoskeletal: She exhibits edema (mild Right hip/thigh edema.  ).  Neurological: She is alert and oriented to person, place, and time. Strength 4/5 UE. Lower ext 1+ RHF, 2RKE, 4 ankle. LLE is 3 to 4+/5. No sensory deficits, DTR's 1+ Skin: Skin is warm and dry other than right hip wound which has mild serosanginous drainage. Wound is well approximated.   Assessment/Plan: 1. Functional deficits secondary to failed right femur ORIF s/p revision which require 3+ hours per day of interdisciplinary therapy in a comprehensive inpatient rehab setting. Physiatrist is providing close team supervision and 24 hour management of active medical problems listed below. Physiatrist  and rehab team continue to assess barriers to discharge/monitor patient progress toward functional and medical goals. FIM: FIM - Bathing Bathing Steps Patient Completed: Chest;Right Arm;Left Arm;Abdomen;Front perineal area Bathing: 4: Min-Patient completes 8-9 73f 10 parts or 75+ percent  FIM - Upper Body Dressing/Undressing Upper body dressing/undressing steps patient completed: Thread/unthread right bra strap;Thread/unthread left bra strap;Thread/unthread right sleeve of pullover shirt/dresss;Thread/unthread left sleeve of pullover shirt/dress;Put head through opening of pull over shirt/dress;Pull shirt over trunk Upper body dressing/undressing: 4: Min-Patient completed 75 plus % of tasks FIM - Lower Body Dressing/Undressing Lower body dressing/undressing steps patient completed: Thread/unthread left underwear leg;Thread/unthread right underwear leg;Pull underwear up/down;Thread/unthread left pants leg;Pull pants up/down Lower body dressing/undressing: 3: Mod-Patient completed 50-74% of tasks  FIM - Toileting Toileting steps completed by patient: Adjust clothing prior to toileting;Performs perineal hygiene;Adjust clothing after toileting Toileting Assistive Devices: Grab bar or rail for support Toileting: 4: Steadying assist  FIM - Diplomatic Services operational officer Devices: Grab bars Toilet Transfers: 4-To toilet/BSC: Min A (steadying Pt. > 75%);4-From toilet/BSC: Min A (steadying Pt. > 75%)  FIM - Bed/Chair Transfer Bed/Chair Transfer Assistive Devices: Bed rails (uses walker) Bed/Chair Transfer: 4: Sit > Supine: Min A (steadying pt. > 75%/lift 1 leg);4: Bed > Chair or W/C: Min A (steadying Pt. > 75%);4: Chair or W/C > Bed: Min A (steadying Pt. > 75%)  FIM - Locomotion: Wheelchair Locomotion: Wheelchair: 2: Travels 50 - 149 ft with supervision, cueing or coaxing FIM - Locomotion: Ambulation Locomotion: Ambulation Assistive Devices: Designer, industrial/product Ambulation/Gait  Assistance: 4:  Min assist Locomotion: Ambulation: 1: Travels less than 50 ft with minimal assistance (Pt.>75%)  Comprehension Comprehension Mode: Auditory Comprehension: 6-Follows complex conversation/direction: With extra time/assistive device  Expression Expression Mode: Verbal Expression: 6-Expresses complex ideas: With extra time/assistive device  Social Interaction Social Interaction: 6-Interacts appropriately with others with medication or extra time (anti-anxiety, antidepressant).  Problem Solving Problem Solving: 6-Solves complex problems: With extra time  Memory Memory: 6-More than reasonable amt of time  Medical Problem List and Plan:  1. DVT Prophylaxis/Anticoagulation: Pharmaceutical: Xarelto  2. Pain Management: prn ultram. celebrex reduced to qday.   - scheduled hydrocodone for better control-   -muscle relaxant 3. Mood: Pleasant and appropriate with good overall outlook.    4. Neuropsych: This patient is capable of making decisions on his/her own behalf.  5. ABLA: continue iron supplement. Recheck Thursday. 9.8 on friday 6. OSA: CPAP with oxygen when asleep.  7. Recent sinusitis: augmentin completed 8. Asthma: Monitor for dyspnea with activity. Continue symbicort and Flonase.  -encourage IS  -OOB as much as possible 9. HTN: Will monitor with bid checks. Continue cozaar, lasix, and bystolic.  -prn hold parameters on meds for orthostasis  -encourage adequate fluids  LOS (Days) 5 A FACE TO FACE EVALUATION WAS PERFORMED  Sharna Gabrys T 11/07/2012 8:06 AM

## 2012-11-07 NOTE — Progress Notes (Signed)
Sitting BP this AM 110/54, HR=79, asymptomatic. [name], PA notified - Nebivolol 5mg  administered. New parameters placed, hold BP meds if sitting SBP is <100. Patient stable and with therapy, will continue to monitor.

## 2012-11-07 NOTE — Progress Notes (Signed)
Patient developed hypoxia with hypotension this am. Labs done show evidence of renal insufficiency with recurrent anemia. Will decrease lasix to 20 mg and hold for a few days. Will discontinue scheduled norco and use only prn severe pain.  Bolus and start IVF for hydration. Check stool guaiacs to monitor for signs of bleeding. Wound clean and dry and thigh is soft. Abdomen non-tender. Will recheck CBC in am. BP is improving past bolus. Check UA due to increase in WBC. Will check CT of abdomen and thigh to rule out hemorrhage.

## 2012-11-07 NOTE — Progress Notes (Signed)
Occupational Therapy Note  Patient Details  Name: Tracey Nguyen MRN: [id number] Date of Birth: [birth year] Today's Date: 11/07/2012  Time: 1400-1415 Pt denies pain Individual Therapy  Pt on restrictions - bed level therapy only.  Pt stated she is still very weak but agreed to engage in BUE therex at bed level.  After approx 15 mins pt stated she was too tired to continue.  Educated patient on exercises she could complete on her own while in bed and w/c.   [name] [facility] 11/07/2012, 2:24 PM

## 2012-11-08 ENCOUNTER — Inpatient Hospital Stay (HOSPITAL_COMMUNITY): Payer: Medicare Other | Admitting: Physical Therapy

## 2012-11-08 ENCOUNTER — Inpatient Hospital Stay (HOSPITAL_COMMUNITY): Payer: Medicare Other

## 2012-11-08 DIAGNOSIS — D62 Acute posthemorrhagic anemia: Secondary | ICD-10-CM

## 2012-11-08 DIAGNOSIS — G4733 Obstructive sleep apnea (adult) (pediatric): Secondary | ICD-10-CM

## 2012-11-08 DIAGNOSIS — S72009A Fracture of unspecified part of neck of unspecified femur, initial encounter for closed fracture: Secondary | ICD-10-CM

## 2012-11-08 LAB — CBC
HCT: 28.2 % — ABNORMAL LOW (ref 36.0–46.0)
Hemoglobin: 9.2 g/dL — ABNORMAL LOW (ref 12.0–15.0)
MCH: 23.2 pg — ABNORMAL LOW (ref 26.0–34.0)
MCHC: 32.6 g/dL (ref 30.0–36.0)
MCV: 71.2 fL — ABNORMAL LOW (ref 78.0–100.0)

## 2012-11-08 LAB — TYPE AND SCREEN: Unit division: 0

## 2012-11-08 LAB — BASIC METABOLIC PANEL
BUN: 27 mg/dL — ABNORMAL HIGH (ref 6–23)
Calcium: 8.9 mg/dL (ref 8.4–10.5)
GFR calc non Af Amer: 35 mL/min — ABNORMAL LOW (ref >90)
Glucose, Bld: 105 mg/dL — ABNORMAL HIGH (ref 70–99)

## 2012-11-08 LAB — URINE CULTURE

## 2012-11-08 MED ORDER — HYDROCODONE-ACETAMINOPHEN 5-325 MG PO TABS
1.0000 | ORAL_TABLET | ORAL | Status: DC | PRN
  Administered 2012-11-10 – 2012-11-15 (×4): 1 via ORAL
  Filled 2012-11-08 (×4): qty 1

## 2012-11-08 NOTE — Progress Notes (Signed)
Occupational Therapy Session Note  Patient Details  Name: Tracey Nguyen MRN: [id number] Date of Birth: 03-24-1941  Today's Date: 11/08/2012  Session 1 Time: 1000-1057 Time Calculation (min): 57 min  Short Term Goals: Week 1:  OT Short Term Goal 1 (Week 1): Short Term Goals = Long Term Goals secondary to ELOS  Skilled Therapeutic Interventions/Progress Updates:    Pt rolling out of bathroom upon arrival and rolled to sink for bathing and dressing tasks. Pt completed bathing and dressing tasks sit<>stand from w/c at sink.  Pt required steady A when standing at sink to bathe perineal area and buttocks, requiring verbal cues to adhere to weight bearing precautions.  Pt used AE appropriately to assist with bathing and dressing tasks.  Pt required assistance donning shoes.  O2 sats dropped to 85% on RA but recovered within 60 secs on 1L O2.  Focus on activity tolerance, dynamic standing balance, and safety awareness.  Therapy Documentation Precautions:  Precautions Precautions: Fall Restrictions Weight Bearing Restrictions: Yes RLE Weight Bearing: Touchdown weight bearing Other Position/Activity Restrictions: R LE Pain: Pain Assessment Pain Assessment: 0-10 Pain Score:   4 Pain Type: Surgical pain Pain Location: Hip Pain Orientation: Right Pain Descriptors: Aching Pain Intervention(s): RN made aware;Repositioned  See FIM for current functional status  Therapy/Group: Individual Therapy  Session 2 Time: 1400-1445 Pt denies pain Individual Therapy  Pt engaged in dynamic standing activities with increased emphasis on TDWB.  Pt continues to exhibit difficulty maintaining precautions.  Pt completed 2 task with 1L O2 and sats remained greater than 90%.  O2 sats on RA after 3 min activity dropeed to 74% but recovered to 90% on 2L RA within 30 secs.  [name] 11/08/2012, 11:06 AM

## 2012-11-08 NOTE — Progress Notes (Signed)
Responded to pt. Room to assist with cpap. Pt. Had already placed her cpap on. RT connected pt. Oxygen for her, pt. Has 2L of oxygen titrated into the cpap. Pt. Is on auto titrate min 5 max 18 and tolerating well.

## 2012-11-08 NOTE — Progress Notes (Signed)
Pt will place on mask when ready. Pt encouraged to call RT if pt needs any assistance. No distress noted.

## 2012-11-08 NOTE — Progress Notes (Addendum)
Subjective/Complaints: Able to sleep last night. Denies SOB this am. Right hip sore. A 12 point review of systems has been performed and if not noted above is otherwise negative.   Objective: Vital Signs: Blood pressure 96/59, pulse 73, temperature 97.5 F (36.4 C), temperature source Oral, resp. rate 18, height 4\' 9"  (1.448 m), weight 73.1 kg (161 lb 2.5 oz), SpO2 94.00%. Ct Abdomen Pelvis Wo Contrast  11/07/2012  *RADIOLOGY REPORT*  Clinical Data: Recurrent anemia and hypotension, post right-sided hip surgery (10/30/2012).  CT ABDOMEN AND PELVIS WITHOUT CONTRAST  Technique:  Multidetector CT imaging of the abdomen and pelvis was performed following the standard protocol without intravenous contrast.  Comparison: Right hip intraoperative radiographs - 10/30/2012  Findings:  The lack of intravenous contrast limits the ability to evaluate solid abdominal organs.  The patient has undergone intramedullary rod fixation of comminuted right-sided intertrochanteric femur fracture.  There is a serpiginous approximately 5.3 x 7.9 x 18.3 cm hematoma deep to the skin staples about the right lateral thigh (axial image 58, series 2, coronal image 67).  There is an additional smaller hematoma superior to the cranial aspect of the intramedullary rod fixation device which measures approximately 8.6 x 5.3 cm (axial image 66). These findings are associated with a minimal amount of asymmetric rather diffuse right-sided subcutaneous edema involving the imaged right leg.  Normal hepatic contour.  There is an approximately 6.8 x 4.8 cm slightly lobulated hypoattenuating lesion within the posterior aspect of the right lobe of the liver which is incompletely characterized though likely represents a hepatic cyst.  There is an additional smaller (approximate 7 mm) hypoattenuating lesion within the anterior aspect of the right lobe of the liver (image 19, series 2) which is too small to accurately characterize.  The gallbladder is  decompressed but otherwise unremarkable.  No ascites.  Note is made of 6 mm and 7 mm nonobstructing stones lying dependently within the left renal pelvis (axial images 33 and 34, coronal image 65).  There are additional punctate nonobstructing stones within the mid and inferior poles of the right kidney as well as a nonobstructing stones within the mid aspect of the left kidney (coronal images 69 and 73).  No evidence of urinary obstruction or perinephric stranding.  Normal noncontrast appearance of the bilateral adrenal glands and pancreas.  Incidental note is made of an indeterminate approximately 4.3 x 4.0 cm hypoattenuating lesion within the dome of the spleen, incompletely evaluated on this noncontrast examination.  No perisplenic stranding.  Moderate colonic stool burden without evidence of obstruction.  The bowel is otherwise normal in course and caliber without wall thickening.  Normal caliber abdominal aorta.  No definite retroperitoneal, mesenteric, pelvic or inguinal lymphadenopathy on this noncontrast examination.  Air is seen within the urinary bladder, presumably sequelae of recent Foley catheter insertion.  Normal noncontrast appearance of the pelvic organs.  No free fluid in the pelvis.  Limited visualization of the lower thorax demonstrates bibasilar ground-glass opacities, left greater than right, favored to represent atelectasis.  No focal airspace opacities.  No pleural effusion.  Normal heart size.  There is overall decreased attenuation of the intracardiac blood pool compatible with provided history of anemia.  IMPRESSION: 1.  Post intramedullary rod fixation of comminuted right-sided intratrochanteric femur fracture. 2.  Serpiginous approximately 5.3 x 7.9 x 18.3 cm hematoma deep to the right lateral thigh skin staples with an additional smaller approximately 8.6 x 5.3 cm hematoma adjacent to the cranial aspect of the intramedullary femur rod.  3.  Bilateral nonobstructing nephrolithiasis  including adjacent 6 and a 7 mm stones within the right renal pelvis.  4.  Indeterminate approximately 6.8 cm hypoattenuating lesion within the dome of the right lobe of the liver, incompletely evaluated without intravenous contrast though likely represents a hepatic cyst. 5.  Indeterminate approximately 4.3 cm hypoattenuating lesion within the dome of the spleen, incompletely evaluated without intravenous contrast though may represent a splenic hemangioma.   Original Report Authenticated By: Tacey Ruiz, MD    Dg Chest Port 1 View  11/07/2012  *RADIOLOGY REPORT*  Clinical Data:  Hypoxia, hypertension, asthma  PORTABLE CHEST - 1 VIEW  Comparison: Portable exam 1114 hours compared to 2012/09/18  Findings: Pronounced dextroconvex scoliosis. Enlargement of cardiac silhouette. Mediastinal contours and pulmonary vascularity normal. Minimal left basilar atelectasis. No definite infiltrate, pleural effusion or pneumothorax. No acute osseous findings.  IMPRESSION: Enlargement of cardiac silhouette. Minimal left basilar atelectasis.   Original Report Authenticated By: Ulyses Southward, M.D.     Recent Labs  11/07/12 1026 11/08/12 0650  WBC 13.4* 10.3  HGB 7.3* 9.2*  HCT 23.1* 28.2*  PLT 236 181    Recent Labs  11/07/12 1026 11/08/12 0650  NA 137 140  K 3.9 4.0  CL 94* 99  GLUCOSE 123* 105*  BUN 35* 27*  CREATININE 1.81* 1.44*  CALCIUM 9.2 8.9   CBG (last 3)  No results found for this basename: GLUCAP,  in the last 72 hours  Wt Readings from Last 3 Encounters:  11/02/12 73.1 kg (161 lb 2.5 oz)  10/30/12 72.576 kg (160 lb)  10/30/12 72.576 kg (160 lb)    Physical Exam:  Constitutional: She is oriented to person, place, and time. She appears well-developed and well-nourished.  Morbidly obese  HENT:  Head: Normocephalic and atraumatic.  Eyes: Pupils are equal, round, and reactive to light.  Neck: Normal range of motion.  Cardiovascular: Normal rate and regular rhythm.  Pulmonary/Chest:  Effort normal. Chest clear. No rales or rhonchi Abdominal: Bowel sounds are normal. Non distended. Non tender Musculoskeletal: She exhibits edema ( Right hip/thigh edema.-firm around op site, minimal drainage  ).  Neurological: She is alert and oriented to person, place, and time. Strength 4/5 UE. Lower ext 1+ RHF, 2RKE, 4 ankle. LLE is 3 to 4+/5. No sensory deficits, DTR's 1+ Skin: Skin is warm and dry other than right hip wound which has mild serosanginous drainage. Wound is well approximated.   Assessment/Plan: 1. Functional deficits secondary to failed right femur ORIF s/p revision which require 3+ hours per day of interdisciplinary therapy in a comprehensive inpatient rehab setting. Physiatrist is providing close team supervision and 24 hour management of active medical problems listed below. Physiatrist and rehab team continue to assess barriers to discharge/monitor patient progress toward functional and medical goals. FIM: FIM - Bathing Bathing Steps Patient Completed: Chest;Right Arm;Left Arm;Abdomen;Front perineal area Bathing: 4: Min-Patient completes 8-9 12f 10 parts or 75+ percent  FIM - Upper Body Dressing/Undressing Upper body dressing/undressing steps patient completed: Thread/unthread right bra strap;Thread/unthread left bra strap;Thread/unthread right sleeve of pullover shirt/dresss;Thread/unthread left sleeve of pullover shirt/dress;Put head through opening of pull over shirt/dress;Pull shirt over trunk Upper body dressing/undressing: 4: Min-Patient completed 75 plus % of tasks FIM - Lower Body Dressing/Undressing Lower body dressing/undressing steps patient completed: Thread/unthread left underwear leg;Thread/unthread right underwear leg;Pull underwear up/down;Thread/unthread left pants leg;Pull pants up/down Lower body dressing/undressing: 3: Mod-Patient completed 50-74% of tasks  FIM - Toileting Toileting steps completed by patient: Adjust clothing  prior to  toileting;Performs perineal hygiene;Adjust clothing after toileting Toileting Assistive Devices: Grab bar or rail for support Toileting: 4: Steadying assist  FIM - Diplomatic Services operational officer Devices: Grab bars Toilet Transfers: 4-To toilet/BSC: Min A (steadying Pt. > 75%);4-From toilet/BSC: Min A (steadying Pt. > 75%)  FIM - Bed/Chair Transfer Bed/Chair Transfer Assistive Devices: Bed rails;Walker Bed/Chair Transfer: 0: Activity did not occur  FIM - Locomotion: Wheelchair Locomotion: Wheelchair: 0: Activity did not occur FIM - Locomotion: Ambulation Locomotion: Ambulation Assistive Devices: Designer, industrial/product Ambulation/Gait Assistance: Not tested (comment) Locomotion: Ambulation: 0: Activity did not occur  Comprehension Comprehension Mode: Auditory Comprehension: 6-Follows complex conversation/direction: With extra time/assistive device  Expression Expression Mode: Verbal Expression: 6-Expresses complex ideas: With extra time/assistive device  Social Interaction Social Interaction: 6-Interacts appropriately with others with medication or extra time (anti-anxiety, antidepressant).  Problem Solving Problem Solving: 6-Solves complex problems: With extra time  Memory Memory: 6-More than reasonable amt of time  Medical Problem List and Plan:  1. DVT Prophylaxis/Anticoagulation: Pharmaceutical: Xarelto  2. Pain Management: prn ultram. celebrex reduced to qday.   - scheduled hydrocodone for better control-   -muscle relaxant 3. Mood: Pleasant and appropriate with good overall outlook.    4. Neuropsych: This patient is capable of making decisions on his/her own behalf.  5. ABLA: continue iron supplement. Recheck Thursday. 9.8 on friday 6. OSA: CPAP with oxygen when asleep.  7. Recent sinusitis: augmentin completed 8. Asthma: Monitor for dyspnea with activity. Continue symbicort and Flonase.  -encourage IS  -OOB as much as possible 9. HTN: Will monitor with bid  checks. Continue cozaar, and bystolic.  -HOLD LASIX  -prn hold parameters on meds for orthostasis  -encourage adequate fluids 10. Perioperative hematoma-  -s/p blood transfusion  -anticoagulation stopped  -blood count 9.2 this am  -feels relatively comfortable, hemodynamically stable  -resume activity as tolerated  -ortho contacted 11. Pre-renal azotemia: fluid resuscitation  -labs with improvement today.  -lasix held  LOS (Days) 6 A FACE TO FACE EVALUATION WAS PERFORMED  Cecilia Vancleve T 11/08/2012 8:45 AM

## 2012-11-08 NOTE — Plan of Care (Signed)
Problem: RH BOWEL ELIMINATION Goal: RH STG MANAGE BOWEL W/MEDICATION W/ASSISTANCE STG Manage Bowel with Medication with mod I Assistance. Outcome: Not Progressing Required suppository for BM

## 2012-11-08 NOTE — Progress Notes (Signed)
Physical Therapy Note  Patient Details  Name: Tracey Nguyen MRN: [id number] Date of Birth: 1941-07-29 Today's Date: 11/08/2012  0900- 0955 (55 minutes) individual Pain: 4/10 Rt hip/ nurse notified/ pain meds given Focus of treatment : bed mobility training; bilateral LE AROM/strengthening Treatment: Pt off bedrest with therapies as tolerated per verbal orders from PA Rinaldo Cloud); Pt in bed upon arrival on RA with noticeable labored breathing ; oxygen sats 88% RA ; placed on 1 L [place] with sats recovering to 93%; supine to sit mod assist ; transfers with RW SBA TDWB RT LE; sit to supine (mat) min assist RT LE; bilateral LE AROM/strengthening X 20 - ankle pumps, quad sets, heel slides (AA on right), SAQs ; supine to sit (mat) min assist RT LE with increased effort.   1445-1510 (25 minutes) individual Pain: 4/10 RT hip/ premedicated Focus of treatment: gait training/ focus on increased antigravity RT hip flexion Treatment: Stepping up to 4 inch step on right in parallel bars 3 X 5; gait RW 20 feet close SBA. Oxygen sats > 92%  On 2 L [place] with 2/4 dyspnea.      Jouri Threat,JIM 11/08/2012, 9:36 AM

## 2012-11-08 NOTE — Progress Notes (Signed)
Social Work Patient ID: Tracey Nguyen, female   DOB: [birth year], 72 y.o.   MRN: [id number]  Met with patient and spoke with husband via phone this afternoon to review team conference information. Explained to both the targeted d/c had been set for 4/8, however, this was prior to medical issues found yesterday which slightly limited her therapies today.  Stressed to both that team is recommending that pt have someone at home with her upon d/c as she continues to have difficulty maintaining precautions and following directions with therapies.  Concern that her safety will warrant need for 24/7 supervision at least initially.   Pt's husband reports that he has to work full days on Tues, Wed and Thurs each week and that he has very little flexibility with this.  He also states that he feels the LOS is "too short".  Does report that he might be able to arrange "somebody to stay with her for 1 or 2 days".   Explained to him that I would plan to follow up with therapies and MD to alert to his concerns and his limitations with caregiver availability.  Plan to meet again with pt and spouse on Friday afternoon to make decision about d/c plans.

## 2012-11-09 ENCOUNTER — Inpatient Hospital Stay (HOSPITAL_COMMUNITY): Payer: Medicare Other | Admitting: Physical Therapy

## 2012-11-09 ENCOUNTER — Inpatient Hospital Stay (HOSPITAL_COMMUNITY): Payer: Medicare Other

## 2012-11-09 DIAGNOSIS — M7989 Other specified soft tissue disorders: Secondary | ICD-10-CM

## 2012-11-09 DIAGNOSIS — IMO0002 Reserved for concepts with insufficient information to code with codable children: Secondary | ICD-10-CM

## 2012-11-09 DIAGNOSIS — M79609 Pain in unspecified limb: Secondary | ICD-10-CM

## 2012-11-09 LAB — BASIC METABOLIC PANEL
CO2: 32 mEq/L (ref 19–32)
Calcium: 9.3 mg/dL (ref 8.4–10.5)
Creatinine, Ser: 1.24 mg/dL — ABNORMAL HIGH (ref 0.50–1.10)
Glucose, Bld: 106 mg/dL — ABNORMAL HIGH (ref 70–99)

## 2012-11-09 MED ORDER — FUROSEMIDE 20 MG PO TABS
20.0000 mg | ORAL_TABLET | Freq: Every day | ORAL | Status: DC
  Administered 2012-11-09 – 2012-11-16 (×8): 20 mg via ORAL
  Filled 2012-11-09 (×9): qty 1

## 2012-11-09 MED ORDER — POLYETHYLENE GLYCOL 3350 17 G PO PACK
17.0000 g | PACK | Freq: Two times a day (BID) | ORAL | Status: DC
  Administered 2012-11-09 – 2012-11-15 (×6): 17 g via ORAL
  Filled 2012-11-09 (×17): qty 1

## 2012-11-09 NOTE — Progress Notes (Signed)
Subjective/Complaints: Fair night. Right hip still tight, but pain no worse. Able to tolerate therapies yesterday. A 12 point review of systems has been performed and if not noted above is otherwise negative.   Objective: Vital Signs: Blood pressure 132/76, pulse 70, temperature 98.4 F (36.9 C), temperature source Oral, resp. rate 18, height 4\' 9"  (1.448 m), weight 74.2 kg (163 lb 9.3 oz), SpO2 98.00%. Ct Abdomen Pelvis Wo Contrast  11/07/2012  *RADIOLOGY REPORT*  Clinical Data: Recurrent anemia and hypotension, post right-sided hip surgery (10/30/2012).  CT ABDOMEN AND PELVIS WITHOUT CONTRAST  Technique:  Multidetector CT imaging of the abdomen and pelvis was performed following the standard protocol without intravenous contrast.  Comparison: Right hip intraoperative radiographs - 10/30/2012  Findings:  The lack of intravenous contrast limits the ability to evaluate solid abdominal organs.  The patient has undergone intramedullary rod fixation of comminuted right-sided intertrochanteric femur fracture.  There is a serpiginous approximately 5.3 x 7.9 x 18.3 cm hematoma deep to the skin staples about the right lateral thigh (axial image 58, series 2, coronal image 67).  There is an additional smaller hematoma superior to the cranial aspect of the intramedullary rod fixation device which measures approximately 8.6 x 5.3 cm (axial image 66). These findings are associated with a minimal amount of asymmetric rather diffuse right-sided subcutaneous edema involving the imaged right leg.  Normal hepatic contour.  There is an approximately 6.8 x 4.8 cm slightly lobulated hypoattenuating lesion within the posterior aspect of the right lobe of the liver which is incompletely characterized though likely represents a hepatic cyst.  There is an additional smaller (approximate 7 mm) hypoattenuating lesion within the anterior aspect of the right lobe of the liver (image 19, series 2) which is too small to accurately  characterize.  The gallbladder is decompressed but otherwise unremarkable.  No ascites.  Note is made of 6 mm and 7 mm nonobstructing stones lying dependently within the left renal pelvis (axial images 33 and 34, coronal image 65).  There are additional punctate nonobstructing stones within the mid and inferior poles of the right kidney as well as a nonobstructing stones within the mid aspect of the left kidney (coronal images 69 and 73).  No evidence of urinary obstruction or perinephric stranding.  Normal noncontrast appearance of the bilateral adrenal glands and pancreas.  Incidental note is made of an indeterminate approximately 4.3 x 4.0 cm hypoattenuating lesion within the dome of the spleen, incompletely evaluated on this noncontrast examination.  No perisplenic stranding.  Moderate colonic stool burden without evidence of obstruction.  The bowel is otherwise normal in course and caliber without wall thickening.  Normal caliber abdominal aorta.  No definite retroperitoneal, mesenteric, pelvic or inguinal lymphadenopathy on this noncontrast examination.  Air is seen within the urinary bladder, presumably sequelae of recent Foley catheter insertion.  Normal noncontrast appearance of the pelvic organs.  No free fluid in the pelvis.  Limited visualization of the lower thorax demonstrates bibasilar ground-glass opacities, left greater than right, favored to represent atelectasis.  No focal airspace opacities.  No pleural effusion.  Normal heart size.  There is overall decreased attenuation of the intracardiac blood pool compatible with provided history of anemia.  IMPRESSION: 1.  Post intramedullary rod fixation of comminuted right-sided intratrochanteric femur fracture. 2.  Serpiginous approximately 5.3 x 7.9 x 18.3 cm hematoma deep to the right lateral thigh skin staples with an additional smaller approximately 8.6 x 5.3 cm hematoma adjacent to the cranial aspect of the  intramedullary femur rod.  3.  Bilateral  nonobstructing nephrolithiasis including adjacent 6 and a 7 mm stones within the right renal pelvis.  4.  Indeterminate approximately 6.8 cm hypoattenuating lesion within the dome of the right lobe of the liver, incompletely evaluated without intravenous contrast though likely represents a hepatic cyst. 5.  Indeterminate approximately 4.3 cm hypoattenuating lesion within the dome of the spleen, incompletely evaluated without intravenous contrast though may represent a splenic hemangioma.   Original Report Authenticated By: Tacey Ruiz, MD    Dg Chest Port 1 View  11/07/2012  *RADIOLOGY REPORT*  Clinical Data:  Hypoxia, hypertension, asthma  PORTABLE CHEST - 1 VIEW  Comparison: Portable exam 1114 hours compared to 2012/09/09  Findings: Pronounced dextroconvex scoliosis. Enlargement of cardiac silhouette. Mediastinal contours and pulmonary vascularity normal. Minimal left basilar atelectasis. No definite infiltrate, pleural effusion or pneumothorax. No acute osseous findings.  IMPRESSION: Enlargement of cardiac silhouette. Minimal left basilar atelectasis.   Original Report Authenticated By: Ulyses Southward, M.D.     Recent Labs  11/07/12 1026 11/08/12 0650 11/09/12 0500  WBC 13.4* 10.3  --   HGB 7.3* 9.2* 9.5*  HCT 23.1* 28.2* 29.2*  PLT 236 181  --     Recent Labs  11/08/12 0650 11/09/12 0500  NA 140 138  K 4.0 3.9  CL 99 98  GLUCOSE 105* 106*  BUN 27* 18  CREATININE 1.44* 1.24*  CALCIUM 8.9 9.3   CBG (last 3)  No results found for this basename: GLUCAP,  in the last 72 hours  Wt Readings from Last 3 Encounters:  11/08/12 74.2 kg (163 lb 9.3 oz)  10/30/12 72.576 kg (160 lb)  10/30/12 72.576 kg (160 lb)    Physical Exam:  Constitutional: She is oriented to person, place, and time. She appears well-developed and well-nourished.  Morbidly obese  HENT:  Head: Normocephalic and atraumatic.  Eyes: Pupils are equal, round, and reactive to light.  Neck: Normal range of motion.   Cardiovascular: Normal rate and regular rhythm.  Pulmonary/Chest: Effort normal. Chest clear. No rales or rhonchi Abdominal: Bowel sounds are normal. Non distended. Non tender Musculoskeletal: She exhibits edema ( Right hip/thigh edema.-firm around op site, minimal drainage  ).  Neurological: She is alert and oriented to person, place, and time. Strength 4/5 UE. Lower ext 1+ RHF, 2RKE, 4 ankle. LLE is 3 to 4+/5. No sensory deficits, DTR's 1+ Skin: Skin is warm and dry other than right hip wound which has mild serosanginous drainage. Wound is well approximated.   Assessment/Plan: 1. Functional deficits secondary to failed right femur ORIF s/p revision which require 3+ hours per day of interdisciplinary therapy in a comprehensive inpatient rehab setting. Physiatrist is providing close team supervision and 24 hour management of active medical problems listed below. Physiatrist and rehab team continue to assess barriers to discharge/monitor patient progress toward functional and medical goals. FIM: FIM - Bathing Bathing Steps Patient Completed: Chest;Right Arm;Left Arm;Abdomen;Front perineal area;Buttocks;Right upper leg;Left upper leg;Right lower leg (including foot);Left lower leg (including foot) Bathing: 4: Steadying assist  FIM - Upper Body Dressing/Undressing Upper body dressing/undressing steps patient completed: Thread/unthread right bra strap;Thread/unthread left bra strap;Thread/unthread right sleeve of pullover shirt/dresss;Put head through opening of pull over shirt/dress;Thread/unthread left sleeve of pullover shirt/dress;Pull shirt over trunk Upper body dressing/undressing: 4: Min-Patient completed 75 plus % of tasks FIM - Lower Body Dressing/Undressing Lower body dressing/undressing steps patient completed: Thread/unthread right underwear leg;Thread/unthread left underwear leg;Pull underwear up/down;Thread/unthread right pants leg;Thread/unthread left pants leg;Pull  pants  up/down;Don/Doff right sock;Don/Doff left sock Lower body dressing/undressing: 3: Mod-Patient completed 50-74% of tasks  FIM - Toileting Toileting steps completed by patient: Adjust clothing prior to toileting;Performs perineal hygiene;Adjust clothing after toileting Toileting Assistive Devices: Grab bar or rail for support Toileting: 4: Steadying assist  FIM - Diplomatic Services operational officer Devices: Grab bars Toilet Transfers: 4-To toilet/BSC: Min A (steadying Pt. > 75%);4-From toilet/BSC: Min A (steadying Pt. > 75%)  FIM - Bed/Chair Transfer Bed/Chair Transfer Assistive Devices: Bed rails;Walker Bed/Chair Transfer: 0: Activity did not occur  FIM - Locomotion: Wheelchair Locomotion: Wheelchair: 0: Activity did not occur FIM - Locomotion: Ambulation Locomotion: Ambulation Assistive Devices: Designer, industrial/product Ambulation/Gait Assistance: Not tested (comment) Locomotion: Ambulation: 0: Activity did not occur  Comprehension Comprehension Mode: Auditory Comprehension: 7-Follows complex conversation/direction: With no assist  Expression Expression Mode: Verbal Expression: 7-Expresses complex ideas: With no assist  Social Interaction Social Interaction: 7-Interacts appropriately with others - No medications needed.  Problem Solving Problem Solving: 6-Solves complex problems: With extra time  Memory Memory Assistive Devices: Memory book Memory: 6-More than reasonable amt of time  Medical Problem List and Plan:  1. DVT Prophylaxis/Anticoagulation: Pharmaceutical: Xarelto stopped.  -will check dopplers given the need to hold anticoagulation indefinitely 2. Pain Management: prn ultram. celebrex reduced to qday.   - scheduled hydrocodone for better control-   -muscle relaxants  -activity to tolerance. 3. Mood: Pleasant and appropriate with good overall outlook.    4. Neuropsych: This patient is capable of making decisions on his/her own behalf.  5. ABLA: continue  iron supplement.  See below 6. OSA: CPAP with oxygen when asleep. Oxygen as needed when up 7. Recent sinusitis: augmentin completed 8. Asthma: Monitor for dyspnea with activity. Continue symbicort and Flonase.  -encourage IS  -OOB as much as possible 9. HTN: Will monitor with bid checks. Continue cozaar, and bystolic.  -HOLD LASIX  -prn hold parameters on meds for orthostasis  -encourage adequate fluids 10. Perioperative hematoma-  -s/p blood transfusion  -anticoagulation stopped  -blood count 9.4 this am  -feels relatively comfortable, hemodynamically stable  -resumed activity as tolerated  -ortho contacted twice 11. Pre-renal azotemia: fluid resuscitation  -essentiall resolved  -resume low dose lasix  LOS (Days) 7 A FACE TO FACE EVALUATION WAS PERFORMED  SWARTZ,ZACHARY T 11/09/2012 8:25 AM

## 2012-11-09 NOTE — Progress Notes (Addendum)
Physical Therapy Weekly Progress Note  Patient Details  Name: Tracey Nguyen MRN: [id number] Date of Birth: [birth year]  Today's Date: 11/09/2012 Time: 0730-0829 Time Calculation (min): 59 min  Patient has partially met 2 of 3 short term goals.  Pt is making slow gains, her progress has been inhibited by a hematoma at the surgical site which has delayed progress. Pt continues to need safety cues for most mobility and struggles to remember bed mobility sequence, use of wheelchair brakes, and wheelchair set up for transfers. Pt will either need extended time to reach modified independent level or she will need supervision upon return home.  Patient continues to demonstrate the following deficits: significant bil. LE weakness, decreased cardiovascular endurance, decreased activity tolerance, decreased safety with mobility secondary to impaired memory and decreased adherence to weight bearing precautions and therefore will continue to benefit from skilled PT intervention to enhance overall performance with activity tolerance, balance, ability to compensate for deficits, functional use of  right lower extremity, awareness and knowledge of precautions.  Patient progressing toward long term goals..  Plan of care revisions: pt may require supervision at D/C.  PT Short Term Goals Week 1:  PT Short Term Goal 1 (Week 1): Pt will ambulate x 50' with supervision and LRAD. PT Short Term Goal 1 - Progress (Week 1): Not met PT Short Term Goal 2 (Week 1): Pt will transfer to variety of surfaces with supervision PT Short Term Goal 2 - Progress (Week 1): Partly met PT Short Term Goal 3 (Week 1): Pt will ambulation up/down ramp with supervision and LRAD.  PT Short Term Goal 3 - Progress (Week 1): Partly met  Skilled Therapeutic Interventions/Progress Updates:    Pt verbally cleared for therapies by Dr. Riley Kill. Wheelchair propulsion x 170' for strengthening and conditioning, 3 rest breaks needed. Practiced  wheelchair set up for transfers, RW management and wheelchair <> bed stand pivot transfer with RW. Discussed placement of wheelchair to avoid having it as an obstacle to get into bed (pt with decreased recall of earlier instruction. Pt needs mod verbal cues for brakes, leg rest management, and negotiation of wheelchair. Close supervision/min-guard assist for transfer. Min assist sit > supine with increased effort and leg lifter. Stand by assist for supine >sit. Cues throughout for efficiency of bed mobility. Rt. LE heel slides and hip abduction/adduction AAROM. Pt given handout with 4 LE exercises to begin doing at night, verbally reviewed.   SpO2 = 96% on 1L  O2 at start of treatment, BP 110/62. SPO2 90% at lowest.   Therapy Documentation Precautions:  Precautions Precautions: Fall Restrictions Weight Bearing Restrictions: Yes RLE Weight Bearing: Touchdown weight bearing Other Position/Activity Restrictions: R LE Pain: Pain Assessment Pain Assessment: No/denies pain  See FIM for current functional status  Therapy/Group: Individual Therapy  Tracey Nguyen 11/09/2012, 11:30 AM

## 2012-11-09 NOTE — Progress Notes (Signed)
Occupational Therapy Weekly Progress Note  Patient Details  Name: Tracey Nguyen MRN: [id number] Date of Birth: 22-Jun-1941  Today's Date: 11/09/2012  Pt has progressed slowly this past week and continues to require min A for LB dressing, toilet transfers, and toileting.  Pt uses AE (reacher, sponge, and sock aide) appropriately to complete tasks.  Pt fatigues quickly and requires multiple rest breaks during tasks. Patient having difficult time adhering to TDWB status -> RLE.   Patient continues to demonstrate the following deficits: decreased overall activity tolerance/endurance, decreased independence with functional mobility, decreased overall dynamic standing balance/tolerance/endurance, decreased independence with BADLs & IADLs, difficulty adhering to TDWB status -> RLE. Therefore, patient will continue to benefit from skilled OT intervention to enhance overall performance with BADL, iADL and Reduce care partner burden.  Patient progressing toward long term goals..  Continue plan of care.  OT Short Term Goals STG=LTG (mod I for bathing and dressing)  Skilled Therapeutic Interventions/Progress Updates:  Balance/vestibular training;Community reintegration;Discharge planning;DME/adaptive equipment instruction;Functional mobility training;Neuromuscular re-education;Pain management;Patient/family education;Psychosocial support;Self Care/advanced ADL retraining;Skin care/wound managment;Splinting/orthotics;Therapeutic Activities;Therapeutic Exercise;UE/LE Strength taining/ROM;UE/LE Coordination activities;Wheelchair propulsion/positioning   Precautions:  Precautions Precautions: Fall Restrictions Weight Bearing Restrictions: Yes RLE Weight Bearing: Touchdown weight bearing Other Position/Activity Restrictions: R LE  Vital Signs: Therapy Vitals Pulse Rate: 100 (with activity) Oxygen Therapy SpO2: 95 % O2 Device: Nasal cannula O2 Flow Rate (L/min): 1 L/min  Pain: Pain  Assessment Pain Assessment: No/denies pain Pain Score:   8 Pain Type: Surgical pain Pain Location: Hip Pain Orientation: Right Pain Descriptors: Sore Pain Onset: On-going Pain Intervention(s):  (pt going to call for pain medication at end of session)  See FIM for current functional status  Agape Hardiman 11/09/2012, 12:16 PM

## 2012-11-09 NOTE — Progress Notes (Signed)
Occupational Therapy Session Note  Patient Details  Name: Tracey Nguyen MRN: [id number] Date of Birth: 20-May-1941  Today's Date: 11/09/2012  Session 1 Time: 0900-1000 Time Calculation (min): 60 min  Short Term Goals: Week 1:  OT Short Term Goal 1 (Week 1): Short Term Goals = Long Term Goals secondary to ELOS  Skilled Therapeutic Interventions/Progress Updates:    Pt seated in w/c upon arrival.  Pt completed bathing and dressing tasks w/c level at sink.  Pt using AE to assist with LB bathing and dressing but continues to require assistance for LB dressing.  Pt requires occasional verbal cues to adhere to TDWB on RLE and question if patient maintains precautions.  Pt continues to fatigue quickly and O2 sats drop to 85% on RA with activity.  Pt requires multiple rest breaks throughout session.  Therapy Documentation Precautions:  Precautions Precautions: Fall Restrictions Weight Bearing Restrictions: Yes RLE Weight Bearing: Touchdown weight bearing Other Position/Activity Restrictions: R LE Pain: Pain Assessment Pain Assessment: No/denies pain  See FIM for current functional status  Therapy/Group: Individual Therapy  Session 2 Time: 1345-1430 Pt c/o 5/10 pain in right hip; RN aware; repositioned Individual Therapy  Pt practiced bed transfers and bed<>BSC transfers.  Pt O2 sats drop to 82% on RA and 85% on 1L O2 after activity but recovers within 60 seconds.  Pt required min guard assist with transfers.  During discharge planning discussions patient stated that her home was small and had a lot of furniture and was concerned about w/c accessibility.  In addition, pt stated she hope she didn't have to use O2 on discharge because she would have difficulty managing tubing.  I notified social work about concerns.  [name] [facility] 11/09/2012, 10:07 AM

## 2012-11-09 NOTE — Progress Notes (Signed)
Physical Therapy Session Note  Patient Details  Name: Tracey Nguyen MRN: [id number] Date of Birth: 03-14-1941  Today's Date: 11/09/2012 Time: 7829-5621 Time Calculation (min): 27 min  Short Term Goals: Week 2:     Skilled Therapeutic Interventions/Progress Updates:    Bathroom mobility with RW, close supervision and cues for weight bearing precautions. Rest of session focused on gait training and activity tolerance. Pt ambulation 2 x 30' with RW, min-guard assist and cues needed for weight bearing precautions particularly with fatigue. Prior to sit > stands pt still needs cuing for locking brakes. After ambulation pt's SpO2 dropped to 85% and 89% on 1L [name] with respective bouts however returned to >95% with seated rest and 1L O2. Pt continues to demonstrate very low endurance.   Therapy Documentation Precautions:  Precautions Precautions: Fall Restrictions Weight Bearing Restrictions: Yes RLE Weight Bearing: Touchdown weight bearing Other Position/Activity Restrictions: R LE Pain: Pain Assessment Pain Score:   8 Pain Type: Surgical pain Pain Location: Hip Pain Orientation: Right Pain Descriptors: Sore Pain Onset: On-going Pain Intervention(s):  (pt going to call for pain medication at end of session)  See FIM for current functional status  Therapy/Group: Individual Therapy  Wilhemina Bonito 11/09/2012, 11:40 AM

## 2012-11-09 NOTE — Progress Notes (Signed)
Bilateral:  No evidence of DVT, superficial thrombosis, or Baker's Cyst.   

## 2012-11-09 NOTE — Progress Notes (Signed)
Placed pt on CPAP via patients home nasal mask. Auto titrate settings (low 5.0- high 18.0) with 2 lpm O2 bleed in.  Pt. Is tolerating well at this time.  RN aware.

## 2012-11-10 ENCOUNTER — Inpatient Hospital Stay (HOSPITAL_COMMUNITY): Payer: Medicare Other

## 2012-11-10 ENCOUNTER — Inpatient Hospital Stay (HOSPITAL_COMMUNITY): Payer: Medicare Other | Admitting: Physical Therapy

## 2012-11-10 DIAGNOSIS — D62 Acute posthemorrhagic anemia: Secondary | ICD-10-CM

## 2012-11-10 DIAGNOSIS — G4733 Obstructive sleep apnea (adult) (pediatric): Secondary | ICD-10-CM

## 2012-11-10 DIAGNOSIS — S72009A Fracture of unspecified part of neck of unspecified femur, initial encounter for closed fracture: Secondary | ICD-10-CM

## 2012-11-10 LAB — HEMOGLOBIN AND HEMATOCRIT, BLOOD
HCT: 28.9 % — ABNORMAL LOW (ref 36.0–46.0)
Hemoglobin: 9.1 g/dL — ABNORMAL LOW (ref 12.0–15.0)

## 2012-11-10 NOTE — Progress Notes (Signed)
Occupational Therapy Session Note  Patient Details  Name: Tracey Nguyen MRN: [id number] Date of Birth: 30-Oct-1940  Today's Date: 11/10/2012  Session 1 Time: 0700-0755 Time Calculation (min): 55 min  Short Term Goals: Week 2:   STG=LTG secondary to ELOS  Skilled Therapeutic Interventions/Progress Updates:    Pt in bed upon arrival but agreeable to bathing and dressing w/c level at sink.  Pt required min A for supine to sit and steady A for stand pivot transfer to w/c.  Pt completed BADLs with sit<>stand at sink with assistance for LB dressing.  Pt uses reacher, long handle sponge, and sock aide appropriately during tasks.  Pt required verbal cues for adhering to RLE TDWB and commented that she was having difficulty maintaining restrictions. Focus on activity tolerance, safety awareness, dynamic standing balance, and transfers. Pt's O2 sats at 84% on RA and >90% on 1L O2.  Therapy Documentation Precautions:  Precautions Precautions: Fall Restrictions Weight Bearing Restrictions: Yes RLE Weight Bearing: Touchdown weight bearing Other Position/Activity Restrictions: R LE Pain: Pain Assessment Pain Assessment: 0-10 Pain Score:   5 Pain Type: Acute pain Pain Location: Hip Pain Orientation: Right Pain Descriptors: Aching Pain Onset: With Activity Patients Stated Pain Goal: 2 Pain Intervention(s): RN made aware;Repositioned  See FIM for current functional status  Therapy/Group: Individual Therapy  Session 2 Time: 0900-0930 Pt denies pain Individual Therapy Pt engaged in BUE therex with 2# weight bar, 3# weight bar and PNF exercises with weighted ball (2 kg).  O2 sats dropped to 83% on 1L RA and recovered to >90% in 60 seconds.  Focus on activity tolerance and BUE strength to assist with RW transfers and RW ambulation while maintaining RLE TDWB.  [name] [facility] 11/10/2012, 8:00 AM

## 2012-11-10 NOTE — Progress Notes (Signed)
Physical Therapy Note  Patient Details  Name: GRIER CZERWINSKI MRN: [id number] Date of Birth: [birth year] Today's Date: 11/10/2012  1000-1055 (55 minutes) individual Pain; 2/10 to 3/10 with activity RT Hip / premedicated Focus of treatment: Wc assessment with Vendor ; bilateral LE AROM/strengthening; gait training TDWB RT LE; wc mobility/endurance Treatment: Wc mobility- 75 feet SBA X 2 with one rest break; wc setup - vcs for brakes , max tactile cues for legrests; transfer SBA RW TDWB RT LE; pt assessed for wc needs at home by vendor Lorin Picket); sit to supine (mat) min assist RT LE ; supine to sit min using sheet to assist RT LE + min assist trunk; bilateral LE AROM/strengthening x 20 - ankle pumps, quad sets, SAQs, heel slides (using sheet to assist Rt LE); standing RT hip flexion with RW support; gait 20 feet RW TDWB RT LE SBA.   1400-1440 ( 40 minutes) individual  Pain: no complaint of pain Focus of treatment: Car transfer training; Therapeutic exercise focused on activity tolerance Treatment: Nustep Level 4 X 3 minutes, 2 minutes (bilateral UEs,LT LE) with oxygen sats >92% on 1 L [place]; Husband observed car transfer with RW min to SBA ( assist with RT LE). Attempted SUV height transfer but they will use car at this time.    Shaniece Bussa,JIM 11/10/2012, 10:39 AM

## 2012-11-10 NOTE — Progress Notes (Signed)
Dr. Charlann Boxer called and was updated regarding hematoma and asked to evaluate xray. He  recommends follow up in office past discharge.

## 2012-11-10 NOTE — Progress Notes (Signed)
Subjective/Complaints: No new issues. Remains a little sob which is near her baseline. Pain under control. A 12 point review of systems has been performed and if not noted above is otherwise negative.   Objective: Vital Signs: Blood pressure 112/76, pulse 65, temperature 98.3 F (36.8 C), temperature source Oral, resp. rate 20, height 4\' 9"  (1.448 m), weight 74.2 kg (163 lb 9.3 oz), SpO2 95.00%. No results found.  Recent Labs  11/07/12 1026 11/08/12 0650 11/09/12 0500 11/10/12 0620  WBC 13.4* 10.3  --   --   HGB 7.3* 9.2* 9.5* 9.1*  HCT 23.1* 28.2* 29.2* 28.9*  PLT 236 181  --   --     Recent Labs  11/08/12 0650 11/09/12 0500  NA 140 138  K 4.0 3.9  CL 99 98  GLUCOSE 105* 106*  BUN 27* 18  CREATININE 1.44* 1.24*  CALCIUM 8.9 9.3   CBG (last 3)  No results found for this basename: GLUCAP,  in the last 72 hours  Wt Readings from Last 3 Encounters:  11/08/12 74.2 kg (163 lb 9.3 oz)  10/30/12 72.576 kg (160 lb)  10/30/12 72.576 kg (160 lb)    Physical Exam:  Constitutional: She is oriented to person, place, and time. She appears well-developed and well-nourished.  Morbidly obese  HENT:  Head: Normocephalic and atraumatic.  Eyes: Pupils are equal, round, and reactive to light.  Neck: Normal range of motion.  Cardiovascular: Normal rate and regular rhythm.  Pulmonary/Chest: Effort normal. Chest clear. No rales or rhonchi Abdominal: Bowel sounds are normal. Non distended. Non tender Musculoskeletal: She exhibits edema ( Right hip/thigh edema.-firm around op site, minimal drainage  ).  Neurological: She is alert and oriented to person, place, and time. Strength 4/5 UE. Lower ext 1+ RHF, 2RKE, 4 ankle.---pain inhibition. LLE is 4 to 4+/5. No sensory deficits, DTR's 1+ Skin: Skin is warm and dry other than right hip wound which has mild serosanginous drainage. Wound is well approximated.   Assessment/Plan: 1. 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 Physiatrist is providing close team supervision and 24 hour management of active medical problems listed below. Physiatrist and rehab team continue to assess barriers to discharge/monitor patient progress toward functional and medical goals. FIM: FIM - Bathing Bathing Steps Patient Completed: Chest;Right Arm;Left Arm;Abdomen;Front perineal area;Buttocks;Right upper leg;Left upper leg;Right lower leg (including foot);Left lower leg (including foot) Bathing: 4: Steadying assist  FIM - Upper Body Dressing/Undressing Upper body dressing/undressing steps patient completed: Thread/unthread right bra strap;Thread/unthread left bra strap;Thread/unthread right sleeve of pullover shirt/dresss;Put head through opening of pull over shirt/dress;Thread/unthread left sleeve of pullover shirt/dress;Pull shirt over trunk Upper body dressing/undressing: 4: Min-Patient completed 75 plus % of tasks FIM - Lower Body Dressing/Undressing Lower body dressing/undressing steps patient completed: Thread/unthread right underwear leg;Thread/unthread left underwear leg;Pull underwear up/down;Thread/unthread right pants leg;Thread/unthread left pants leg;Pull pants up/down;Don/Doff right sock;Don/Doff left sock;Don/Doff left shoe Lower body dressing/undressing: 4: Min-Patient completed 75 plus % of tasks  FIM - Toileting Toileting steps completed by patient: Adjust clothing prior to toileting;Performs perineal hygiene;Adjust clothing after toileting Toileting Assistive Devices: Grab bar or rail for support Toileting: 4: Steadying assist  FIM - Diplomatic Services operational officer Devices: Grab bars Toilet Transfers: 4-To toilet/BSC: Min A (steadying Pt. > 75%);4-From toilet/BSC: Min A (steadying Pt. > 75%)  FIM - Bed/Chair Transfer Bed/Chair Transfer Assistive Devices: Manufacturing systems engineer Transfer: 4:  Supine > Sit: Min A (steadying Pt. > 75%/lift 1 leg);4: Bed > Chair or W/C: Min A (steadying Pt. > 75%)  FIM - Locomotion: Wheelchair Locomotion: Wheelchair: 5: Travels 150 ft or more: maneuvers on rugs and over door sills with supervision, cueing or coaxing FIM - Locomotion: Ambulation Locomotion: Ambulation Assistive Devices: Designer, industrial/product Ambulation/Gait Assistance: 4: Min guard Locomotion: Ambulation: 1: Travels less than 50 ft with minimal assistance (Pt.>75%)  Comprehension Comprehension Mode: Auditory Comprehension: 7-Follows complex conversation/direction: With no assist  Expression Expression Mode: Verbal Expression: 7-Expresses complex ideas: With no assist  Social Interaction Social Interaction: 7-Interacts appropriately with others - No medications needed.  Problem Solving Problem Solving: 6-Solves complex problems: With extra time  Memory Memory Assistive Devices: Memory book Memory: 6-More than reasonable amt of time  Medical Problem List and Plan:  1. DVT Prophylaxis/Anticoagulation: Pharmaceutical: Xarelto stopped.  -dopplers negative  -scd's, teds 2. Pain Management: prn ultram. celebrex reduced to qday.   - scheduled hydrocodone for better control-   -muscle relaxants  -activity to tolerance. 3. Mood: Pleasant and appropriate with good overall outlook.    4. Neuropsych: This patient is capable of making decisions on his/her own behalf.  5. ABLA: continue iron supplement.  See below 6. OSA: CPAP with oxygen when asleep. Oxygen as needed when up 7. Recent sinusitis: augmentin completed 8. Asthma: Monitor for dyspnea with activity. Continue symbicort and Flonase.  -encourage IS  -OOB as much as possible 9. HTN:   Continue cozaar, and bystolic.  -resumed lasix  -prn hold parameters on meds for orthostasis  -encourage adequate fluids 10. Perioperative hematoma-  -s/p blood transfusion  -anticoagulation stopped  -blood count 9.1 today  -will not check  again until Monday as she's a difficult stick  -feels relatively comfortable, hemodynamically stable  -resumed activity as tolerated  -ortho contacted twice regarding her situation 11. Pre-renal azotemia: fluid resuscitation  -essentiall resolved  -resumed low dose lasix  LOS (Days) 8 A FACE TO FACE EVALUATION WAS PERFORMED  Tracey Nguyen T 11/10/2012 8:10 AM

## 2012-11-11 ENCOUNTER — Inpatient Hospital Stay (HOSPITAL_COMMUNITY): Payer: Medicare Other | Admitting: Physical Therapy

## 2012-11-11 DIAGNOSIS — G4733 Obstructive sleep apnea (adult) (pediatric): Secondary | ICD-10-CM

## 2012-11-11 DIAGNOSIS — S72009A Fracture of unspecified part of neck of unspecified femur, initial encounter for closed fracture: Secondary | ICD-10-CM

## 2012-11-11 DIAGNOSIS — D62 Acute posthemorrhagic anemia: Secondary | ICD-10-CM

## 2012-11-11 NOTE — Progress Notes (Signed)
Physical Therapy Session Note  Patient Details  Name: Tracey Nguyen MRN: [id number] Date of Birth: [birth year]  Today's Date: 11/11/2012 Time: 7829-5621 Time Calculation (min): 45 min  Short Term Goals: Week 2:   = unmet long term goals   Skilled Therapeutic Interventions/Progress Updates:    Wheelchair propulsion x 70' for generalized strengthening and endurance, cues to avoid holding breath during activity. Walker parts switched out to create a shorter walker to allow pt to better able bear weight through bil. UEs and avoid excessive weight bearing through Rt. LE. Ambulation up/down ramp x 2 reps with cues primarily for weight bearing precautions. Wheelchair pushups and pushups with hand grips 2 x 5 reps each for bil. UE strengthening. Practiced sit <> stand and short gait with one therapist placing hand under foot to assess how much weight going through foot, pt able to maintain precautions until fatigued.   SpO2>94% on 1L throughout session checked intermittently.  Therapy Documentation Precautions:  Precautions Precautions: Fall Restrictions Weight Bearing Restrictions: Yes RLE Weight Bearing: Touchdown weight bearing Other Position/Activity Restrictions: R LE Pain: Pain Assessment Pain Assessment: 0-10 Pain Score:   6 Pain Type: Surgical pain Pain Location: Hip Pain Orientation: Right Pain Descriptors: Sore Pain Frequency: Intermittent Pain Onset: With Activity Pain Intervention(s): RN made aware  See FIM for current functional status  Therapy/Group: Individual Therapy  Wilhemina Bonito 11/11/2012, 3:47 PM

## 2012-11-11 NOTE — Progress Notes (Signed)
CPAP is set up on auto titrate with pt. Home mask. Pt. States she is able to place mask on/off herself. Pt. Husband is in room and can assist if needed.  Pt. Encouraged to call RT if needed.

## 2012-11-11 NOTE — Progress Notes (Signed)
Patient ID: Tracey Nguyen, female   DOB: 12/14/1940, 72 y.o.   MRN: [id number] Subjective/Complaints: No new issues. Remains a little sob which is near her baseline. Pain RLE, hasn't had a pain pill since last noc A 12 point review of systems has been performed and if not noted above is otherwise negative.   Objective: Vital Signs: Blood pressure 103/69, pulse 66, temperature 98.1 F (36.7 C), temperature source Oral, resp. rate 18, height 4\' 9"  (1.448 m), weight 74.2 kg (163 lb 9.3 oz), SpO2 95.00%. No results found.  Recent Labs  11/09/12 0500 11/10/12 0620  HGB 9.5* 9.1*  HCT 29.2* 28.9*    Recent Labs  11/09/12 0500  NA 138  K 3.9  CL 98  GLUCOSE 106*  BUN 18  CREATININE 1.24*  CALCIUM 9.3   CBG (last 3)  No results found for this basename: GLUCAP,  in the last 72 hours  Wt Readings from Last 3 Encounters:  11/08/12 74.2 kg (163 lb 9.3 oz)  10/30/12 72.576 kg (160 lb)  10/30/12 72.576 kg (160 lb)    Physical Exam:  Constitutional: She is oriented to person, place, and time. She appears well-developed and well-nourished.  Morbidly obese  HENT:  Head: Normocephalic and atraumatic.  Eyes: Pupils are equal, round, and reactive to light.  Neck: Normal range of motion.  Cardiovascular: Normal rate and regular rhythm.  Pulmonary/Chest: Effort normal. Chest clear. No rales or rhonchi Abdominal: Bowel sounds are normal. Non distended. Non tender Musculoskeletal: She exhibits edema ( Right hip/thigh edema.-firm around op site, minimal drainage  ).  Neurological: She is alert and oriented to person, place, and time. Strength 4/5 UE. Lower ext 2-/5 RHF, 3-/5RKE, 4 ankle.---pain inhibition. LLE is 4 to 4+/5. No sensory deficits, DTR's 1+ Skin: Skin is warm and dry other than right hip wound which has mild serosanginous drainage. Wound is well approximated. Ext:  1+ edema R pretibial  Assessment/Plan: 1. Functional deficits secondary to failed right femur ORIF s/p  revision which require 3+ hours per day of interdisciplinary therapy in a comprehensive inpatient rehab setting. Physiatrist is providing close team supervision and 24 hour management of active medical problems listed below. Physiatrist and rehab team continue to assess barriers to discharge/monitor patient progress toward functional and medical goals. FIM: FIM - Bathing Bathing Steps Patient Completed: Chest;Right Arm;Left Arm;Abdomen;Front perineal area;Buttocks;Right upper leg;Left upper leg;Right lower leg (including foot);Left lower leg (including foot) Bathing: 4: Steadying assist  FIM - Upper Body Dressing/Undressing Upper body dressing/undressing steps patient completed: Thread/unthread right bra strap;Thread/unthread left bra strap;Thread/unthread right sleeve of pullover shirt/dresss;Put head through opening of pull over shirt/dress;Thread/unthread left sleeve of pullover shirt/dress;Pull shirt over trunk Upper body dressing/undressing: 4: Min-Patient completed 75 plus % of tasks FIM - Lower Body Dressing/Undressing Lower body dressing/undressing steps patient completed: Thread/unthread right underwear leg;Thread/unthread left underwear leg;Pull underwear up/down;Thread/unthread right pants leg;Thread/unthread left pants leg;Pull pants up/down;Don/Doff right sock;Don/Doff left sock;Don/Doff left shoe Lower body dressing/undressing: 4: Min-Patient completed 75 plus % of tasks  FIM - Toileting Toileting steps completed by patient: Adjust clothing prior to toileting;Performs perineal hygiene;Adjust clothing after toileting Toileting Assistive Devices: Grab bar or rail for support Toileting: 4: Steadying assist  FIM - Diplomatic Services operational officer Devices: Grab bars Toilet Transfers: 4-To toilet/BSC: Min A (steadying Pt. > 75%);4-From toilet/BSC: Min A (steadying Pt. > 75%)  FIM - Bed/Chair Transfer Bed/Chair Transfer Assistive Devices: Therapist, occupational: 4:  Supine > Sit: Min A (steadying Pt. > 75%/lift  1 leg);4: Bed > Chair or W/C: Min A (steadying Pt. > 75%)  FIM - Locomotion: Wheelchair Locomotion: Wheelchair: 5: Travels 150 ft or more: maneuvers on rugs and over door sills with supervision, cueing or coaxing FIM - Locomotion: Ambulation Locomotion: Ambulation Assistive Devices: Designer, industrial/product Ambulation/Gait Assistance: 4: Min guard Locomotion: Ambulation: 1: Travels less than 50 ft with minimal assistance (Pt.>75%)  Comprehension Comprehension Mode: Auditory Comprehension: 7-Follows complex conversation/direction: With no assist  Expression Expression Mode: Verbal Expression: 7-Expresses complex ideas: With no assist  Social Interaction Social Interaction: 7-Interacts appropriately with others - No medications needed.  Problem Solving Problem Solving: 6-Solves complex problems: With extra time  Memory Memory Assistive Devices: Memory book Memory: 6-More than reasonable amt of time  Medical Problem List and Plan:  1. DVT Prophylaxis/Anticoagulation: Pharmaceutical: Xarelto stopped.  -dopplers negative  -scd's, teds 2. Pain Management: prn ultram. celebrex reduced to qday.   - scheduled hydrocodone for better control-   -muscle relaxants  -activity to tolerance. 3. Mood: Pleasant and appropriate with good overall outlook.    4. Neuropsych: This patient is capable of making decisions on his/her own behalf.  5. ABLA: continue iron supplement.  See below 6. OSA: CPAP with oxygen when asleep. Oxygen as needed when up 7. Recent sinusitis: augmentin completed 8. Asthma: Monitor for dyspnea with activity. Continue symbicort and Flonase.  -encourage IS  -OOB as much as possible 9. HTN:   Continue cozaar, and bystolic.  -resumed lasix  -prn hold parameters on meds for orthostasis  -encourage adequate fluids 10. Perioperative hematoma-  -s/p blood transfusion  -anticoagulation stopped  -blood count 9.1 today  -will not check  again until Monday as she's a difficult stick  -feels relatively comfortable, hemodynamically stable  -resumed activity as tolerated  -ortho contacted twice regarding her situation 11. Pre-renal azotemia: fluid resuscitation  -essentiall resolved  -resumed low dose lasix  LOS (Days) 9 A FACE TO FACE EVALUATION WAS PERFORMED  Kem Parcher E 11/11/2012 10:09 AM

## 2012-11-12 ENCOUNTER — Inpatient Hospital Stay (HOSPITAL_COMMUNITY): Payer: Medicare Other | Admitting: *Deleted

## 2012-11-12 NOTE — Progress Notes (Signed)
Patient states she places CPAP on and off herself. CPAP is setup and ready to be placed on. Encouraged to call if she needs assistance. RT will continue to monitor.

## 2012-11-12 NOTE — Progress Notes (Signed)
Patient ID: Tracey Nguyen, female   DOB: [birth year], 72 y.o.   MRN: [id number] Subjective/Complaints: No new issues. Remains a little sob which is near her baseline.Right leg weak, R Hip pain when pain meds wear off A 12 point review of systems has been performed and if not noted above is otherwise negative.   Objective: Vital Signs: Blood pressure 118/68, pulse 87, temperature 98.2 F (36.8 C), temperature source Oral, resp. rate 18, height 4\' 9"  (1.448 m), weight 74.2 kg (163 lb 9.3 oz), SpO2 97.00%. No results found.  Recent Labs  11/10/12 0620  HGB 9.1*  HCT 28.9*   No results found for this basename: NA, K, CL, CO, GLUCOSE, BUN, CREATININE, CALCIUM,  in the last 72 hours CBG (last 3)  No results found for this basename: GLUCAP,  in the last 72 hours  Wt Readings from Last 3 Encounters:  11/08/12 74.2 kg (163 lb 9.3 oz)  10/30/12 72.576 kg (160 lb)  10/30/12 72.576 kg (160 lb)    Physical Exam:  Constitutional: She is oriented to person, place, and time. She appears well-developed and well-nourished.  Morbidly obese  HENT:  Head: Normocephalic and atraumatic.  Eyes: Pupils are equal, round, and reactive to light.  Neck: Normal range of motion.  Cardiovascular: Normal rate and regular rhythm.  Pulmonary/Chest: Effort normal. Chest clear. No rales or rhonchi Abdominal: Bowel sounds are normal. Non distended. Non tender Musculoskeletal: She exhibits edema ( Right hip/thigh edema.-firm around op site, minimal drainage  ).  Neurological: She is alert and oriented to person, place, and time. Strength 4/5 UE. Lower ext 2-/5 RHF, 3-/5RKE, 4 ankle.---pain inhibition. LLE is 4 to 4+/5. No sensory deficits, DTR's 1+ Skin: Skin is warm and dry other than right hip wound which has mild serosanginous drainage. Wound is well approximated. Ext:  1+ edema R pretibial  Assessment/Plan: 1. Functional deficits secondary to failed right femur ORIF s/p revision which require 3+ hours  per day of interdisciplinary therapy in a comprehensive inpatient rehab setting. Physiatrist is providing close team supervision and 24 hour management of active medical problems listed below. Physiatrist and rehab team continue to assess barriers to discharge/monitor patient progress toward functional and medical goals. FIM: FIM - Bathing Bathing Steps Patient Completed: Chest;Right Arm;Left Arm;Abdomen;Front perineal area;Buttocks;Right upper leg;Left upper leg;Right lower leg (including foot);Left lower leg (including foot) Bathing: 4: Steadying assist  FIM - Upper Body Dressing/Undressing Upper body dressing/undressing steps patient completed: Thread/unthread right bra strap;Thread/unthread left bra strap;Thread/unthread right sleeve of pullover shirt/dresss;Put head through opening of pull over shirt/dress;Thread/unthread left sleeve of pullover shirt/dress;Pull shirt over trunk Upper body dressing/undressing: 4: Min-Patient completed 75 plus % of tasks FIM - Lower Body Dressing/Undressing Lower body dressing/undressing steps patient completed: Thread/unthread right underwear leg;Thread/unthread left underwear leg;Pull underwear up/down;Thread/unthread right pants leg;Thread/unthread left pants leg;Pull pants up/down;Don/Doff right sock;Don/Doff left sock;Don/Doff left shoe Lower body dressing/undressing: 4: Min-Patient completed 75 plus % of tasks  FIM - Toileting Toileting steps completed by patient: Adjust clothing prior to toileting;Performs perineal hygiene;Adjust clothing after toileting Toileting Assistive Devices: Grab bar or rail for support Toileting: 4: Steadying assist  FIM - Diplomatic Services operational officer Devices: Grab bars Toilet Transfers: 4-To toilet/BSC: Min A (steadying Pt. > 75%);4-From toilet/BSC: Min A (steadying Pt. > 75%)  FIM - Bed/Chair Transfer Bed/Chair Transfer Assistive Devices: Bed rails Bed/Chair Transfer: 4: Chair or W/C > Bed: Min A  (steadying Pt. > 75%);4: Sit > Supine: Min A (steadying pt. > 75%/lift 1  leg)  FIM - Locomotion: Wheelchair Locomotion: Wheelchair: 2: Travels 50 - 149 ft with supervision, cueing or coaxing FIM - Locomotion: Ambulation Locomotion: Ambulation Assistive Devices: Designer, industrial/product Ambulation/Gait Assistance: 4: Min guard Locomotion: Ambulation: 1: Travels less than 50 ft with minimal assistance (Pt.>75%)  Comprehension Comprehension Mode: Auditory Comprehension: 7-Follows complex conversation/direction: With no assist  Expression Expression Mode: Verbal Expression: 7-Expresses complex ideas: With no assist  Social Interaction Social Interaction: 7-Interacts appropriately with others - No medications needed.  Problem Solving Problem Solving: 6-Solves complex problems: With extra time  Memory Memory Assistive Devices: Memory book Memory: 6-More than reasonable amt of time  Medical Problem List and Plan:  1. DVT Prophylaxis/Anticoagulation: Pharmaceutical: Xarelto stopped.  -dopplers negative  -scd's, teds 2. Pain Management: prn ultram. celebrex reduced to qday.   - scheduled hydrocodone for better control-   -muscle relaxants  -activity to tolerance. 3. Mood: Pleasant and appropriate with good overall outlook.    4. Neuropsych: This patient is capable of making decisions on his/her own behalf.  5. ABLA: continue iron supplement.  See below 6. OSA: CPAP with oxygen when asleep. Oxygen as needed when up 7. Recent sinusitis: augmentin completed 8. Asthma: Monitor for dyspnea with activity. Continue symbicort and Flonase.  -encourage IS  -OOB as much as possible 9. HTN:   Continue cozaar, and bystolic.  -resumed lasix  -prn hold parameters on meds for orthostasis  -encourage adequate fluids 10. Perioperative hematoma-  -s/p blood transfusion  -anticoagulation stopped  -blood count 9.1 4/4  -will not check again until Monday as she's a difficult stick  -feels relatively  comfortable, hemodynamically stable  -resumed activity as tolerated  -ortho contacted twice regarding her situation 11. Pre-renal azotemia: s/p fluid resuscitation  -essentiall resolved  -resumed low dose lasix  LOS (Days) 10 A FACE TO FACE EVALUATION WAS PERFORMED  Claudette Laws E 11/12/2012 9:21 AM

## 2012-11-12 NOTE — Progress Notes (Signed)
OccupationalTherapy Note  Patient Details  Name: Tracey Nguyen MRN: [id number] Date of Birth: 1940/11/18 Today's Date: 11/12/2012  Time:  1445-1540  (55 min) Pain:none Individual session  Engaged in wc mobility from room to gym with supervision.  Engaged in standing balance activity with lateral reaching, bending, passing cups from one hand to another.  Pt. Oxygen level went as low as 87%, but recovered to 91% after 1 min. Pt. Required rest break after each activity of 1 minute and then rest for 1 minute.      Humberto Seals 11/12/2012, 3:27 PM

## 2012-11-13 ENCOUNTER — Inpatient Hospital Stay (HOSPITAL_COMMUNITY): Payer: Medicare Other | Admitting: Physical Therapy

## 2012-11-13 ENCOUNTER — Inpatient Hospital Stay (HOSPITAL_COMMUNITY): Payer: Medicare Other

## 2012-11-13 LAB — CBC
Hemoglobin: 9 g/dL — ABNORMAL LOW (ref 12.0–15.0)
MCH: 23.1 pg — ABNORMAL LOW (ref 26.0–34.0)
MCHC: 31.9 g/dL (ref 30.0–36.0)
Platelets: 226 10*3/uL (ref 150–400)
RDW: 21.4 % — ABNORMAL HIGH (ref 11.5–15.5)

## 2012-11-13 LAB — BASIC METABOLIC PANEL
Calcium: 9.4 mg/dL (ref 8.4–10.5)
GFR calc Af Amer: 50 mL/min — ABNORMAL LOW (ref >90)
GFR calc non Af Amer: 43 mL/min — ABNORMAL LOW (ref >90)
Glucose, Bld: 101 mg/dL — ABNORMAL HIGH (ref 70–99)
Sodium: 140 mEq/L (ref 135–145)

## 2012-11-13 NOTE — Progress Notes (Signed)
Physical Therapy Session Note  Patient Details  Name: Tracey Nguyen MRN: [id number] Date of Birth: [birth year]  Today's Date: 11/13/2012 Time: 0730-0828 Time Calculation (min): 58 min  Short Term Goals: Week 2:   = unmet long term goals  Skilled Therapeutic Interventions/Progress Updates:    Pt sitting edge of bed eating breakfast at start of treatment. Session focused on wheelchair mobility, propulsion  (~150' with 5 rest breaks), wheelchair set up for transfers and bed mobility in simulated home environment. Pt requires mod to max verbal cues for wheelchair positioning (decreased recall of previous education), requires assist for leg rest management. Pt required verbal cues x 2 for brakes before standing during session. Pt continues to require min assist to get Rt. LE into ADL bed secondary to weakness and fatigue, she has significant difficulty with this task even with use of leg lifter. She reports "if I can't do this I may need to go to a nursing home."  Pt continues to have some difficulty problem solving through mobility and needs supervision cues. Goals updated due to progress.  Pt's doorways are 26.5" wide and the height of her bed is 21".  SpO2 down to 87% at lowest during session while on room air, able to pull up to 94% on room air with rest.    Therapy Documentation Precautions:  Precautions Precautions: Fall Restrictions Weight Bearing Restrictions: Yes RLE Weight Bearing: Touchdown weight bearing Other Position/Activity Restrictions: R LE Pain: Pain Assessment Pain Assessment: No/denies pain at start of treatment, reports she has been premedicated  See FIM for current functional status  Therapy/Group: Individual Therapy  Wilhemina Bonito 11/13/2012, 12:10 PM

## 2012-11-13 NOTE — Progress Notes (Signed)
Patient ID: Tracey Nguyen, female   DOB: 01/19/1941, 72 y.o.   MRN: [id number] Subjective/Complaints: Feels that breathing has been better. Right hip still is tight and tender.   12 point review of systems has been performed and if not noted above is otherwise negative.   Objective: Vital Signs: Blood pressure 119/89, pulse 71, temperature 98.4 F (36.9 C), temperature source Oral, resp. rate 18, height 4\' 9"  (1.448 m), weight 74.2 kg (163 lb 9.3 oz), SpO2 97.00%. No results found.  Recent Labs  11/13/12 0632  WBC 11.1*  HGB 9.0*  HCT 28.2*  PLT 226    Recent Labs  11/13/12 0632  NA 140  K 4.2  CL 101  GLUCOSE 101*  BUN 17  CREATININE 1.22*  CALCIUM 9.4   CBG (last 3)  No results found for this basename: GLUCAP,  in the last 72 hours  Wt Readings from Last 3 Encounters:  11/08/12 74.2 kg (163 lb 9.3 oz)  10/30/12 72.576 kg (160 lb)  10/30/12 72.576 kg (160 lb)    Physical Exam:  Constitutional: She is oriented to person, place, and time. She appears well-developed and well-nourished.  Morbidly obese  HENT:  Head: Normocephalic and atraumatic.  Eyes: Pupils are equal, round, and reactive to light.  Neck: Normal range of motion.  Cardiovascular: Normal rate and regular rhythm.  Pulmonary/Chest: Effort normal. Chest clear. No rales or rhonchi Abdominal: Bowel sounds are normal. Non distended. Non tender Musculoskeletal: She exhibits edema ( Right hip/thigh edema.-firm around op site, minimal drainage  ).  Neurological: She is alert and oriented to person, place, and time. Strength 4/5 UE. Lower ext 2-/5 RHF, 3-/5RKE, 4 ankle.---pain inhibition. LLE is 4 to 4+/5. No sensory deficits, DTR's 1+ Skin: Skin is warm and dry other than right hip wound which has mild serosanginous drainage. Wound is well approximated. Ext:  1+ edema R pretibial  Assessment/Plan: 1. Functional deficits secondary to failed right femur ORIF s/p revision which require 3+ hours per day  of interdisciplinary therapy in a comprehensive inpatient rehab setting. Physiatrist is providing close team supervision and 24 hour management of active medical problems listed below. Physiatrist and rehab team continue to assess barriers to discharge/monitor patient progress toward functional and medical goals. FIM: FIM - Bathing Bathing Steps Patient Completed: Chest;Right Arm;Left Arm;Abdomen;Front perineal area;Buttocks;Right upper leg;Left upper leg;Right lower leg (including foot);Left lower leg (including foot) Bathing: 4: Steadying assist  FIM - Upper Body Dressing/Undressing Upper body dressing/undressing steps patient completed: Thread/unthread right bra strap;Thread/unthread left bra strap;Thread/unthread right sleeve of pullover shirt/dresss;Put head through opening of pull over shirt/dress;Thread/unthread left sleeve of pullover shirt/dress;Pull shirt over trunk Upper body dressing/undressing: 4: Min-Patient completed 75 plus % of tasks FIM - Lower Body Dressing/Undressing Lower body dressing/undressing steps patient completed: Thread/unthread right underwear leg;Thread/unthread left underwear leg;Pull underwear up/down;Thread/unthread right pants leg;Thread/unthread left pants leg;Pull pants up/down;Don/Doff right sock;Don/Doff left sock;Don/Doff left shoe Lower body dressing/undressing: 4: Min-Patient completed 75 plus % of tasks  FIM - Toileting Toileting steps completed by patient: Adjust clothing prior to toileting;Performs perineal hygiene;Adjust clothing after toileting Toileting Assistive Devices: Grab bar or rail for support Toileting: 4: Steadying assist  FIM - Diplomatic Services operational officer Devices: Grab bars Toilet Transfers: 4-To toilet/BSC: Min A (steadying Pt. > 75%);4-From toilet/BSC: Min A (steadying Pt. > 75%)  FIM - Banker Devices: Sliding board Bed/Chair Transfer: 4: Bed > Chair or W/C: Min A (steadying  Pt. > 75%)  FIM -  Locomotion: Wheelchair Locomotion: Wheelchair: 2: Travels 50 - 149 ft with supervision, cueing or coaxing FIM - Locomotion: Ambulation Locomotion: Ambulation Assistive Devices: Designer, industrial/product Ambulation/Gait Assistance: 4: Min guard Locomotion: Ambulation: 1: Travels less than 50 ft with minimal assistance (Pt.>75%)  Comprehension Comprehension Mode: Auditory Comprehension: 7-Follows complex conversation/direction: With no assist  Expression Expression Mode: Verbal Expression: 7-Expresses complex ideas: With no assist  Social Interaction Social Interaction: 7-Interacts appropriately with others - No medications needed.  Problem Solving Problem Solving: 6-Solves complex problems: With extra time  Memory Memory Assistive Devices: Memory book Memory: 6-More than reasonable amt of time  Medical Problem List and Plan:  1. DVT Prophylaxis/Anticoagulation: Pharmaceutical: Xarelto stopped.  -dopplers negative  -scd's, teds 2. Pain Management: prn ultram. celebrex reduced to qday.   - scheduled hydrocodone for better control-   -muscle relaxants  -activity to tolerance. 3. Mood: Pleasant and appropriate with good overall outlook.    4. Neuropsych: This patient is capable of making decisions on his/her own behalf.  5. ABLA: continue iron supplement.  See below 6. OSA: CPAP with oxygen when asleep. Oxygen as needed when up  -seems to be doing better with activity tolerance 7. Recent sinusitis: augmentin completed 8. Asthma: Monitor for dyspnea with activity. Continue symbicort and Flonase.  -encourage IS  -OOB as much as possible 9. HTN:   Continue cozaar, and bystolic.  -resumed lasix  -prn hold parameters on meds for orthostasis  -encourage adequate fluids 10. Perioperative hematoma-  -s/p blood transfusion  -anticoagulation stopped  -blood count 9.0 today, and has remained stable.  -recheck Wednesday.  -feels relatively comfortable, hemodynamically  stable  -full activity as tolerated  -ortho contacted twice regarding her situation 11. Pre-renal azotemia: s/p fluid resuscitation  -essentiall resolved  -resumed low dose lasix  -recheck wednesday  LOS (Days) 11 A FACE TO FACE EVALUATION WAS PERFORMED  Shana Younge T 11/13/2012 8:24 AM

## 2012-11-13 NOTE — Progress Notes (Signed)
Occupational Therapy Session Note  Patient Details  Name: Tracey Nguyen MRN: [id number] Date of Birth: 05-15-1941  Today's Date: 11/13/2012 Time: 0901-0959 Time Calculation (min): 58 min  Short Term Goals: Week 2:     Skilled Therapeutic Interventions/Progress Updates:    Pt seated in w/c upon arrival.  Pt engaged in bathing and dressing tasks sit<>stand at w/c level.  Pt uses AE appropriately to assist with bathing and dressing tasks.  Pt required multiple rest breaks throughout session.  Pt's O2 sats >90% on RA during session.  Focus on activity tolerance, dynamic standing balance, w/c setup, AE use, and safety awareness.  Therapy Documentation Precautions:  Precautions Precautions: Fall Restrictions Weight Bearing Restrictions: Yes RLE Weight Bearing: Touchdown weight bearing Other Position/Activity Restrictions: R LE   Pain: Pain Assessment Pain Assessment: No/denies pain  See FIM for current functional status  Therapy/Group: Individual Therapy  [name] 11/13/2012, 10:01 AM

## 2012-11-13 NOTE — Progress Notes (Signed)
Physical Therapy Session Note  Patient Details  Name: Tracey Nguyen MRN: [id number] Date of Birth: 08-08-1941  Today's Date: 11/13/2012 Time: 1400-1445 Time Calculation (min): 45 min  Short Term Goals: Week 2:     Skilled Therapeutic Interventions/Progress Updates:    Pt propelled wheelchair x 120' with supervision for strengthening and activity tolerance. Wheelchair pushups 4 x 5 reps with cues for breathing. Standing balance on Lt. LE with Rt. Hip abduction then extension 3 x 10 reps each with cues for decreased bil. UE reliance. Room negotiation with RW with supervision, bed mobiltiy with leg lifter and min assist. Practiced throughout treatment on wheelchair parts management, pt continues to have quite a bit of difficulty finding brakes and managing leg rests.   SpO2 down to 88% once but returned quickly >94% on room air with rest.  Therapy Documentation Precautions:  Precautions Precautions: Fall Restrictions Weight Bearing Restrictions: Yes RLE Weight Bearing: Touchdown weight bearing Other Position/Activity Restrictions: R LE Pain: Pain Assessment Pain Score:   5 Pain Type: Surgical pain Pain Location: Hip Pain Orientation: Right Pain Descriptors: Aching Pain Onset: With Activity Pain Intervention(s): RN made aware  See FIM for current functional status  Therapy/Group: Individual Therapy  Wilhemina Bonito 11/13/2012, 3:22 PM

## 2012-11-13 NOTE — Progress Notes (Signed)
Physical Therapy Note  Patient Details  Name: Tracey Nguyen MRN: [id number] Date of Birth: 05/14/1941 Today's Date: 11/13/2012  1135-1200 (25 minutes) individual Pain: 2/10 RT hip /premedicated Focus of treatment: bilateral LE AROM/strengthening in supine/standing Treatment: wc setup- continues to need reminder cues for both brakes and tactile cues to swing away legrests; transfer RW SBA TDWB RT LE;  sit to supine (mat) min assist RT LE; supine to sit min assist RT LE (pt now able to perform without assisting trunk); bilateral LE AROM/strengthening 2 X 10 - heel slides (min AA on right), ankle pumps, SAQs.   Marilene Vath,JIM 11/13/2012, 11:50 AM

## 2012-11-13 NOTE — Progress Notes (Signed)
RT has hooked CPAP to oxygen 2 lpm bleed in, ready for patient use. PT states she can put her mask on and turn the machine on when she is ready to use it herself. RT advised PT to call if she needs any help when she's ready to go on the machine. RT will assist as needed.

## 2012-11-13 NOTE — Progress Notes (Signed)
Patient states she places CPAP on and off herself. CPAP is setup and ready to be placed on. Encouraged to call if she needs assistance. RT will continue to monitor. 

## 2012-11-14 ENCOUNTER — Inpatient Hospital Stay (HOSPITAL_COMMUNITY): Payer: Medicare Other

## 2012-11-14 ENCOUNTER — Inpatient Hospital Stay (HOSPITAL_COMMUNITY): Payer: Medicare Other | Admitting: Physical Therapy

## 2012-11-14 MED ORDER — IPRATROPIUM-ALBUTEROL 20-100 MCG/ACT IN AERS
1.0000 | INHALATION_SPRAY | Freq: Two times a day (BID) | RESPIRATORY_TRACT | Status: DC
  Administered 2012-11-14 – 2012-11-16 (×4): 1 via RESPIRATORY_TRACT
  Filled 2012-11-14: qty 4

## 2012-11-14 NOTE — Progress Notes (Signed)
Patient ID: Tracey Nguyen, female   DOB: [birth year], 72 y.o.   MRN: [id number] Subjective/Complaints: Had a good night. Was stiff in the am yesterday but improved as the day progressed. Breathing well. No oxygen this am.   12 point review of systems has been performed and if not noted above is otherwise negative.   Objective: Vital Signs: Blood pressure 117/73, pulse 72, temperature 98.1 F (36.7 C), temperature source Oral, resp. rate 18, height 4\' 9"  (1.448 m), weight 74.2 kg (163 lb 9.3 oz), SpO2 99.00%. No results found.  Recent Labs  11/13/12 0632  WBC 11.1*  HGB 9.0*  HCT 28.2*  PLT 226    Recent Labs  11/13/12 0632  NA 140  K 4.2  CL 101  GLUCOSE 101*  BUN 17  CREATININE 1.22*  CALCIUM 9.4   CBG (last 3)  No results found for this basename: GLUCAP,  in the last 72 hours  Wt Readings from Last 3 Encounters:  11/08/12 74.2 kg (163 lb 9.3 oz)  10/30/12 72.576 kg (160 lb)  10/30/12 72.576 kg (160 lb)    Physical Exam:  Constitutional: She is oriented to person, place, and time. She appears well-developed and well-nourished.  Morbidly obese  HENT:  Head: Normocephalic and atraumatic.  Eyes: Pupils are equal, round, and reactive to light.  Neck: Normal range of motion.  Cardiovascular: Normal rate and regular rhythm.  Pulmonary/Chest: Effort normal. Chest clear. No rales or rhonchi Abdominal: Bowel sounds are normal. Non distended. Non tender Musculoskeletal: She exhibits edema ( Right hip/thigh edema.-firm around op site, minimal drainage  ).  Neurological: She is alert and oriented to person, place, and time. Strength 4/5 UE. Lower ext 2-/5 RHF, 3-/5RKE, 4 ankle.---pain inhibition. LLE is 4 to 4+/5. No sensory deficits, DTR's 1+ Skin: Skin is warm and dry other than right hip wound which has mild serosanginous drainage. Wound is well approximated. Ext:  1+ edema R pretibial  Assessment/Plan: 1. 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 Physiatrist is providing close team supervision and 24 hour management of active medical problems listed below. Physiatrist and rehab team continue to assess barriers to discharge/monitor patient progress toward functional and medical goals. FIM: FIM - Bathing Bathing Steps Patient Completed: Chest;Right Arm;Left Arm;Abdomen;Front perineal area;Buttocks;Left lower leg (including foot);Right lower leg (including foot);Left upper leg;Right upper leg Bathing: 5: Supervision: Safety issues/verbal cues  FIM - Upper Body Dressing/Undressing Upper body dressing/undressing steps patient completed: Thread/unthread right bra strap;Thread/unthread left bra strap;Hook/unhook bra;Thread/unthread right sleeve of pullover shirt/dresss;Thread/unthread left sleeve of pullover shirt/dress;Put head through opening of pull over shirt/dress;Pull shirt over trunk Upper body dressing/undressing: 5: Set-up assist to: Obtain clothing/put away FIM - Lower Body Dressing/Undressing Lower body dressing/undressing steps patient completed: Thread/unthread right underwear leg;Thread/unthread left underwear leg;Pull underwear up/down;Thread/unthread right pants leg;Thread/unthread left pants leg;Pull pants up/down;Fasten/unfasten pants;Don/Doff right sock;Don/Doff left sock Lower body dressing/undressing: 5: Supervision: Safety issues/verbal cues  FIM - Toileting Toileting steps completed by patient: Adjust clothing prior to toileting;Performs perineal hygiene;Adjust clothing after toileting Toileting Assistive Devices: Grab bar or rail for support Toileting: 4: Steadying assist  FIM - Diplomatic Services operational officer Devices: Grab bars Toilet Transfers: 4-To toilet/BSC: Min A (steadying Pt. > 75%);4-From toilet/BSC: Min A (steadying Pt. > 75%)  FIM - Banker  Devices: Sliding board Bed/Chair Transfer: 5: Supine > Sit: Supervision (verbal cues/safety issues);4:  Sit > Supine: Min A (steadying pt. > 75%/lift 1 leg);5: Bed > Chair or W/C: Supervision (verbal cues/safety issues);5: Chair or W/C > Bed: Supervision (verbal cues/safety issues)  FIM - Locomotion: Wheelchair Locomotion: Wheelchair: 5: Travels 150 ft or more: maneuvers on rugs and over door sills with supervision, cueing or coaxing FIM - Locomotion: Ambulation Locomotion: Ambulation Assistive Devices: Designer, industrial/product Ambulation/Gait Assistance: 4: Min guard Locomotion: Ambulation: 1: Travels less than 50 ft with minimal assistance (Pt.>75%)  Comprehension Comprehension Mode: Auditory Comprehension: 7-Follows complex conversation/direction: With no assist  Expression Expression Mode: Verbal Expression: 7-Expresses complex ideas: With no assist  Social Interaction Social Interaction: 7-Interacts appropriately with others - No medications needed.  Problem Solving Problem Solving: 6-Solves complex problems: With extra time  Memory Memory Assistive Devices: Memory book Memory: 6-More than reasonable amt of time  Medical Problem List and Plan:  1. DVT Prophylaxis/Anticoagulation: Pharmaceutical: Xarelto stopped.  -dopplers negative  -scd's, teds 2. Pain Management: prn ultram. celebrex reduced to qday.   - scheduled hydrocodone for better control-   -muscle relaxants  -activity to tolerance. 3. Mood: Pleasant and appropriate with good overall outlook.    4. Neuropsych: This patient is capable of making decisions on his/her own behalf.  5. ABLA: continue iron supplement.  See below 6. OSA: CPAP with oxygen when asleep. Oxygen as needed when up  -seems to be doing better with activity tolerance 7. Recent sinusitis: augmentin completed 8. Asthma: Monitor for dyspnea with activity. Continue symbicort and Flonase.  -encourage IS  -OOB as much as possible 9. HTN:   Continue cozaar,  and bystolic.  -resumed lasix  -prn hold parameters on meds for orthostasis  -encourage adequate fluids 10. Perioperative hematoma-  -s/p blood transfusion  -anticoagulation stopped  -blood count 9.0 today, and has remained stable.  -recheck Wednesday.  -feels relatively comfortable, hemodynamically stable  -full activity as tolerated  -tends to feel a bit looser once she gets moving in am. 11. Pre-renal azotemia: s/p fluid resuscitation  -essentiall resolved  -resumed low dose lasix  -recheck wednesday  LOS (Days) 12 A FACE TO FACE EVALUATION WAS PERFORMED  Deyonna Fitzsimmons T 11/14/2012 8:10 AM

## 2012-11-14 NOTE — Progress Notes (Signed)
Physical Therapy Session Note  Patient Details  Name: Tracey Nguyen MRN: [id number] Date of Birth: Apr 15, 1941  Today's Date: 11/14/2012 Time: 1005-1030 Time Calculation (min): 25 min  Short Term Goals: Week 2:   = unmet long term goals  Skilled Therapeutic Interventions/Progress Updates:    Wheelchair propulsion x 120', 24' with supervision for upper extremity strengthening. Wheelchair parts management with min verbal cues, pt improving with remembering how to use leg rests. Standing balance on Lt. LE working single limb stance with one UE support while doing word finding activity, min-guard assist. Pt able to stand ~2 min at a time before needing rest.   Therapy Documentation Precautions:  Precautions Precautions: Fall Restrictions Weight Bearing Restrictions: Yes RLE Weight Bearing: Touchdown weight bearing Other Position/Activity Restrictions: R LE Pain: Pain Assessment Pain Assessment: No/denies pain (premedicated)  See FIM for current functional status  Therapy/Group: Individual Therapy  Wilhemina Bonito 11/14/2012, 12:16 PM

## 2012-11-14 NOTE — Progress Notes (Signed)
Occupational Therapy Session Note  Patient Details  Name: Tracey Nguyen MRN: [id number] Date of Birth: [birth year]  Today's Date: 11/14/2012  Session 1 Time: 0700-0755 Time Calculation (min): 55 min  Short Term Goals: Week 2:   STG=LTG (Supervision overall)  Skilled Therapeutic Interventions/Progress Updates:    Pt engaged in bathing and dressing tasks sit<>stand from w/c at sink.  Pt uses AE appropriately to assist with LB bathing and dressing.  Pt O2 sats>90% on RA throughout session.  Pt required multiple rest breaks throughout session.  Focus on dynamic standing balance, activity tolerance, and safety awareness.  Therapy Documentation Precautions:  Precautions Precautions: Fall Restrictions Weight Bearing Restrictions: Yes RLE Weight Bearing: Touchdown weight bearing Other Position/Activity Restrictions: R LE  Pain: Pain Assessment Pain Assessment: No/denies pain  See FIM for current functional status  Therapy/Group: Individual Therapy  Session 2 Time: 1100-1145 Pt denies pain Individual therapy Pt practiced tub bench transfers X 4 in tub room.  Pt required min A for lifting RLE out of tub. Pt engaged in w/c mobility in cluttered environment at supervision level.  Pt O2 sats >90% on RA but required multiple rest breaks during session.  [name] 11/14/2012, 7:57 AM

## 2012-11-14 NOTE — Progress Notes (Signed)
Physical Therapy Session Note  Patient Details  Name: Tracey Nguyen MRN: [id number] Date of Birth: 1941-03-20  Today's Date: 11/14/2012 Time: 4098-1191 Time Calculation (min): 31 min  Short Term Goals: Week 1:  PT Short Term Goal 1 (Week 1): Pt will ambulate x 50' with supervision and LRAD. PT Short Term Goal 1 - Progress (Week 1): Not met PT Short Term Goal 2 (Week 1): Pt will transfer to variety of surfaces with supervision PT Short Term Goal 2 - Progress (Week 1): Partly met PT Short Term Goal 3 (Week 1): Pt will ambulation up/down ramp with supervision and LRAD.  PT Short Term Goal 3 - Progress (Week 1): Partly met  Skilled Therapeutic Interventions/Progress Updates:   Patient reporting need to urinate.  Patient performed transfers stand pivot w/c <> toilet with UE support on grab bar and stood for toileting tasks with supervision overall and verbal cues to attend to RLE WB status.  Discussed with patient upcoming D/C, overall goals and areas of continued focus for therapy.  Patient reports that she would like to work on UE strength for gait and transfers.  Performed stand pivot transfers mat <> w/c with UE support on RW and supervision for WB status adherence.  Performed 2 sets x 10 reps bilat UE push ups with push up blocks with verbal cues for shoulder depression prior to elbow extension for increased Latissimus activation.  Required one rest break to assess Sp02.  Continued strengthening with LLE strengthening during single leg sit > stand from elevated mat with RLE extended 2 sets x 5 reps; patient noted to be holding her breath and causing Sp02 to drop to 84%; educated on pursed lip breathing and counting during exercises to maintain perfusion.  Patient able to perform 2 sets with one rest break to assess Sp02.  Tolerated well.  Returned to room to rest prior to next PT session.  Therapy Documentation Precautions:  Precautions Precautions: Fall Restrictions Weight Bearing  Restrictions: Yes RLE Weight Bearing: Touchdown weight bearing Other Position/Activity Restrictions: R LE Vital Signs: Oxygen Therapy SpO2: 91 % (84% with sit <> stand exercise; educated on pursed lip breathing with increased exhalation and able to bring Sp02 back up to 93% on RA with pursed lip breathing) O2 Device: None (Room air) Pain: Pain Assessment Pain Assessment: No/denies pain (premedicated)   See FIM for current functional status  Therapy/Group: Individual Therapy  Edman Circle [facility] 11/14/2012, 10:36 AM

## 2012-11-14 NOTE — Progress Notes (Signed)
Physical Therapy Session Note  Patient Details  Name: Tracey Nguyen MRN: [id number] Date of Birth: [birth year]  Today's Date: 11/14/2012 Time: 7829-5621 Time Calculation (min): 37 min  Skilled Therapeutic Interventions/Progress Updates:    Supine >sit with leg lifter from hospital bed, supervision however pt requires significant extra time and effort. Simulated home environment activities. Pt negotiated wheelchair positioning, set up (managing leg rests and brakes), and stand pivot transfer to bed with supervision. Pt requires min verbal cues and intermittent help with leg rests. Bed mobility in ADL bed supervision with increased time and use of leg lifter. Wheelchair mobility in ADL apartment negotiating tight spaces, gathering items from fridge, turning wheelchair around in tight spaces. Discussed ways to make pt more independent including moving items down from cabinets to counter height. Pt continues to need and will need supervision at D/C for standing activities and cues/assist with wheelchair parts management.   Therapy Documentation Precautions:  Precautions Precautions: Fall Restrictions Weight Bearing Restrictions: Yes RLE Weight Bearing: Touchdown weight bearing Other Position/Activity Restrictions: R LE Pain: Pain Assessment Pain Score:   4 Pain Type: Surgical pain Pain Location: Hip Pain Orientation: Right Pain Descriptors: Aching Pain Onset: With Activity Pain Intervention(s): RN made aware   See FIM for current functional status  Therapy/Group: Individual Therapy  Wilhemina Bonito 11/14/2012, 3:22 PM

## 2012-11-15 ENCOUNTER — Inpatient Hospital Stay (HOSPITAL_COMMUNITY): Payer: Medicare Other | Admitting: Physical Therapy

## 2012-11-15 ENCOUNTER — Inpatient Hospital Stay (HOSPITAL_COMMUNITY): Payer: Medicare Other

## 2012-11-15 ENCOUNTER — Inpatient Hospital Stay (HOSPITAL_COMMUNITY): Payer: Medicare Other | Admitting: Occupational Therapy

## 2012-11-15 LAB — BASIC METABOLIC PANEL
BUN: 18 mg/dL (ref 6–23)
GFR calc Af Amer: 45 mL/min — ABNORMAL LOW (ref >90)
GFR calc non Af Amer: 39 mL/min — ABNORMAL LOW (ref >90)
Potassium: 4.6 mEq/L (ref 3.5–5.1)

## 2012-11-15 LAB — CBC
Hemoglobin: 9.1 g/dL — ABNORMAL LOW (ref 12.0–15.0)
MCHC: 31.7 g/dL (ref 30.0–36.0)
RDW: 21.1 % — ABNORMAL HIGH (ref 11.5–15.5)
WBC: 10.6 10*3/uL — ABNORMAL HIGH (ref 4.0–10.5)

## 2012-11-15 NOTE — Progress Notes (Signed)
Patient ID: ARIENNE GARTIN, female   DOB: [birth year], 72 y.o.   MRN: [id number] Subjective/Complaints: Gradually feeling better. Excited for dc home.   12 point review of systems has been performed and if not noted above is otherwise negative.   Objective: Vital Signs: Blood pressure 112/72, pulse 66, temperature 98.2 F (36.8 C), temperature source Oral, resp. rate 18, height 4\' 9"  (1.448 m), weight 74.2 kg (163 lb 9.3 oz), SpO2 92.00%. No results found.  Recent Labs  11/13/12 0632 11/15/12 0607  WBC 11.1* 10.6*  HGB 9.0* 9.1*  HCT 28.2* 28.7*  PLT 226 262    Recent Labs  11/13/12 0632 11/15/12 0607  NA 140 139  K 4.2 4.6  CL 101 101  GLUCOSE 101* 104*  BUN 17 18  CREATININE 1.22* 1.33*  CALCIUM 9.4 9.6   CBG (last 3)  No results found for this basename: GLUCAP,  in the last 72 hours  Wt Readings from Last 3 Encounters:  11/08/12 74.2 kg (163 lb 9.3 oz)  10/30/12 72.576 kg (160 lb)  10/30/12 72.576 kg (160 lb)    Physical Exam:  Constitutional: She is oriented to person, place, and time. She appears well-developed and well-nourished.  Morbidly obese  HENT:  Head: Normocephalic and atraumatic.  Eyes: Pupils are equal, round, and reactive to light.  Neck: Normal range of motion.  Cardiovascular: Normal rate and regular rhythm.  Pulmonary/Chest: Effort normal. Chest clear. No rales or rhonchi Abdominal: Bowel sounds are normal. Non distended. Non tender Musculoskeletal: She exhibits edema ( Right hip/thigh edema.-firm around op site, minimal drainage  ).  Neurological: She is alert and oriented to person, place, and time. Strength 4/5 UE. Lower ext 2-/5 RHF, 3-/5RKE, 4 ankle.---pain inhibition. LLE is 4 to 4+/5. No sensory deficits, DTR's 1+ Skin: Skin is warm and dry other than right hip wound which has mild serosanginous drainage. Wound is well approximated. Ext:  1+ edema R pretibial  Assessment/Plan: 1. Functional deficits secondary to failed right  femur ORIF s/p revision which require 3+ hours per day of interdisciplinary therapy in a comprehensive inpatient rehab setting. Physiatrist is providing close team supervision and 24 hour management of active medical problems listed below. Physiatrist and rehab team continue to assess barriers to discharge/monitor patient progress toward functional and medical goals. FIM: FIM - Bathing Bathing Steps Patient Completed: Chest;Right Arm;Left Arm;Abdomen;Front perineal area;Buttocks;Left lower leg (including foot);Right lower leg (including foot);Left upper leg;Right upper leg Bathing: 5: Supervision: Safety issues/verbal cues  FIM - Upper Body Dressing/Undressing Upper body dressing/undressing steps patient completed: Thread/unthread right bra strap;Thread/unthread left bra strap;Hook/unhook bra;Thread/unthread right sleeve of pullover shirt/dresss;Put head through opening of pull over shirt/dress;Thread/unthread left sleeve of pullover shirt/dress;Pull shirt over trunk Upper body dressing/undressing: 6: More than reasonable amount of time FIM - Lower Body Dressing/Undressing Lower body dressing/undressing steps patient completed: Thread/unthread right underwear leg;Thread/unthread left underwear leg;Pull underwear up/down;Thread/unthread right pants leg;Thread/unthread left pants leg;Pull pants up/down;Fasten/unfasten pants;Don/Doff right sock;Don/Doff left sock Lower body dressing/undressing: 5: Supervision: Safety issues/verbal cues  FIM - Toileting Toileting steps completed by patient: Adjust clothing prior to toileting;Performs perineal hygiene;Adjust clothing after toileting Toileting Assistive Devices: Grab bar or rail for support Toileting: 5: Supervision: Safety issues/verbal cues  FIM - Diplomatic Services operational officer Devices: Grab bars Toilet Transfers: 5-To toilet/BSC: Supervision (verbal cues/safety issues);5-From toilet/BSC: Supervision (verbal cues/safety issues)  FIM  - Bed/Chair Transfer Bed/Chair Transfer Assistive Devices: Therapist, occupational: 5: Supine > Sit: Supervision (verbal cues/safety issues);5: Sit > Supine:  Supervision (verbal cues/safety issues);5: Bed > Chair or W/C: Supervision (verbal cues/safety issues);5: Chair or W/C > Bed: Supervision (verbal cues/safety issues)  FIM - Locomotion: Wheelchair Locomotion: Wheelchair: 2: Travels 50 - 149 ft with supervision, cueing or coaxing FIM - Locomotion: Ambulation Locomotion: Ambulation Assistive Devices: Designer, industrial/product Ambulation/Gait Assistance: 5: Supervision Locomotion: Ambulation: 1: Travels less than 50 ft with supervision/safety issues  Comprehension Comprehension Mode: Auditory Comprehension: 7-Follows complex conversation/direction: With no assist  Expression Expression Mode: Verbal Expression: 7-Expresses complex ideas: With no assist  Social Interaction Social Interaction: 7-Interacts appropriately with others - No medications needed.  Problem Solving Problem Solving: 6-Solves complex problems: With extra time  Memory Memory Assistive Devices: Memory book Memory: 6-More than reasonable amt of time  Medical Problem List and Plan:  1. DVT Prophylaxis/Anticoagulation: Pharmaceutical: Xarelto stopped.  -dopplers negative  -scd's, teds 2. Pain Management: prn ultram. celebrex reduced to qday.   - scheduled hydrocodone for better control-   -muscle relaxants  -activity to tolerance. 3. Mood: Pleasant and appropriate with good overall outlook.    4. Neuropsych: This patient is capable of making decisions on his/her own behalf.  5. ABLA: continue iron supplement.  See below 6. OSA: CPAP with oxygen when asleep. Oxygen as needed when up  -seems to be doing better with activity tolerance 7. Recent sinusitis: augmentin completed 8. Asthma: Monitor for dyspnea with activity. Continue symbicort and Flonase.  -encourage IS  -OOB as much as possible 9. HTN:   Continue  cozaar, and bystolic.  -resumed lasix  -prn hold parameters on meds for orthostasis  -encourage adequate fluids 10. Perioperative hematoma-  -s/p blood transfusion  -anticoagulation stopped  -blood count 9.1 today, and has remained stable.  -recheck Wednesday.  -feels relatively comfortable, hemodynamically stable  -full activity as tolerated  -keep staples in for now as there is a reasonable likelihood that hematoma will drain through wound. 11. Pre-renal azotemia: s/p fluid resuscitation  -essentiall resolved  -  low dose lasix  -encourage adequate fluid intake   LOS (Days) 13 A FACE TO FACE EVALUATION WAS PERFORMED  SWARTZ,ZACHARY T 11/15/2012 8:31 AM

## 2012-11-15 NOTE — Progress Notes (Signed)
Physical Therapy Note  Patient Details  Name: Tracey Nguyen MRN: [id number] Date of Birth: 11-17-1940 Today's Date: 11/15/2012  [phone] (55 minutes) individual Pain: 2/10-3/10 Rt hip/ premedicated Focus of treatment: Therapeutic exercise focused on bilateral LE strengthening/ AROM; bed mobility independence; gait training / endurance Treatment: wc mobility 75  feet SBA with 2 short rest breaks X 2 ; gait 50 feet RW TDWB RT LE SBA with one standing rest break ( Oxygen sats post ambulation 88% -90% RA); sit to supine (mat) SBA using sheet to self assist RT LE; Therapeutic exercise- 2 X 10 - bilateral heel slides (using sheet to assist Rt LE), hip ab/adduction (using sheet to assist RT LE) , SAQs, ankle pumps, standing Rt hip flexion with RW support.   1400- 1445 (45 minutes) individual Pain: no reported pain Focus of treatment : wc mobility training/endurance; gait training; therapeutic exercise focused on activity tolerance Treatment: wc mobility 50 feet SBA with one rest break; Nustep Level 4 X 10 minutes (Oxygen sats > 92% RA); gait 25 feet RW SBA TDWB RT LE.    Roshawna Colclasure,JIM 11/15/2012, 11:26 AM

## 2012-11-15 NOTE — Progress Notes (Signed)
Occupational Therapy Session Note  Patient Details  Name: Tracey Nguyen MRN: [id number] Date of Birth: June 29, 1941  Today's Date: 11/15/2012 Time: 1300-1330 Time Calculation (min): 30 min  Short Term Goals: Week 1:  OT Short Term Goal 1 (Week 1): Short Term Goals = Long Term Goals secondary to ELOS  Skilled Therapeutic Interventions/Progress Updates:    1:1 focus on functional ambulation in and out of bathroom, toileting sit to stand with supervision with min cuing for maintaining weight bearing precautions, standing balance at the sink without UE support, propelling w/c distance for endurance and activity tolerance training, negotiated through obstacles in the dayroom on carpet with encouragement and extra time.  Therapy Documentation Precautions:  Precautions Precautions: Fall Restrictions Weight Bearing Restrictions: Yes RLE Weight Bearing: Touchdown weight bearing Other Position/Activity Restrictions: R LE Pain:  no c/o pain in session  See FIM for current functional status  Therapy/Group: Individual Therapy  Roney Mans [facility] 11/15/2012, 3:18 PM

## 2012-11-15 NOTE — Progress Notes (Signed)
RT Note: Pt has CPAP set up in room per home settings. H2O filled and pts home mask on our machine with  2L O2 bleed in. Pt states she can place herself on when ready. Rt will continue to monitor

## 2012-11-15 NOTE — Progress Notes (Signed)
Occupational Therapy Session Note  Patient Details  Name: Tracey Nguyen MRN: [id number] Date of Birth: 1940-09-02  Today's Date: 11/15/2012 Time: 0700-0755 Time Calculation (min): 55 min  Short Term Goals: Week 2:   STG=LTG  Skilled Therapeutic Interventions/Progress Updates:    Pt seated in w/c upon arrival.  Pt rolled w/c to position at sink in preparation for bathing and dressing with sit<>stand from w/c.  Pt completed all bathing and dressing tasks at supervision level using AE appropriately.  Pt stated her left knee was painful this morning because she was using it a lot more when standing.  Focus on activity tolerance, dynamic standing balance, and safety awareness.  Pt O2 sats decreased to 85% on RA after extended period (3 mins) of standing and activity but recovered to 95% within 30 secs.  Therapy Documentation Precautions:  Precautions Precautions: Fall Restrictions Weight Bearing Restrictions: Yes RLE Weight Bearing: Touchdown weight bearing Other Position/Activity Restrictions: R LE General:   Vital Signs: Therapy Vitals Temp: 98.2 F (36.8 C) Temp src: Oral Pulse Rate: 66 Resp: 18 BP: 112/72 mmHg Patient Position, if appropriate: Lying Oxygen Therapy SpO2: 92 % O2 Device: None (Room air) Pain: Pain Assessment Pain Assessment: 0-10 Pain Score:   5 Pain Type: Acute pain Pain Location: Knee Pain Orientation: Left Pain Descriptors: Aching Pain Intervention(s): RN made aware;Repositioned  See FIM for current functional status  Therapy/Group: Individual Therapy  [name] 11/15/2012, 7:56 AM

## 2012-11-16 ENCOUNTER — Inpatient Hospital Stay (HOSPITAL_COMMUNITY): Payer: Medicare Other

## 2012-11-16 ENCOUNTER — Inpatient Hospital Stay (HOSPITAL_COMMUNITY): Payer: Medicare Other | Admitting: Physical Therapy

## 2012-11-16 DIAGNOSIS — S72009A Fracture of unspecified part of neck of unspecified femur, initial encounter for closed fracture: Secondary | ICD-10-CM

## 2012-11-16 DIAGNOSIS — IMO0002 Reserved for concepts with insufficient information to code with codable children: Secondary | ICD-10-CM

## 2012-11-16 DIAGNOSIS — G4733 Obstructive sleep apnea (adult) (pediatric): Secondary | ICD-10-CM | POA: Diagnosis present

## 2012-11-16 DIAGNOSIS — D62 Acute posthemorrhagic anemia: Secondary | ICD-10-CM

## 2012-11-16 DIAGNOSIS — N289 Disorder of kidney and ureter, unspecified: Secondary | ICD-10-CM | POA: Diagnosis not present

## 2012-11-16 MED ORDER — LOSARTAN POTASSIUM 100 MG PO TABS: 50.0000 mg | ORAL_TABLET | Freq: Every day | ORAL | Status: DC

## 2012-11-16 MED ORDER — HYDROCODONE-ACETAMINOPHEN 5-325 MG PO TABS: 1.0000 | ORAL_TABLET | ORAL | Status: DC | PRN

## 2012-11-16 MED ORDER — ACETAMINOPHEN 325 MG PO TABS: 325.0000 mg | ORAL_TABLET | ORAL | Status: DC | PRN

## 2012-11-16 MED ORDER — METHOCARBAMOL 500 MG PO TABS: 500.0000 mg | ORAL_TABLET | Freq: Four times a day (QID) | ORAL | Status: DC | PRN

## 2012-11-16 NOTE — Progress Notes (Signed)
Physical Therapy Discharge Summary  Patient Details  Name: Tracey Nguyen MRN: [id number] Date of Birth: 1940/12/02  Today's Date: 11/16/2012 Time: 1330-1400 Time Calculation (min): 30 min   Husband providing supervision for pt during bathroom mobility, cues for making sure he is always providing supervision for pt when she is mobilizing on her feet. Husband provided wife with supervision for low car transfer, high SUV transfer required min assist to help boost pt into seat. PT recommended lower car use. Ambulation with RW x 50' with supervision provided by husband. Demonstrated wheelchair and RW breakdown and how to place it into the car. Educated husband on curb bump up with wheelchair, husband demonstrated return of education. Pt able to mobilize with wheelchair modified independently however moves quite slowly secondary to body habitus/length of arms. Discussed barriers and safety in the home. Recommended 24 hour supervision initially, husband and pt verbalize understanding.   Patient has met 8 of 9 long term goals due to improved activity tolerance, improved balance, increased strength, decreased pain, ability to compensate for deficits and improved awareness. Pt has made excellent progress however continues to struggle with decreased cardiorespiratory response (occasionally has low SPO2), decreased endurance, decreased balance, decreased functional strength. Patient to discharge at a wheelchair level (to avoid placing too much weight on Rt. LE with fatigue) with Supervision for safety and to decrease risk for falls   Patient's care partner is independent to provide the necessary physical and cognitive assistance at discharge.  Reasons goals not met: Pt continues to need supervision for transfers secondary to unexpected slow progress with remembering precautions and wheelchair parts management.   Recommendation:  Patient will benefit from ongoing skilled PT services in home health setting  to continue to advance safe functional mobility, address ongoing impairments in Endurance, balance weight bearing precautions, ambulation, functional activity, and minimize fall risk.  Equipment: RW, wheelchair  Reasons for discharge: discharge from hospital  Patient/family agrees with progress made and goals achieved: Yes  PT Discharge Precautions/Restrictions Precautions Precautions: Fall Restrictions Weight Bearing Restrictions: Yes RLE Weight Bearing: Touchdown weight bearing Pain Pain Assessment Pain Score:   4 Pain Type: Surgical pain Pain Location: Hip Pain Orientation: Right Pain Descriptors: Aching Pain Onset: With Activity Pain Intervention(s):  (pt to call for medication once back to room)  Cognition Overall Cognitive Status: Appears within functional limits for tasks assessed Arousal/Alertness: Awake/alert Orientation Level: Oriented X4 Memory: Impaired Memory Impairment: Decreased recall of new information;Decreased short term memory Sensation Sensation Light Touch: Appears Intact Proprioception: Appears Intact Coordination Gross Motor Movements are Fluid and Coordinated: Yes Fine Motor Movements are Fluid and Coordinated: Yes  Mobility Bed Mobility Supine to Sit: 5: Supervision Sit to Supine: 5: Supervision Sit to Supine - Details (indicate cue type and reason): Pt requires use of sheet to help advance Rt. LE into/out of bed secondary to weakness.. This transition is very fatiguing for pt and she requires multiple rest breaks.  Transfers Sit to Stand: 5: Supervision Stand to Sit: 5: Supervision Locomotion  Ambulation Ambulation: Yes Ambulation/Gait Assistance: 5: Supervision Ambulation Distance (Feet): 50 Feet Assistive device: Rolling walker Ambulation/Gait Assistance Details: Cues for posture and weightbearing precautions Gait Gait: Yes Gait Pattern: Impaired Gait Pattern: Step-to pattern;Trunk flexed Stairs / Additional Locomotion Stairs:  No Wheelchair Mobility Wheelchair Mobility: Yes Wheelchair Assistance: 6: Modified independent (Device/Increase time) Occupational hygienist: Both upper extremities   Games developer Sitting - Balance Support: No upper extremity supported;Feet supported Static Sitting - Level of Assistance: 6: Modified  independent (Device/Increase time) Static Standing Balance Static Standing - Balance Support: Left upper extremity supported Static Standing - Level of Assistance: 5: Stand by assistance Extremity Assessment      RLE Assessment RLE Assessment: Exceptions to [facility] RLE Strength RLE Overall Strength Comments: not tested secondary to pain/ at least 3/5 LLE Assessment LLE Assessment: Within Functional Limits  See FIM for current functional status  Wilhemina Bonito 11/16/2012, 3:43 PM

## 2012-11-16 NOTE — Progress Notes (Signed)
Occupational Therapy Discharge Summary  Patient Details  Name: Tracey Nguyen MRN: [id number] Date of Birth: 12-Jul-1941  Today's Date: 11/16/2012 Time: 1300-1330  1:1 Session: Pt's husband for education and to practice tub bench transfers, toilet transfers, and toileting.  Discussed with patient and husband recommendation that someone be with patient 24/7.  Husband inquired if it was okay to go to grocery store, etc.  Instructed pt and husband to make a toilet trip prior to husband leaving for brief periods.  Focus on education and transfers.  Discharge Summary Patient has met 9 of 9 long term goals due to improved activity tolerance, improved balance, postural control and ability to compensate for deficits.  Pt has made steady progress with bathing, dressing, toilet transfers, tub bench transfers, and toileting.  Pt requires multiple rest breaks when completing tasks but has demonstrated appropriate awareness and takes rest breaks appropriately.  Pt employs appropriate energy conservation strategies during BADLs.  Pt's husband has been present for education and has demonstrated appropriate levels of supervision and assistance. Patient to discharge at overall Supervision level.  Patient's care partner is independent to provide the necessary physical assistance at discharge.     Recommendation:  No f/u  Equipment: No equipment provided.  Pt already own tub transfer bench and BSC.  Reasons for discharge: treatment goals met and discharge from hospital  Patient/family agrees with progress made and goals achieved: Yes  OT Discharge Pain  Pt denies pain ADL ADL Equipment Provided: Reacher;Sock aid;Long-handled sponge Eating: Independent Where Assessed-Eating: Wheelchair Grooming: Independent Where Assessed-Grooming: Sitting at sink Upper Body Bathing: Setup Where Assessed-Upper Body Bathing: Sitting at sink Lower Body Bathing: Supervision/safety Where Assessed-Lower Body  Bathing: Standing at sink;Sitting at sink Upper Body Dressing: Independent Where Assessed-Upper Body Dressing: Sitting at sink Lower Body Dressing: Supervision/safety Where Assessed-Lower Body Dressing: Standing at sink;Sitting at sink Toileting: Supervision/safety Where Assessed-Toileting: Teacher, adult education: Close supervision Toilet Transfer Method: Proofreader: Gaffer: International aid/development worker Method: Ship broker: Emergency planning/management officer Vision/Perception  Vision - History Baseline Vision: Wears glasses all the time Patient Visual Report: No change from baseline Vision - Assessment Eye Alignment: Within Functional Limits Vision Assessment: Vision not tested Perception Perception: Within Functional Limits Praxis Praxis: Intact  Cognition Overall Cognitive Status: Appears within functional limits for tasks assessed Arousal/Alertness: Awake/alert Orientation Level: Oriented X4 Sensation Sensation Light Touch: Appears Intact Stereognosis: Appears Intact Hot/Cold: Appears Intact Proprioception: Appears Intact Coordination Gross Motor Movements are Fluid and Coordinated: Yes Fine Motor Movements are Fluid and Coordinated: Yes Trunk/Postural Assessment  Cervical Assessment Cervical Assessment: Within Functional Limits Thoracic Assessment Thoracic Assessment: Within Functional Limits Lumbar Assessment Lumbar Assessment: Within Functional Limits Postural Control Postural Control: Within Functional Limits  Balance Static Sitting Balance Static Sitting - Level of Assistance: 7: Independent Extremity/Trunk Assessment RUE Assessment RUE Assessment: Within Functional Limits LUE Assessment LUE Assessment: Within Functional Limits  See FIM for current functional status  [name] 11/16/2012, 9:01 AM

## 2012-11-16 NOTE — Patient Care Conference (Signed)
Inpatient RehabilitationTeam Conference and Plan of Care Update Date: 11/14/2012   Time: 2:20 PM    Patient Name: Tracey Nguyen      Medical Record Number: [id number]  Date of Birth: [birth year] Sex: Female         Room/Bed: 4038/4038-01 Payor Info: Payor: MEDICARE  Plan: MEDICARE PART A AND B  Product Type: *No Product type*     Admitting Diagnosis: hip revision  Admit Date/Time:  11/02/2012  3:12 PM Admission Comments: No comment available   Primary Diagnosis:  Complication internal fixation device such as nail, plate, or rod Principal Problem: Complication internal fixation device such as nail, plate, or rod  Patient Active Problem List   Diagnosis Date Noted  . Acute on chronic renal insufficiency 11/16/2012  . Obstructive sleep apnea 11/16/2012  . Ethmoid sinusitis--recent flare 11/03/2012  . Acute blood loss anemia due to right thigh and right perioperative hematoma.  10/31/2012  . Obesity (BMI 30.0-34.9) 10/31/2012  . S/P removal of and implantation of new IM nail, right hip 10/30/2012  . CHEST DISCOMFORT 04/24/2009  . PERIPHERAL NEUROPATHY 09/17/2008  . HYPERTHYROIDISM 08/17/2007  . DEPRESSION 08/17/2007  . ALLERGIC RHINITIS 08/17/2007  . Moderate persistent asthma with allergic factors 08/17/2007  . GERD 08/17/2007  . OSTEOPOROSIS 08/17/2007  . HYPERTENSION NEC 08/17/2007  . BREAST CANCER, HX OF 08/17/2007  . COLONIC POLYPS, HX OF 08/17/2007  . TENDINITIS, TIBIALIS 05/15/2007    Expected Discharge Date: Expected Discharge Date: 11/16/12  Team Members Present: Physician leading conference: Dr. Faith Rogue Social Worker Present: Amada Jupiter, LCSW Nurse Present: Daryll Brod, RN PT Present: Karolee Stamps, PT Sherrine Maples, PT) OT Present: Mackie Pai, OT SLP Present: Feliberto Gottron, SLP Other (Discipline and Name): Tora Duck, PPS Coordinator;  Bayard Hugger, RN     Current Status/Progress Goal Weekly Team Focus  Medical   hematoma stabilized.  blood thinners held. pain better controlled  stabilize medically for dc  close hgb follow up, wound care, wean oxygen   Bowel/Bladder   Continent of bowel and bladder. LBM 11/13/12  Continent of bowel and bladder  Pt will remain continent of B&B   Swallow/Nutrition/ Hydration             ADL's   supervision bathing and UB dressing; min A LB dressing; steady A tranfsers; mod A tub transfer  Changed to supervision overall; min A tub transfer  activity tolerance; transfers   Mobility   Supervision/ min assist for bed mobility and assist needed for wheelchair parts management  supervision  Wheelchair setup, transfers, bed mobility, endurance, maintaining precautions, family education.   Communication             Safety/Cognition/ Behavioral Observations            Pain   Intermittent request for vicodin x 1 tab q 4hrs  <3  Offer pain medication 1 hr prior to initial therapy   Skin   R hip incision with staples and mepilex dressing, CDI  No additional skin breakdown  Monitor and assess skin q shift    Rehab Goals Patient on target to meet rehab goals: Yes *See Care Plan and progress notes for long and short-term goals.  Barriers to Discharge: see prior    Possible Resolutions to Barriers:  see prior, adjusted dc date to allow husband to be there initially    Discharge Planning/Teaching Needs:  Pt to d/c home with husband who can provide 24/7 initially.  Will provide with list of private  duty care agencies to use as needed.      Team Discussion:  Reaching goals.  Family education to be completed day of d/c. No concerns  Revisions to Treatment Plan:  None   Continued Need for Acute Rehabilitation Level of Care: The patient requires daily medical management by a physician with specialized training in physical medicine and rehabilitation for the following conditions: Daily direction of a multidisciplinary physical rehabilitation program to ensure safe treatment while eliciting the  highest outcome that is of practical value to the patient.: Yes Daily medical management of patient stability for increased activity during participation in an intensive rehabilitation regime.: Yes Daily analysis of laboratory values and/or radiology reports with any subsequent need for medication adjustment of medical intervention for : Post surgical problems;Pulmonary problems  Mayte Diers 11/16/2012, 1:52 PM

## 2012-11-16 NOTE — Progress Notes (Signed)
Social Work  Discharge Note  The overall goal for the admission was met for:   Discharge location: Yes - home with husband to assist  Length of Stay: Yes - 14 days  Discharge activity level: Yes - mod i w/c level  Home/community participation: Yes  Services provided included: MD, RD, PT, OT, RN, TR, Pharmacy and SW  Financial Services: Medicare and Private Insurance: BCBS  Follow-up services arranged: Home Health: PT, OT, RN via [facility], DME: w/c and cushion via [employer] and (youth) rolling walker via [facility] and Patient/Family has no preference for HH/DME agencies  Comments (or additional information):  Patient/Family verbalized understanding of follow-up arrangements: Yes  Individual responsible for coordination of the follow-up plan: patient  Confirmed correct DME delivered: [facility] 11/16/2012    Citlalic Norlander

## 2012-11-16 NOTE — Progress Notes (Signed)
Late entry: Pt discharged home with husband. Staples to R distal hip incision removed per MD's order. 2x2 gauze to R proximal hip incision completely saturated with old dark blood, soiling pt's clothing. Incision cleaned, new dressing applied. Educated pt's and husband on dressing change, as well as s/s of infection. Tramadol 50mg  given for pain prior to discharge. All discharge instructions provided by Delle Reining, PA. Pt and family verbalized understanding.  Pt escorted off unit in w/c with personal belonging by Rutha Bouchard, NT.

## 2012-11-16 NOTE — Discharge Summary (Signed)
Physician Discharge Summary  Patient ID: Tracey Nguyen MRN: [id number] DOB/AGE: [birth year] 73 y.o.  Admit date: 11/02/2012 Discharge date: 11/16/2012  Discharge Diagnoses:  Principal Problem:   S/P removal of and implantation of new IM nail, right hip Active Problems:   HYPERTENSION NEC   Acute blood loss anemia due to right thigh and right perioperative hematoma.    Obesity (BMI 30.0-34.9)   Ethmoid sinusitis--recent flare   Acute on chronic renal insufficiency   Obstructive sleep apnea   Discharged Condition: Good.  Significant Diagnostic Studies: Ct Abdomen Pelvis Wo Contrast  11/07/2012  *RADIOLOGY REPORT*  Clinical Data: Recurrent anemia and hypotension, post right-sided hip surgery (10/30/2012).  CT ABDOMEN AND PELVIS WITHOUT CONTRAST  Technique:  Multidetector CT imaging of the abdomen and pelvis was performed following the standard protocol without intravenous contrast.  Comparison: Right hip intraoperative radiographs - 10/30/2012  Findings:  The lack of intravenous contrast limits the ability to evaluate solid abdominal organs.  The patient has undergone intramedullary rod fixation of comminuted right-sided intertrochanteric femur fracture.  There is a serpiginous approximately 5.3 x 7.9 x 18.3 cm hematoma deep to the skin staples about the right lateral thigh (axial image 58, series 2, coronal image 67).  There is an additional smaller hematoma superior to the cranial aspect of the intramedullary rod fixation device which measures approximately 8.6 x 5.3 cm (axial image 66). These findings are associated with a minimal amount of asymmetric rather diffuse right-sided subcutaneous edema involving the imaged right leg.  Normal hepatic contour.  There is an approximately 6.8 x 4.8 cm slightly lobulated hypoattenuating lesion within the posterior aspect of the right lobe of the liver which is incompletely characterized though likely represents a hepatic cyst.  There is an additional  smaller (approximate 7 mm) hypoattenuating lesion within the anterior aspect of the right lobe of the liver (image 19, series 2) which is too small to accurately characterize.  The gallbladder is decompressed but otherwise unremarkable.  No ascites.  Note is made of 6 mm and 7 mm nonobstructing stones lying dependently within the left renal pelvis (axial images 33 and 34, coronal image 65).  There are additional punctate nonobstructing stones within the mid and inferior poles of the right kidney as well as a nonobstructing stones within the mid aspect of the left kidney (coronal images 69 and 73).  No evidence of urinary obstruction or perinephric stranding.  Normal noncontrast appearance of the bilateral adrenal glands and pancreas.  Incidental note is made of an indeterminate approximately 4.3 x 4.0 cm hypoattenuating lesion within the dome of the spleen, incompletely evaluated on this noncontrast examination.  No perisplenic stranding.  Moderate colonic stool burden without evidence of obstruction.  The bowel is otherwise normal in course and caliber without wall thickening.  Normal caliber abdominal aorta.  No definite retroperitoneal, mesenteric, pelvic or inguinal lymphadenopathy on this noncontrast examination.  Air is seen within the urinary bladder, presumably sequelae of recent Foley catheter insertion.  Normal noncontrast appearance of the pelvic organs.  No free fluid in the pelvis.  Limited visualization of the lower thorax demonstrates bibasilar ground-glass opacities, left greater than right, favored to represent atelectasis.  No focal airspace opacities.  No pleural effusion.  Normal heart size.  There is overall decreased attenuation of the intracardiac blood pool compatible with provided history of anemia.  IMPRESSION: 1.  Post intramedullary rod fixation of comminuted right-sided intratrochanteric femur fracture. 2.  Serpiginous approximately 5.3 x 7.9 x 18.3 cm hematoma  deep to the right lateral  thigh skin staples with an additional smaller approximately 8.6 x 5.3 cm hematoma adjacent to the cranial aspect of the intramedullary femur rod.  3.  Bilateral nonobstructing nephrolithiasis including adjacent 6 and a 7 mm stones within the right renal pelvis.  4.  Indeterminate approximately 6.8 cm hypoattenuating lesion within the dome of the right lobe of the liver, incompletely evaluated without intravenous contrast though likely represents a hepatic cyst. 5.  Indeterminate approximately 4.3 cm hypoattenuating lesion within the dome of the spleen, incompletely evaluated without intravenous contrast though may represent a splenic hemangioma.   Original Report Authenticated By: Tacey Ruiz, MD      Labs:  Basic Metabolic Panel:  Recent Labs Lab 11/13/12 [phone] 11/15/12 0607  NA 140 139  K 4.2 4.6  CL 101 101  CO2 34* 30  GLUCOSE 101* 104*  BUN 17 18  CREATININE 1.22* 1.33*  CALCIUM 9.4 9.6    CBC:  Recent Labs Lab 11/10/12 0620 11/13/12 0632 11/15/12 0607  WBC  --  11.1* 10.6*  HGB 9.1* 9.0* 9.1*  HCT 28.9* 28.2* 28.7*  MCV  --  72.5* 72.3*  PLT  --  226 262     HPI:   Tracey Nguyen is a 72 y.o. 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 Charlann Boxer on 10/30/12. Post op with ABLA with hgb 7.4 and was transfused with 2 units PRBC. 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 Therapies initiated and patient limited by TDWB and pain. Therapy team recommended CIR and patient admitted 03/27 for further therapies   Hospital Course: Tracey Nguyen was admitted to rehab 11/02/2012 for inpatient therapies to consist of PT, ST and OT at least three hours five days a week. Past admission physiatrist, therapy team and rehab RN have worked  together to provide customized collaborative inpatient rehab. Patient's pain was reasonably controlled on prn Vicodin.  Celebrex was decreased to once a day. She completed her course of antibiotics for treatment of recent sinusitis. Hospital course complicated by hypotension due to acute renal insufficiency with BUN-35 and creatinine -1.81. due to concerns of bleeding CT abdomen pelvis was done revealing right thigh and perioperative site hematoma. Her  H/H dropped to 7.3 therefore,  xarelto was discontinuedand she was transfused with 2 units PRBC.  She was placed on IVF, cozaar was discontinued and lasix decreased to 20 mg daily.  At time of discharge, renal status has improved and BP stabilize. She is discharged on home dose of lasix.  Serial H/H have been stable. Thigh hematoma is liquefying and patient has developed old bloody drainage from proximal aspect of the wound. Staples from distal aspect of wound were discontinued and she is scheduled to follow up with Dr. Charlann Boxer on Monday for wound check and input on removal of residual staples. She is to continue to wear TEDs and HEP for DVT prophylaxis past discharge. Repeat CBC to be drawn by [facility] on 04/15 with results to primary MD.    During patient's stay in rehab weekly team conferences were held to monitor patient's progress, set goals and discuss barriers to discharge. Therapy has focused on activity tolerance, endurance as well as working on ability to maintain TTWB on RLE.  Patient has made good progress and is able to adhere to her weight bearing restrictions. She does require occasional cues to adhere to this during toileting tasks.  She is at supervision level for transfers. She is able to ambulate up to 50 feet with RW and stand by assist.        Disposition: 01-Home or Self Care   Special Instructions: [facility] to continue to  provide PT and OT. HHRN to monitor wound and draw CBC 04/15 with results to Dr. Tawanna Cooler and Dr. Riley Kill.         Future Appointments Provider Department Dept Phone   12/05/2012 11:30 AM Storm Frisk, MD [facility] [phone]   12/07/2012 10:00 AM Roderick Pee, MD [facility] [phone]   04/30/2013 2:00 PM Nilda Riggs, NP [facility] [phone]       Medication List    STOP taking these medications       amoxicillin-clavulanate 875-125 MG per tablet  Commonly known as:  AUGMENTIN     aspirin EC 325 MG tablet     fluticasone 50 MCG/ACT nasal spray  Commonly known as:  FLONASE      TAKE these medications       acetaminophen 325 MG tablet  Commonly known as:  TYLENOL  Take 1-2 tablets (325-650 mg total) by mouth every 4 (four) hours as needed.     albuterol-ipratropium 18-103 MCG/ACT inhaler  Commonly known as:  COMBIVENT  Inhale 2 puffs into the lungs every 6 (six) hours as needed for wheezing or shortness of breath.     budesonide-formoterol 160-4.5 MCG/ACT inhaler  Commonly known as:  SYMBICORT  Inhale 2 puffs into the lungs 2 (two) times daily.     CALTRATE 600 PO  Take 1 tablet by mouth daily.     DSS 100 MG Caps  Take 100 mg by mouth 2 (two) times daily.     DULoxetine 30 MG capsule  Commonly known as:  CYMBALTA  Take 1 capsule (30 mg total) by mouth daily.     ferrous sulfate 325 (65 FE) MG tablet  Take 1 tablet (325 mg total) by mouth 3 (three) times daily after meals.     furosemide 40 MG tablet  Commonly known as:  LASIX  Take 40 mg by mouth daily before breakfast.     HYDROcodone-acetaminophen 5-325 MG per tablet  Commonly known as:  NORCO/VICODIN  Take 1 tablet by mouth every 4 (four) hours as needed.     losartan 100 MG tablet  Commonly known as:  COZAAR  Take 0.5 tablets (50 mg total) by mouth daily before breakfast.     methocarbamol 500 MG tablet  Commonly known as:  ROBAXIN  Take 1 tablet (500 mg total) by mouth every 6 (six) hours as needed. For spasms     multivitamin tablet  Take  1 tablet by mouth every other day.     nebivolol 5 MG tablet  Commonly known as:  BYSTOLIC  Take 5 mg by mouth daily after breakfast.     omeprazole 20 MG capsule  Commonly known as:  PRILOSEC  Take 20 mg by mouth daily before breakfast.     polyethylene glycol packet  Commonly known as:  MIRALAX / GLYCOLAX  Take 17 g by mouth 2 (two) times daily.     pramipexole 1 MG tablet  Commonly known as:  MIRAPEX  Take 1 mg by mouth every evening.  traMADol 50 MG tablet  Commonly known as:  ULTRAM  Take 50 mg by mouth every 6 (six) hours as needed for pain.       Follow-up Information   Follow up with TODD,JEFFREY Freida Busman, MD On 12/07/2012. (@ 10:00 am)    Contact information:   [address] [place] [address] [phone]       Call Ranelle Oyster, MD. (As needed)    Contact information:   [address], [address] [place] [address] [phone]       Follow up with [name], MD On 11/20/2012. (Be there at 9:30 am. )    Contact information:   [address], [address] [place] [address] [phone]       Signed: Jacquelynn Cree 11/16/2012, 3:57 PM

## 2012-11-16 NOTE — Progress Notes (Signed)
Patient ID: Tracey Nguyen, female   DOB: 08/08/1941, 72 y.o.   MRN: [id number] Subjective/Complaints: Leg feels better. Still a little tight. Comfortable with going home today.    12 point review of systems has been performed and if not noted above is otherwise negative.   Objective: Vital Signs: Blood pressure 116/61, pulse 76, temperature 98.3 F (36.8 C), temperature source Oral, resp. rate 20, height 4\' 9"  (1.448 m), weight 73.528 kg (162 lb 1.6 oz), SpO2 95.00%. No results found.  Recent Labs  11/15/12 0607  WBC 10.6*  HGB 9.1*  HCT 28.7*  PLT 262    Recent Labs  11/15/12 0607  NA 139  K 4.6  CL 101  GLUCOSE 104*  BUN 18  CREATININE 1.33*  CALCIUM 9.6   CBG (last 3)  No results found for this basename: GLUCAP,  in the last 72 hours  Wt Readings from Last 3 Encounters:  11/15/12 73.528 kg (162 lb 1.6 oz)  10/30/12 72.576 kg (160 lb)  10/30/12 72.576 kg (160 lb)    Physical Exam:  Constitutional: She is oriented to person, place, and time. She appears well-developed and well-nourished.  Morbidly obese  HENT:  Head: Normocephalic and atraumatic.  Eyes: Pupils are equal, round, and reactive to light.  Neck: Normal range of motion.  Cardiovascular: Normal rate and regular rhythm.  Pulmonary/Chest: Effort normal. Chest clear. No rales or rhonchi Abdominal: Bowel sounds are normal. Non distended. Non tender Musculoskeletal: She exhibits edema ( Right hip/thigh edema.-firm around op site, minimal drainage  ).  Neurological: She is alert and oriented to person, place, and time. Strength 4/5 UE. Lower ext 2-/5 RHF, 3-/5RKE, 4 ankle.---pain inhibition. LLE is 4 to 4+/5. No sensory deficits, DTR's 1+ Skin: Skin is warm and dry other than right hip wound--proximal portion with dark/old blood draining through still. Distal incision dry. Ext:  1+ edema R pretibial  Assessment/Plan: 1. Functional deficits secondary to failed right femur ORIF s/p revision which  require 3+ hours per day of interdisciplinary therapy in a comprehensive inpatient rehab setting. Physiatrist is providing close team supervision and 24 hour management of active medical problems listed below. Physiatrist and rehab team continue to assess barriers to discharge/monitor patient progress toward functional and medical goals. FIM: FIM - Bathing Bathing Steps Patient Completed: Chest;Right Arm;Left Arm;Abdomen;Front perineal area;Buttocks;Left lower leg (including foot);Right lower leg (including foot);Left upper leg;Right upper leg Bathing: 5: Supervision: Safety issues/verbal cues  FIM - Upper Body Dressing/Undressing Upper body dressing/undressing steps patient completed: Thread/unthread right bra strap;Thread/unthread left bra strap;Hook/unhook bra;Thread/unthread right sleeve of pullover shirt/dresss;Put head through opening of pull over shirt/dress;Thread/unthread left sleeve of pullover shirt/dress;Pull shirt over trunk Upper body dressing/undressing: 7: Complete Independence: No helper FIM - Lower Body Dressing/Undressing Lower body dressing/undressing steps patient completed: Thread/unthread right underwear leg;Thread/unthread left underwear leg;Pull underwear up/down;Thread/unthread right pants leg;Thread/unthread left pants leg;Pull pants up/down;Fasten/unfasten pants;Don/Doff right sock;Don/Doff left sock Lower body dressing/undressing: 5: Supervision: Safety issues/verbal cues  FIM - Toileting Toileting steps completed by patient: Adjust clothing prior to toileting;Performs perineal hygiene;Adjust clothing after toileting Toileting Assistive Devices: Grab bar or rail for support Toileting: 5: Supervision: Safety issues/verbal cues  FIM - Diplomatic Services operational officer Devices: Elevated toilet seat;Bedside commode;Walker Toilet Transfers: 5-To toilet/BSC: Supervision (verbal cues/safety issues);5-From toilet/BSC: Supervision (verbal cues/safety issues)  FIM  - Banker Devices: Therapist, occupational: 4: Supine > Sit: Min A (steadying Pt. > 75%/lift 1 leg);4: Sit > Supine: Min A (steadying pt. >  75%/lift 1 leg)  FIM - Locomotion: Wheelchair Locomotion: Wheelchair: 2: Travels 50 - 149 ft with supervision, cueing or coaxing FIM - Locomotion: Ambulation Locomotion: Ambulation Assistive Devices: Designer, industrial/product Ambulation/Gait Assistance: 5: Supervision Locomotion: Ambulation: 1: Travels less than 50 ft with supervision/safety issues  Comprehension Comprehension Mode: Auditory Comprehension: 7-Follows complex conversation/direction: With no assist  Expression Expression Mode: Verbal Expression: 7-Expresses complex ideas: With no assist  Social Interaction Social Interaction Mode: Not assessed Social Interaction: 6-Interacts appropriately with others with medication or extra time (anti-anxiety, antidepressant).  Problem Solving Problem Solving Mode: Not assessed Problem Solving: 6-Solves complex problems: With extra time  Memory Memory Mode: Not assessed Memory Assistive Devices: Memory book Memory: 6-More than reasonable amt of time  Medical Problem List and Plan:  1. DVT Prophylaxis/Anticoagulation: Pharmaceutical: Xarelto stopped.  -dopplers negative  -scd's, teds 2. Pain Management: prn ultram. celebrex reduced to qday.   - scheduled hydrocodone for better control-   -muscle relaxants  -activity to tolerance. 3. Mood: Pleasant and appropriate with good overall outlook.    4. Neuropsych: This patient is capable of making decisions on his/her own behalf.  5. ABLA: continue iron supplement.  See below 6. OSA: CPAP with oxygen when asleep. Oxygen as needed when up  -seems to be doing better with activity tolerance 7. Recent sinusitis: augmentin completed 8. Asthma: Monitor for dyspnea with activity. Continue symbicort and Flonase.  -encourage IS  -OOB as much as possible 9. HTN:    Continue cozaar, and bystolic.  -resumed lasix  -prn hold parameters on meds for orthostasis  -encourage adequate fluids 10. Perioperative hematoma-  -s/p blood transfusion  -anticoagulation stopped  -blood count 9.1 today, and has remained stable.  -recheck next Tuesday  -feels relatively comfortable, hemodynamically stable  -full activity as tolerated  -keep PROXIMAL staples in for now as there is a reasonable likelihood that hematoma will drain through wound.  -she will see ortho on Monday for follow up 11. Pre-renal azotemia: s/p fluid resuscitation  -essentiall resolved  -  low dose lasix resumed per home regimen  -encourage adequate fluid intake   LOS (Days) 14 A FACE TO FACE EVALUATION WAS PERFORMED  Elwanda Moger T 11/16/2012 8:06 AM

## 2012-12-05 ENCOUNTER — Ambulatory Visit (INDEPENDENT_AMBULATORY_CARE_PROVIDER_SITE_OTHER): Payer: Medicare Other | Admitting: Critical Care Medicine

## 2012-12-05 ENCOUNTER — Encounter: Payer: Self-pay | Admitting: Critical Care Medicine

## 2012-12-05 VITALS — BP 124/80 | HR 92 | Temp 98.3°F | Ht <= 58 in

## 2012-12-05 DIAGNOSIS — J45909 Unspecified asthma, uncomplicated: Secondary | ICD-10-CM

## 2012-12-05 MED ORDER — IPRATROPIUM-ALBUTEROL 20-100 MCG/ACT IN AERS: 1.0000 | INHALATION_SPRAY | Freq: Four times a day (QID) | RESPIRATORY_TRACT | Status: DC | PRN

## 2012-12-05 NOTE — Assessment & Plan Note (Signed)
Asthmatic bronchitis with previous sinusitis now improved Plan Maintain inhaled medications as prescribed

## 2012-12-05 NOTE — Progress Notes (Signed)
Subjective:    Patient ID: Tracey Nguyen, female    DOB: 1941-05-26, 72 y.o.   MRN: [id number]  HPI 72 y.o. Marland KitchenF with moderate persistent asthma, allergic rhinitis, OSA, restriction d/t scoliosis  12/05/2012 At last ov 2/14: rx pred pulse and neb med rx Also called pred /augmentin 10/24/12 over phone Since then dyspnea is better.  Hip became fx/dislocated and had surgery with Charlann Boxer end of 10/2012.  Prior femur fx 01/2010.  Had to replace old hardware and repin the hip, went to rehab, has been home two weeks Now : notes mild cough, notes ongoing hoarse.  Notes some pndrip. No real heartburn or indigestion.   Review of Systems  11 point review of systems taken in detail and is negative except as noted above    Objective:   Physical Exam Filed Vitals:   12/05/12 1151  BP: 124/80  Pulse: 92  Temp: 98.3 F (36.8 C)  TempSrc: Oral  Height: 4\' 9"  (1.448 m)  SpO2: 92%    JXB:JYNWG, in no distress,  normal affect  ENT: No lesions,  mouth clear,  oropharynx clear, no postnasal drip  Neck: No JVD, no TMG, no carotid bruits  Lungs: No use of accessory muscles, no dullness to percussion, clear without rales or rhonchi  Cardiovascular: RRR, heart sounds normal, no murmur or gallops, no peripheral edema  Abdomen: soft and NT, no HSM,  BS normal  Musculoskeletal: No deformities, no cyanosis or clubbing  Neuro: alert, non focal  Skin: Warm, no lesions or rashes  No results found.        Assessment & Plan:   Moderate persistent asthma with allergic factors Asthmatic bronchitis with previous sinusitis now improved Plan Maintain inhaled medications as prescribed   Updated Medication List Outpatient Encounter Prescriptions as of 12/05/2012  Medication Sig Dispense Refill  . acetaminophen (TYLENOL) 325 MG tablet Take 1-2 tablets (325-650 mg total) by mouth every 4 (four) hours as needed.      Marland Kitchen albuterol-ipratropium (COMBIVENT) 18-103 MCG/ACT inhaler Inhale 2 puffs into  the lungs every 6 (six) hours as needed for wheezing or shortness of breath.      . budesonide-formoterol (SYMBICORT) 160-4.5 MCG/ACT inhaler Inhale 2 puffs into the lungs 2 (two) times daily.      . Calcium Carbonate (CALTRATE 600 PO) Take 1 tablet by mouth daily.       Tery Sanfilippo Sodium (DSS) 100 MG CAPS Take 100 mg by mouth daily.      . DULoxetine (CYMBALTA) 30 MG capsule Take 1 capsule (30 mg total) by mouth daily.  30 capsule  5  . fluticasone (FLONASE) 50 MCG/ACT nasal spray Place 1 spray into the nose at bedtime.      . furosemide (LASIX) 40 MG tablet Take 40 mg by mouth daily before breakfast.      . HYDROcodone-acetaminophen (NORCO/VICODIN) 5-325 MG per tablet Take 1 tablet by mouth every 4 (four) hours as needed.  30 tablet  0  . losartan (COZAAR) 100 MG tablet Take 0.5 tablets (50 mg total) by mouth daily before breakfast.      . methocarbamol (ROBAXIN) 500 MG tablet Take 1 tablet (500 mg total) by mouth every 6 (six) hours as needed. For spasms  30 tablet  1  . Multiple Vitamin (MULTIVITAMIN) tablet Take 1 tablet by mouth every other day.       . nebivolol (BYSTOLIC) 5 MG tablet Take 5 mg by mouth daily after breakfast.      .  omeprazole (PRILOSEC) 20 MG capsule Take 20 mg by mouth daily before breakfast.      . polyethylene glycol (MIRALAX / GLYCOLAX) packet Take 17 g by mouth daily.      . pramipexole (MIRAPEX) 1 MG tablet Take 1 mg by mouth every evening.       . traMADol (ULTRAM) 50 MG tablet Take 50 mg by mouth every 6 (six) hours as needed for pain.      . [DISCONTINUED] docusate sodium 100 MG CAPS Take 100 mg by mouth 2 (two) times daily.  10 capsule    . [DISCONTINUED] polyethylene glycol (MIRALAX / GLYCOLAX) packet Take 17 g by mouth 2 (two) times daily.  14 each    . Ipratropium-Albuterol (COMBIVENT RESPIMAT) 20-100 MCG/ACT AERS respimat Inhale 1 puff into the lungs 4 (four) times daily as needed.  1 Inhaler  6  . [DISCONTINUED] ferrous sulfate 325 (65 FE) MG tablet Take 1  tablet (325 mg total) by mouth 3 (three) times daily after meals.       No facility-administered encounter medications on file as of 12/05/2012.

## 2012-12-05 NOTE — Patient Instructions (Addendum)
No change in medications. Return in    4 months           Work on inhaler technique

## 2012-12-07 ENCOUNTER — Telehealth: Payer: Self-pay | Admitting: *Deleted

## 2012-12-07 ENCOUNTER — Ambulatory Visit (INDEPENDENT_AMBULATORY_CARE_PROVIDER_SITE_OTHER): Payer: Medicare Other | Admitting: Family Medicine

## 2012-12-07 ENCOUNTER — Encounter: Payer: Self-pay | Admitting: Family Medicine

## 2012-12-07 VITALS — BP 124/84 | HR 94 | Temp 98.5°F

## 2012-12-07 DIAGNOSIS — IMO0002 Reserved for concepts with insufficient information to code with codable children: Secondary | ICD-10-CM

## 2012-12-07 DIAGNOSIS — T889XXS Complication of surgical and medical care, unspecified, sequela: Secondary | ICD-10-CM

## 2012-12-07 DIAGNOSIS — G609 Hereditary and idiopathic neuropathy, unspecified: Secondary | ICD-10-CM

## 2012-12-07 DIAGNOSIS — Z853 Personal history of malignant neoplasm of breast: Secondary | ICD-10-CM

## 2012-12-07 DIAGNOSIS — K219 Gastro-esophageal reflux disease without esophagitis: Secondary | ICD-10-CM

## 2012-12-07 DIAGNOSIS — T849XXS Unspecified complication of internal orthopedic prosthetic device, implant and graft, sequela: Secondary | ICD-10-CM

## 2012-12-07 DIAGNOSIS — F329 Major depressive disorder, single episode, unspecified: Secondary | ICD-10-CM

## 2012-12-07 DIAGNOSIS — E669 Obesity, unspecified: Secondary | ICD-10-CM

## 2012-12-07 DIAGNOSIS — D649 Anemia, unspecified: Secondary | ICD-10-CM

## 2012-12-07 DIAGNOSIS — G4733 Obstructive sleep apnea (adult) (pediatric): Secondary | ICD-10-CM

## 2012-12-07 LAB — POCT HEMOGLOBIN: Hemoglobin: 12 g/dL — AB (ref 12.2–16.2)

## 2012-12-07 MED ORDER — LOSARTAN POTASSIUM 50 MG PO TABS: 50.0000 mg | ORAL_TABLET | Freq: Every day | ORAL | Status: DC

## 2012-12-07 MED ORDER — FUROSEMIDE 40 MG PO TABS: 40.0000 mg | ORAL_TABLET | Freq: Every day | ORAL | Status: DC

## 2012-12-07 MED ORDER — PRAMIPEXOLE DIHYDROCHLORIDE 1 MG PO TABS
1.0000 mg | ORAL_TABLET | Freq: Every evening | ORAL | Status: DC
Start: 2012-12-07 — End: 2012-12-07

## 2012-12-07 MED ORDER — PRAMIPEXOLE DIHYDROCHLORIDE 1 MG PO TABS: 1.0000 mg | ORAL_TABLET | Freq: Every evening | ORAL | Status: DC

## 2012-12-07 NOTE — Progress Notes (Signed)
  Subjective:    Patient ID: Tracey Nguyen, female    DOB: 1940/08/24, 72 y.o.   MRN: [id number]  HPI Tracey Nguyen is a 72 year old married female nonsmoker who comes in today for followup having recently been hospitalized on March 24 through March 27 for a re\re nailing right hip  She takes Lasix 40 mg daily and Cozaar 100 mg........ One half tab daily for hypertension BP 124/84  She takes Prilosec 20 mg for reflux esophagitis  She takes Mirapex 1 mg each bedtime for restless leg syndrome and peripheral neuropathy  She also takes Tylenol and tramadol when necessary for back and hip pain. She's also on Cymbalta 30 mg daily for depression  She did well postop and is undergoing physical therapy. Postop she was slightly anemic hemoglobin today was 12. 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  Review of all her medical history and medications done. She also sees Dr. Dennie Bible ,,,,,, and is on 2 L of oxygen with her CPAP       Assessment & Plan:  Depression continue Cymbalta  Hypertension continue Lasix and Cozaar  Reflux esophagitis continue Prilosec 20 mg daily  Peripheral neuropathy no masses leg syndrome Mirapex 1 mg each bedtime  Recent re\re do of a hip procedure tramadol and or Tylenol when necessary for pain

## 2012-12-07 NOTE — Patient Instructions (Signed)
Stop the Cymbalta  Lasix 40 mg, Cozaar 50 mg,,,,,,,,, one of each in the morning for high blood pressure  Prilosec 20 mg daily,,,,,,,,,, this is now over-the-counter  Mirapex 1 mg each bedtime  Tylenol 2 tabs 3 times a day when necessary for minor pain  Tramadol 50 mg..........Marland Kitchen 1-2 tabs 3 times daily for severe pain  Stop the vitamins and calcium supplements  Steroid nasal spray is now also over-the-counter

## 2012-12-07 NOTE — Telephone Encounter (Signed)
The patient husband calling stating that [facility] has sent a pre-auth over on Monday and they still have not received it. Please call the patient and advise. The prescription is Pramipexole.

## 2012-12-07 NOTE — Telephone Encounter (Signed)
I called the pharmacy.  They said the medication does not need a prior auth, they just need to get a refill auth, which I will send now.  They said they will contact patient when rx is ready for pick up.

## 2013-01-03 ENCOUNTER — Other Ambulatory Visit: Payer: Self-pay | Admitting: Critical Care Medicine

## 2013-01-03 NOTE — Telephone Encounter (Signed)
Losartan 100 mg rx sent by Dr. Nelida Meuse office on 12/07/12 #90 x 3.

## 2013-01-10 ENCOUNTER — Encounter: Payer: Self-pay | Admitting: Neurology

## 2013-01-12 ENCOUNTER — Encounter: Payer: Self-pay | Admitting: Neurology

## 2013-01-12 ENCOUNTER — Ambulatory Visit (INDEPENDENT_AMBULATORY_CARE_PROVIDER_SITE_OTHER): Payer: Medicare Other | Admitting: Neurology

## 2013-01-12 VITALS — BP 123/78 | HR 99 | Temp 99.1°F | Ht <= 58 in | Wt 156.0 lb

## 2013-01-12 DIAGNOSIS — J989 Respiratory disorder, unspecified: Secondary | ICD-10-CM

## 2013-01-12 DIAGNOSIS — G4736 Sleep related hypoventilation in conditions classified elsewhere: Secondary | ICD-10-CM

## 2013-01-12 DIAGNOSIS — G609 Hereditary and idiopathic neuropathy, unspecified: Secondary | ICD-10-CM

## 2013-01-12 DIAGNOSIS — G2581 Restless legs syndrome: Secondary | ICD-10-CM

## 2013-01-12 MED ORDER — PRAMIPEXOLE DIHYDROCHLORIDE 1 MG PO TABS: 1.0000 mg | ORAL_TABLET | Freq: Every evening | ORAL | Status: DC

## 2013-01-12 NOTE — Progress Notes (Signed)
[facility]  Provider:  Dr Anihya Tuma Referring Provider: Roderick Pee, MD Primary Care Physician:  Evette Georges, MD  Chief Complaint  Patient presents with  . Follow-up    AHI high while on CPAP, RM 11    HPI:  Tracey Nguyen is a 72 y.o. female here as a  Follow up from  Dr. Tawanna Cooler for  Sleepiness , with apnea and oxygen need. The patient has severe scoliosis and a non comlpiant chest wall. The patient has been followed in this office since 2006. The patient and meanwhile a 72 year old patient, married right-handed female returns for followup she has a history of hypotony was obstructive apnea and uses CPAP with nasal pillows at night. She also has a history of restless leg syndrome she is currently on Cymbalta and Mirapex tolerating both without side effects. She is using CPAP for many years her last for a sleep study was in 2007 and her last sleep study in 09/09/2011 at the time the patient had and was the Epworth Sleepiness Scale at 16 points the back inventory at 21 points her BMI was 37.7 her neck circumference measured at 18 inches. She had to increasing fatigue and sleepiness concerns she also needed to take fluid pills for swelling of the ankle and she needed Combivent for her restrictive breathing disorder. She had increasing dyspnea at the time. AHI was 87.7 her RDI of 92.7 her on the 18 [place] obstructive apneas that last majority of events were hypotony understood to shallow breathing and likely attributed to her noncompliant chest wall. The patient was titrated to 10 cm water and the residual AHI was 5.3 after CPAP was initiated her borderline tachycardia was reduced to the 80s in normal sinus rhythm. The patient had the CPAP data downloads and 2013 as well as an overnight pulse oximetry the pulse oximetry from 10-1211 short persistent low oxygen levels at or below 88% for 4 hours and 47 minutes at night. CPAP Donald's short residual apnea hypotony or in the  disease between 6.9 and 7.7.  To date in the office data download shows a set pressure of 13 cm water with a EPR of 2 centimeter. The patient's  Residual AHI of 6.9 is average . She uses  Nightly her CPAP  machine for  4 hours and 37 minutes- she uses her machine every day but some nights less than 4 hours depending on well-being and sleeptime.  He uses a nasal mask. The patient has to be seen today for a face-to-face qualification for oxygen respiratory rate at baseline is 17- Mallampati 4- and CO2 retention from scoliosis overlap the severe apnea hypotony or was confirmed advanced on care was to set her up this oxygen 2 L at night. In the meantime she has been Dr. Delford Field, her pulmonologist prescribed continued Combivent but also an air treatment  Symbicort.  Today's Epworth sleepiness score is 18 points, the patient is not short of breath at rest but with minimal exertion. Has irregular sleep habits and bedtimes. The patient has nights which he goes to bed at 1 AM and again rises at 3 AM, we are working on a sleep hygiene routine that would allow her to go to bed between 11 and midnight and to rise between 5 and 6 AM.  Due to her chest wall and anatomy and her hypoventilation resulting, plus asthma ,  the patient is not a candidate for a opiate pain medication or a sedative sleep aid.   [facility] is  DME - Buford Dresser , RT        Review of Systems: Out of a complete 14 system review, the patient complains of only the following symptoms, and all other reviewed systems are negative. Epworth  18 ,  On CPAP and 2 litres of 02 at night. FSS40 points, GDS 6 points. Fall risk - uses walker 12 points.    History   Social History  . Marital Status: Married    Spouse Name: N/A    Number of Children: N/A  . Years of Education: N/A   Occupational History  . Not on file.   Social History Main Topics  . Smoking status: Former Smoker    Quit date: 08/09/1964  . Smokeless tobacco: Never Used  .  Alcohol Use: No  . Drug Use: No  . Sexually Active: No   Other Topics Concern  . Not on file   Social History Narrative   patient needs to be seen face to face for oxygen qualification, RR is 16 , min. mallompati 4 and co2 retention from scoliosis overlap with severe OSA - AHI was 87 at baseline. titrated to 13 cm water CPAP, [facility] to follow.           No family history on file.  Past Medical History  Diagnosis Date  . Asthma   . Arthritis   . Cancer 2007    breast cancer  . GERD (gastroesophageal reflux disease)   . Hypertension   . Osteoporosis   . Sleep apnea     on cpap  . Blood transfusion   . Cataract     BILATERAL -REMOVED VIA SURGERY  . History of D&C   . Pneumonia     hx of  . Headache(784.0)     hx of migraines  . Complication of anesthesia     hard to wake up in 1970  . Alpha thalassemia minor trait     Past Surgical History  Procedure Laterality Date  . Femur fracture surgery  2011    titanium rod/right  . Mastectomy  2007    right  . Hemorrhoidectomy  1987  . Breast biopsy  1995    left  . Cataract extraction, bilateral  2006  . Dilation and curettage of uterus    . Femur im nail Right 10/30/2012    Procedure: revision right femur INTRAMEDULLARY NAILING;  Surgeon: [name], MD;  Location: [facility];  Service: Orthopedics;  Laterality: Right;  . Hip surgery  10/30/12    Current Outpatient Prescriptions  Medication Sig Dispense Refill  . acetaminophen (TYLENOL) 325 MG tablet Take 1-2 tablets (325-650 mg total) by mouth every 4 (four) hours as needed.      Marland Kitchen albuterol-ipratropium (COMBIVENT) 18-103 MCG/ACT inhaler Inhale 2 puffs into the lungs every 6 (six) hours as needed for wheezing or shortness of breath.      . budesonide-formoterol (SYMBICORT) 160-4.5 MCG/ACT inhaler Inhale 2 puffs into the lungs 2 (two) times daily.      . furosemide (LASIX) 40 MG tablet Take 1 tablet (40 mg total) by mouth daily before breakfast.  100 tablet  3  .  Ipratropium-Albuterol (COMBIVENT RESPIMAT) 20-100 MCG/ACT AERS respimat Inhale 1 puff into the lungs 4 (four) times daily as needed.  1 Inhaler  6  . losartan (COZAAR) 50 MG tablet Take 1 tablet (50 mg total) by mouth daily.  90 tablet  3  . methocarbamol (ROBAXIN) 500 MG tablet Take 1 tablet (500 mg  total) by mouth every 6 (six) hours as needed. For spasms  30 tablet  1  . nebivolol (BYSTOLIC) 5 MG tablet Take 5 mg by mouth daily after breakfast.      . omeprazole (PRILOSEC) 20 MG capsule Take 20 mg by mouth daily before breakfast.      . polyethylene glycol (MIRALAX / GLYCOLAX) packet Take 17 g by mouth daily.      . pramipexole (MIRAPEX) 1 MG tablet Take 1 tablet (1 mg total) by mouth every evening.  90 tablet  3  . traMADol (ULTRAM) 50 MG tablet Take 50 mg by mouth every 6 (six) hours as needed for pain.      . DULoxetine (CYMBALTA) 30 MG capsule       . fluticasone (FLONASE) 50 MCG/ACT nasal spray        No current facility-administered medications for this visit.    Allergies as of 01/12/2013 - Review Complete 01/12/2013  Allergen Reaction Noted  . Codeine  05/25/2007    Vitals: BP 123/78  Pulse 99  Temp(Src) 99.1 F (37.3 C) (Oral)  Ht 3' 10.5" (1.181 m)  Wt 156 lb (70.761 kg)  BMI 50.73 kg/m2 Last Weight:  Wt Readings from Last 1 Encounters:  01/12/13 156 lb (70.761 kg)   Last Height:   Ht Readings from Last 1 Encounters:  01/12/13 3' 10.5" (1.181 m)   Vision Screening:  Physical exam:  General: The patient is awake, alert and appears not in acute distress. The patient is well groomed. Head: Normocephalic, atraumatic. Neck is supple. Mallampati 4, neck circumference: 18.5  Cardiovascular:  Regular rate and rhythm, without  murmurs or carotid bruit, and without distended neck veins. Respiratory: Lungs are clear to auscultation. Skin:  Without evidence of edema, or rash Trunk: BMI is elevated and patient has diet instruction. She has severe scoliosis .  Neurologic  exam : The patient is awake and alert, oriented to place and time.  Memory subjective described as intact. There is a normal attention span & concentration ability. Speech is fluent withoutdysarthria, but she has  dysphonia  Mood and affect are appropriate.  Cranial nerves: Pupils are equal and briskly reactive to light. Funduscopic exam without  evidence of pallor or edema. Extraocular movements  in vertical and horizontal planes intact and without nystagmus. Visual fields by finger perimetry are intact. Hearing to finger rub intact.  Facial sensation intact to fine touch. Facial motor strength is symmetric and tongue and uvula move midline.  Motor exam:   Normal tone and normal muscle bulk and symmetric normal strength in all extremities.  Sensory:  Fine touch, pinprick and vibration were tested in all extremities. Proprioception is tested in the upper extremities only. This was  normal.  Coordination: Rapid alternating movements in the fingers/hands is tested and normal. Finger-to-nose maneuver tested and normal without evidence of ataxia, dysmetria or tremor.  Gait and station: Patient walks with walker, gets easily SOB,  Deep tendon reflexes: in the  upper and lower extremities are symmetric and intact. Babinski maneuver response is downgoing.   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 The hypotony as measured increase the AHI well into the 80s during the patient's last sleep study. Her download sure residual AHI of 6.9 which is a good success. Per pulse oximetry on CPAP along shoulder need for oxygen with a prolonged desaturation time off totally  4 hours 47 minutes and 40 seconds .This was performed on 10/19/2011. The patient in addition has asthma and central obstruction with the upper airway as well she has a short  neck and a restricted upper airway was a Mallampati 4. Continuous use of CPAP with oxygen is recommended. The treatment for asthma but Dr. Delford Field initiated has helped the patient's shortness of breath tremendously.  Plan:  Treatment plan and additional workup will be reviewed under Problem List.  yearly RV with CPAP and download.  The patient CPAP machine is 73 years old and was reset in 2012, she may qualify for a new machine. O   the patient's restless legs are under good control was 1 mg of Mirapex at night I refilled 90 day supplies. The patient's restless leg syndrome is well controlled.

## 2013-01-12 NOTE — Patient Instructions (Signed)
CPAP and BIPAP CPAP and BIPAP are methods of helping you breathe. CPAP stands for "continuous positive airway pressure." BIPAP stands for "bi-level positive airway pressure." Both CPAP and BIPAP are provided by a small machine with a flexible plastic tube that attaches to a plastic mask that goes over your nose or mouth. Air is blown into your air passages through your nose or mouth. This helps to keep your airways open and helps to keep you breathing well. The amount of pressure that is used to blow the air into your air passages can be set on the machine. The pressure setting is based on your needs. With CPAP, the amount of pressure stays the same while you breathe in and out. With BIPAP, the amount of pressure changes when you inhale and exhale. Your caregiver will recommend whether CPAP or BIPAP would be more helpful for you.  CPAP and BIPAP can be helpful for both adults and children with:  Sleep apnea.  Chronic Obstructive Pulmonary Disease (COPD), a condition like emphysema.  Diseases which weaken the muscles of the chest such as muscular dystrophy or neurological diseases.  Other problems that cause breathing to be weak or difficult. USE OF CPAP OR BIPAP The respiratory therapist or technician will help you get used to wearing the mask. Some people feel claustrophobic (a trapped or closed in feeling) at first, because the mask needs to be fairly snug on your face.   It may help you to get used to the mask gradually, by first holding the mask loosely over your nose or mouth using a low pressure setting on the machine. Gradually the mask can be applied more snugly with increased pressure. You can also gradually increase the amount of time the mask is used.  People with sleep apnea will use the mask and machine at night when they are sleeping. Others, like those with ALS or other breathing difficulties, may need the CPAP or BIPAP all the time.  If the first mask you try does not fit well, or  is uncomfortable, there are other types and sizes that can be tried.  If you tend to breathe through your mouth, a chin strap may be applied to help keep your mouth closed (if you are using a nasal mask).  The CPAP and BIPAP machines have alarms that may sound if the mask comes off or develops a leak.  You should not eat or drink while the CPAP or BIPAP is on. Food or fluids could get pushed into your lungs by the pressure of the CPAP or BIPAP. Sometimes CPAP or BIPAP machines are ordered for home use. If you are going to use the CPAP or BIPAP machine at home, follow these instructions  CPAP or BIPAP machines can be rented or purchased through home health care companies. There are many different brands of machines available. If you rent a machine before purchasing you may find which particular machine works well for you.  Ask questions if there is something you do not understand when picking out your machine.  Place your CPAP or BIPAP machine on a secure table or stand near an electrical outlet.  Know where the On/Off switch is.  Follow your doctor's instructions for how to set the pressure on your machine and when you should use it.  Do not smoke! Tobacco smoke residue can damage the machine. SEEK IMMEDIATE MEDICAL CARE IF:   You have redness or open areas around your nose or mouth.  You have trouble operating  the CPAP or BIPAP machine.  You cannot tolerate wearing the CPAP or BIPAP mask.  You have any questions or concerns. Document Released: 04/23/2004 Document Revised: 10/18/2011 Document Reviewed: 07/23/2008 [facility] Patient Information 2014 [place], [place].

## 2013-02-05 ENCOUNTER — Other Ambulatory Visit: Payer: Self-pay | Admitting: Family Medicine

## 2013-03-20 ENCOUNTER — Telehealth: Payer: Self-pay | Admitting: Critical Care Medicine

## 2013-03-20 NOTE — Telephone Encounter (Signed)
Spoke with patient-- Patient states for the most part she has been feeling better , but this AM began coughing and feels asthma is "acting up" Patient requesting to be seen this week Patient aware Dr. Delford Field is out office- Patient has been scheduled to see Tammy P Thursday Aug 14 @ 230pm Nothing further needed at this time

## 2013-03-21 ENCOUNTER — Telehealth: Payer: Self-pay | Admitting: Critical Care Medicine

## 2013-03-21 NOTE — Telephone Encounter (Signed)
Error.Tracey Nguyen ° °

## 2013-03-22 ENCOUNTER — Ambulatory Visit: Payer: Medicare Other | Admitting: Adult Health

## 2013-04-02 ENCOUNTER — Other Ambulatory Visit: Payer: Self-pay | Admitting: Family

## 2013-04-02 ENCOUNTER — Other Ambulatory Visit: Payer: Self-pay | Admitting: Critical Care Medicine

## 2013-04-04 ENCOUNTER — Encounter: Payer: Self-pay | Admitting: Critical Care Medicine

## 2013-04-04 ENCOUNTER — Ambulatory Visit (INDEPENDENT_AMBULATORY_CARE_PROVIDER_SITE_OTHER): Payer: Medicare Other | Admitting: Critical Care Medicine

## 2013-04-04 VITALS — BP 120/84 | HR 70 | Temp 98.3°F | Ht <= 58 in | Wt 159.3 lb

## 2013-04-04 DIAGNOSIS — J45909 Unspecified asthma, uncomplicated: Secondary | ICD-10-CM

## 2013-04-04 MED ORDER — BUDESONIDE-FORMOTEROL FUMARATE 160-4.5 MCG/ACT IN AERO: 2.0000 | INHALATION_SPRAY | Freq: Two times a day (BID) | RESPIRATORY_TRACT | Status: DC

## 2013-04-04 MED ORDER — IPRATROPIUM-ALBUTEROL 18-103 MCG/ACT IN AERO: 1.0000 | INHALATION_SPRAY | Freq: Four times a day (QID) | RESPIRATORY_TRACT | Status: DC | PRN

## 2013-04-04 NOTE — Assessment & Plan Note (Signed)
Moderate persistent asthma stable at this time with significant atopic features Plan Maintain inhaled medications as prescribed Administer Pneumovax

## 2013-04-04 NOTE — Progress Notes (Signed)
Subjective:    Patient ID: Tracey Nguyen, female    DOB: 30-Jan-1941, 72 y.o.   MRN: [id number]  HPI  72 y.o. Marland KitchenF with moderate persistent asthma, allergic rhinitis, OSA, restriction d/t scoliosis  PUL ASTHMA HISTORY 04/04/2013  Symptoms Daily  Nighttime awakenings 0-2/month  Interference with activity Minor limitations  SABA use Daily  Exacerbations requiring oral steroids 0-1 / year    04/04/2013 Chief Complaint  Patient presents with  . 4 month folllow up    Breathing has some improvement since last OV. SOB, coughing with activity.  Overall dyspnea is better.  Pt had a cough, this did get better on its own.  Pt was using a bath gel and this helped   Review of Systems   11 point review of systems taken in detail and is negative except as noted above    Objective:   Physical Exam  Filed Vitals:   04/04/13 1550  BP: 120/84  Pulse: 70  Temp: 98.3 F (36.8 C)  TempSrc: Oral  Height: 4\' 9"  (1.448 m)  Weight: 159 lb 4.8 oz (72.258 kg)  SpO2: 94%    NWG:NFAOZ, in no distress,  normal affect  ENT: No lesions,  mouth clear,  oropharynx clear, no postnasal drip  Neck: No JVD, no TMG, no carotid bruits  Lungs: No use of accessory muscles, no dullness to percussion, clear without rales or rhonchi  Cardiovascular: RRR, heart sounds normal, no murmur or gallops, no peripheral edema  Abdomen: soft and NT, no HSM,  BS normal  Musculoskeletal: No deformities, no cyanosis or clubbing  Neuro: alert, non focal  Skin: Warm, no lesions or rashes  No results found.    Assessment & Plan:   Moderate persistent asthma with allergic factors Moderate persistent asthma stable at this time with significant atopic features Plan Maintain inhaled medications as prescribed Administer Pneumovax    Updated Medication List Outpatient Encounter Prescriptions as of 04/04/2013  Medication Sig Dispense Refill  . acetaminophen (TYLENOL) 325 MG tablet Take 1-2 tablets (325-650  mg total) by mouth every 4 (four) hours as needed.      Marland Kitchen albuterol-ipratropium (COMBIVENT) 18-103 MCG/ACT inhaler Inhale 1 puff into the lungs every 6 (six) hours as needed for wheezing or shortness of breath.  1 Inhaler  6  . budesonide-formoterol (SYMBICORT) 160-4.5 MCG/ACT inhaler Inhale 2 puffs into the lungs 2 (two) times daily.  1 Inhaler  11  . BYSTOLIC 5 MG tablet TAKE 1 TABLET BY MOUTH EVERY DAY  30 tablet  0  . DULoxetine (CYMBALTA) 30 MG capsule       . fluticasone (FLONASE) 50 MCG/ACT nasal spray Place 1-2 sprays into the nose daily.       . furosemide (LASIX) 40 MG tablet Take 1 tablet (40 mg total) by mouth daily before breakfast.  100 tablet  3  . Ipratropium-Albuterol (COMBIVENT RESPIMAT) 20-100 MCG/ACT AERS respimat Inhale 1 puff into the lungs 4 (four) times daily as needed.  1 Inhaler  6  . losartan (COZAAR) 50 MG tablet Take 1 tablet (50 mg total) by mouth daily.  90 tablet  3  . nebivolol (BYSTOLIC) 5 MG tablet Take 5 mg by mouth daily after breakfast.      . omeprazole (PRILOSEC) 20 MG capsule TAKE 1 CAPSULE BY MOUTH ONCE DAILY  90 capsule  0  . polyethylene glycol (MIRALAX / GLYCOLAX) packet Take 17 g by mouth as needed.       . pramipexole (MIRAPEX) 1 MG  tablet Take 1 tablet (1 mg total) by mouth every evening.  90 tablet  3  . [DISCONTINUED] albuterol-ipratropium (COMBIVENT) 18-103 MCG/ACT inhaler Inhale 2 puffs into the lungs every 6 (six) hours as needed for wheezing or shortness of breath.      . [DISCONTINUED] budesonide-formoterol (SYMBICORT) 160-4.5 MCG/ACT inhaler Inhale 2 puffs into the lungs 2 (two) times daily.      . [DISCONTINUED] DULoxetine (CYMBALTA) 30 MG capsule TAKE ONE CAPSULE EVERY DAY  90 capsule  3  . [DISCONTINUED] methocarbamol (ROBAXIN) 500 MG tablet Take 1 tablet (500 mg total) by mouth every 6 (six) hours as needed. For spasms  30 tablet  1  . [DISCONTINUED] omeprazole (PRILOSEC) 20 MG capsule Take 20 mg by mouth daily before breakfast.      .  [DISCONTINUED] traMADol (ULTRAM) 50 MG tablet Take 50 mg by mouth every 6 (six) hours as needed for pain.       No facility-administered encounter medications on file as of 04/04/2013.

## 2013-04-04 NOTE — Patient Instructions (Addendum)
Pneumovax today was given Remember to get flu vaccine this fall No change in medications Return 6 months

## 2013-04-29 IMAGING — CT CT ANGIO CHEST
2 of 6 series · 17 of 36 positions shown · IV contrast (Omnipaque 300)
Comparison: No prior CT.  Two-view chest x-ray 06/15/2011 [REDACTED].

CLINICAL DATA: Chronic chest pain and shortness of breath over the
past several weeks.  History of asthma.

CT ANGIOGRAPHY CHEST WITH CONTRAST 06/18/2011:
TECHNIQUE: Multidetector CT imaging of the chest was performed
using the standard protocol during bolus administration of
intravenous contrast.  Multiplanar CT image reconstructions
including MIPs were obtained to evaluate the vascular anatomy.
Contrast: 80mL OMNIPAQUE IOHEXOL 300 MG/ML IV SOLN

[Series 5: thins (id) / (id) · axial · 0.63mm/px · z∈[-209,-19]mm · 16 of 212 slices shown]
[im 11/212  lung]
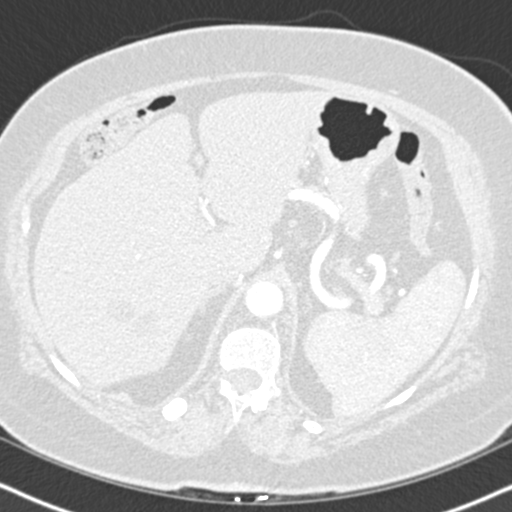
[im 22/212  mediastinal]
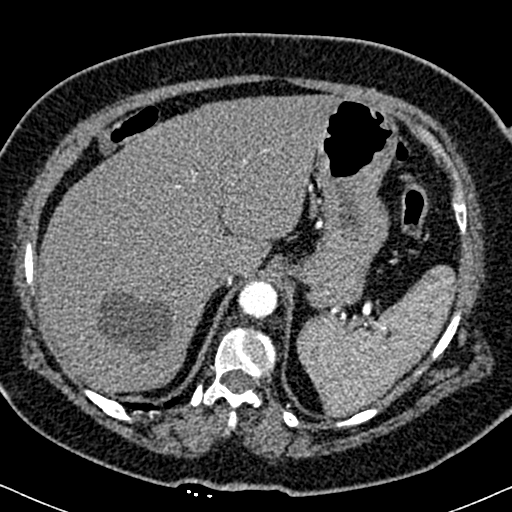
[im 32/212  lung]
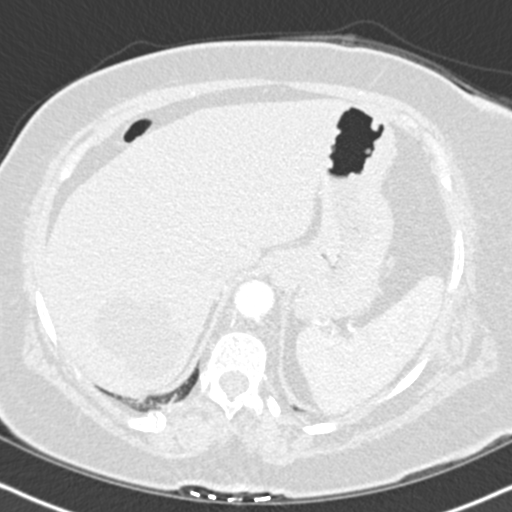
[im 53/212  mediastinal]
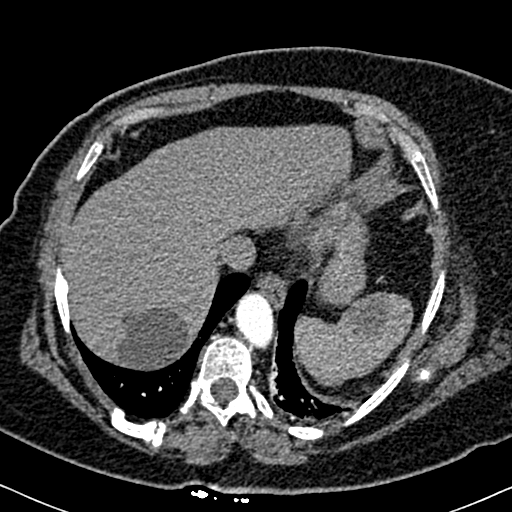
[im 64/212  lung]
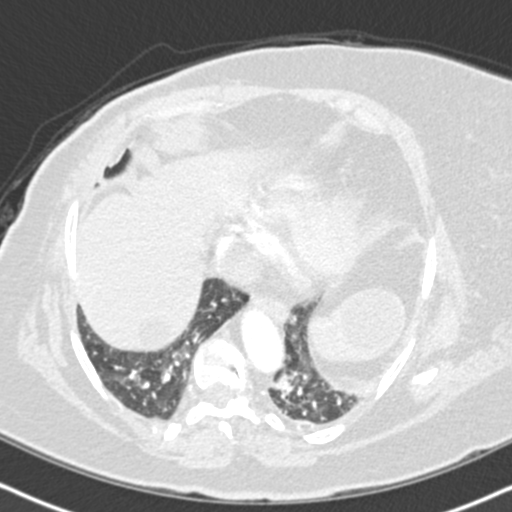
[im 74/212  mediastinal]
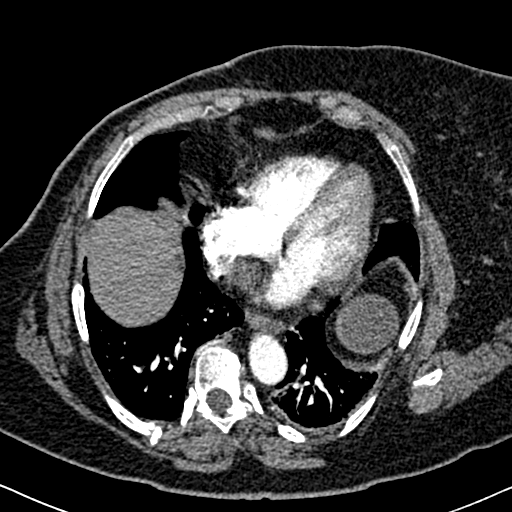
[im 85/212  lung]
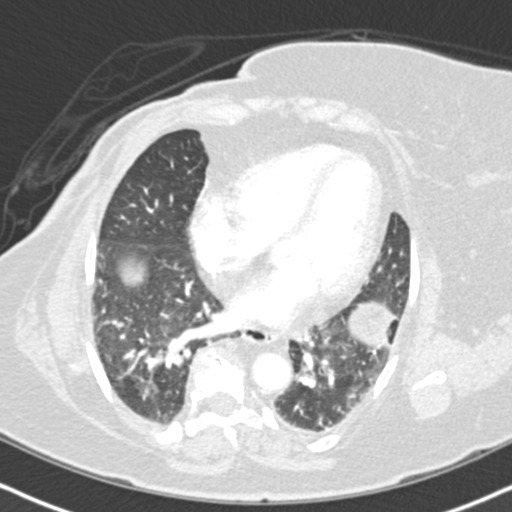
[im 95/212  mediastinal]
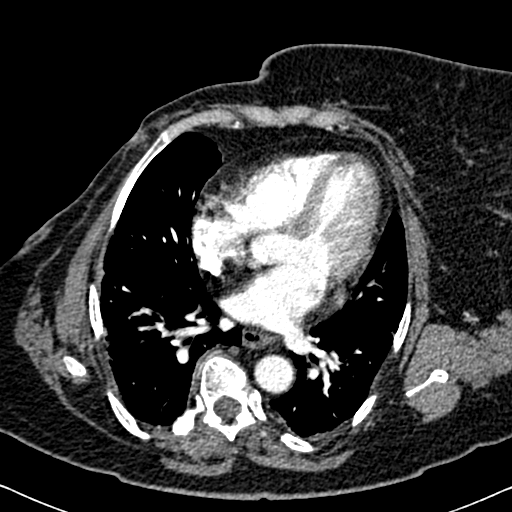
[im 117/212  lung]
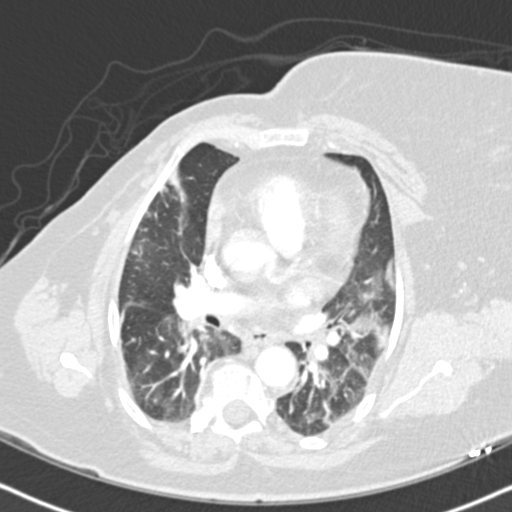
[im 127/212  mediastinal]
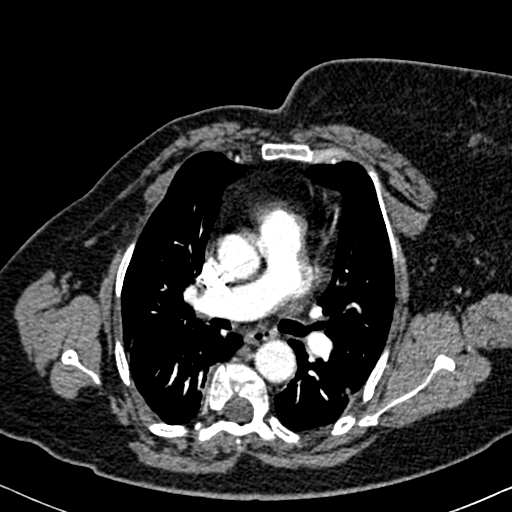
[im 138/212  lung]
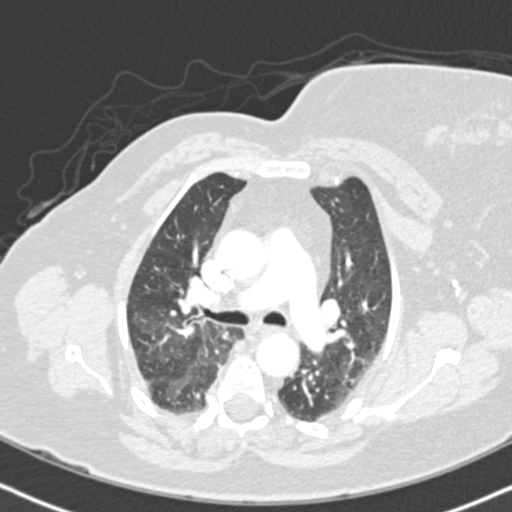
[im 148/212  mediastinal]
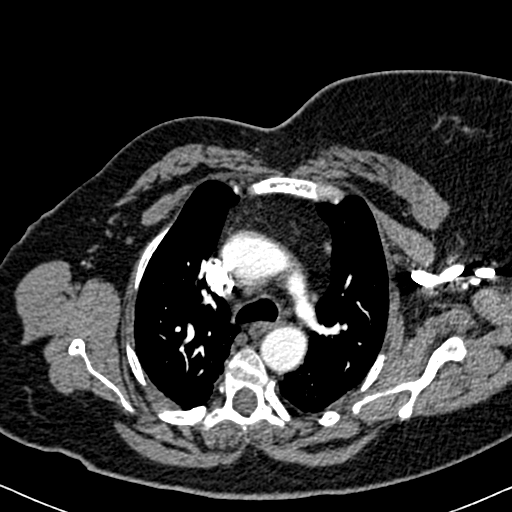
[im 159/212  lung]
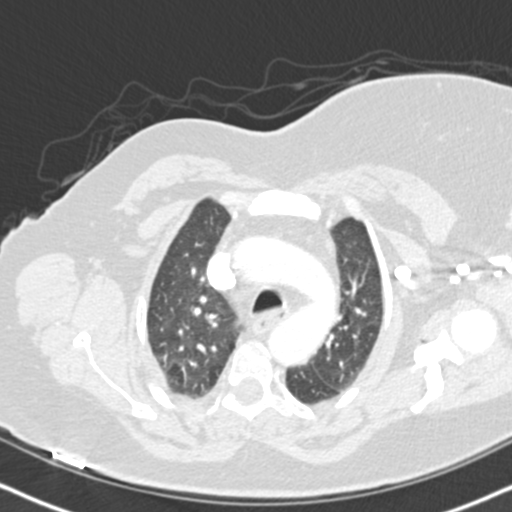
[im 180/212  mediastinal]
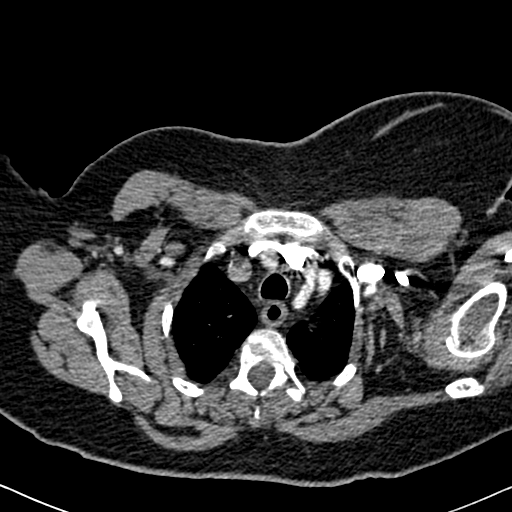
[im 190/212  lung]
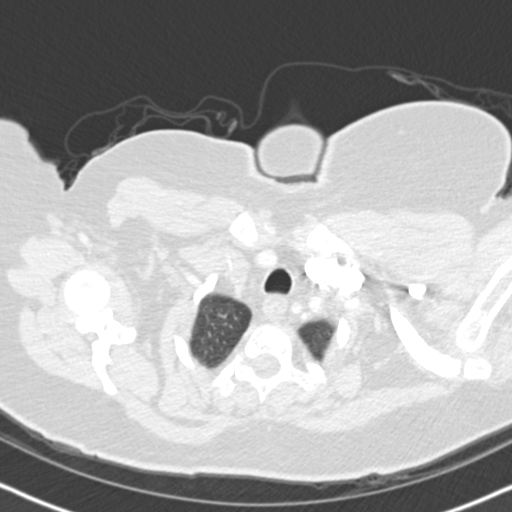
[im 201/212  mediastinal]
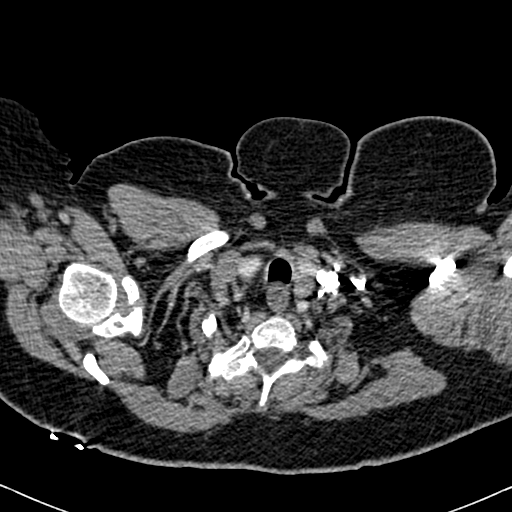

[Series 602: cor · coronal · 0.63mm/px · 1 of 105 slices shown]
[im 53/105  mediastinal]
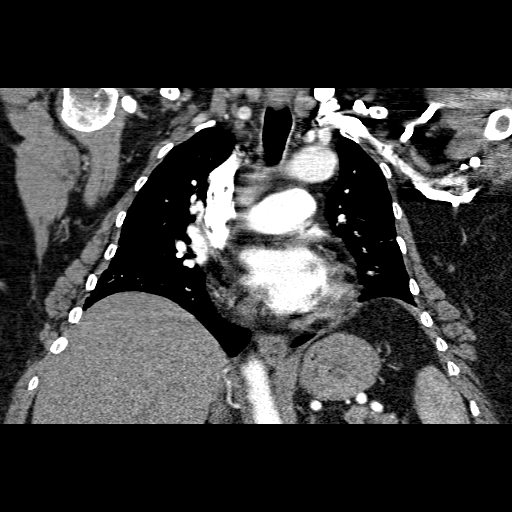

[17 of 36 positions shown; findings below may reference images not displayed]

FINDINGS: Contrast opacification of the pulmonary arteries is
good.  No filling defects within either main pulmonary artery or
their branches in either lung to suggest pulmonary embolism.  Heart
moderately enlarged with left ventricular predominance and
borderline to mild left ventricular hypertrophy.  Minimal LAD and
right coronary calcification.  No pericardial effusion.  Thoracic
aorta tortuous and only mildly atherosclerotic without evidence of
aneurysm or dissection.  Proximal great vessels tortuous but
patent.

Suboptimal inspiration due to body habitus with atelectasis in the
lower lobes.  Scattered areas of hyperlucency throughout both
lungs.  Tiny nodule in the inferior right upper lobe approximating
4 mm (series 6, image 29), with a second oval-shaped nodule
immediately superior measuring approximately 4 mm.  Scarring
anteriorly in the inferior upper lobes bilaterally.  No confluent
airspace consolidation.  No pleural effusions.

Right mastectomy.  No evidence of recurrent tumor in the right
chest wall.  No significant mediastinal, hilar, or axillary
lymphadenopathy.  No right subpectoral nodes.  Visualized thyroid
gland unremarkable.

Approximate 6.0 x 4.5 cm cyst in the anterior segment right lower
lobe of liver.  Approximate 4 cm diameter cyst arising from the
upper pole of the spleen.  Remaining visualized upper abdomen
unremarkable for the early arterial phase of enhancement.  Bone
window images demonstrate thoracic scoliosis convex left, severe
mid thoracic scoliosis, old compression fracture of T7, old healed
sternal fracture, and old fractures involving multiple right
posterior ribs, some of which demonstrate nonunion.

Review of the MIP images confirms the above findings.
IMPRESSION: 1.  No evidence of pulmonary embolism.
2.  Suboptimal inspiration due to body habitus which accounts for
atelectasis in the lower lobes.
3.  Scattered areas of hyperlucency throughout both lungs
consistent with localized air trapping as can be seen in patients
with asthma and/or bronchitis.  No confluent airspace pneumonia.
4.  2 adjacent approximate 4 mm nodules in the inferior right upper
lobe.  No pulmonary nodules elsewhere.
5.  Cardiomegaly with borderline to mild left ventricular
hypertrophy.
6.  Large cysts in the right lobe of liver and the upper pole of
the spleen.
7.  Right mastectomy.  No evidence of recurrent or metastatic
breast cancer.

If the patient is at high risk for bronchogenic carcinoma, follow-
up chest CT at 1 year is recommended.  If the patient is at low
risk, no follow-up is needed.  This recommendation follows the
consensus statement: Guidelines for Management of Small Pulmonary
Nodules Detected on CT Scans:  A Statement from the [employer]
online at:  [URL]

## 2013-04-30 ENCOUNTER — Ambulatory Visit (INDEPENDENT_AMBULATORY_CARE_PROVIDER_SITE_OTHER): Payer: Medicare Other | Admitting: Nurse Practitioner

## 2013-04-30 ENCOUNTER — Encounter: Payer: Self-pay | Admitting: Nurse Practitioner

## 2013-04-30 VITALS — BP 108/57 | HR 74 | Ht <= 58 in | Wt 161.0 lb

## 2013-04-30 DIAGNOSIS — G4733 Obstructive sleep apnea (adult) (pediatric): Secondary | ICD-10-CM

## 2013-04-30 DIAGNOSIS — G2581 Restless legs syndrome: Secondary | ICD-10-CM | POA: Insufficient documentation

## 2013-04-30 NOTE — Progress Notes (Signed)
[facility]  PATIENT: Tracey Nguyen DOB: [birth year]   REASON FOR VISIT: Followup for sleep apnea and restless legs    HISTORY OF PRESENT ILLNESS:72 y.o. female here as a follow up  with apnea and oxygen need. The patient has severe scoliosis and a non comlpiant chest wall. The patient has been followed in this office since 2006. Was last seen by Dr. Vickey Huger 01/12/2013 She  uses CPAP with nasal pillows at night. She also has a history of restless leg syndrome she is currently on Cynbalta and  Mirapex tolerating both without side effects. She is using CPAP for many years her last for a sleep study was in 2007 and her last sleep study in 09/09/2011 at the time the patient had and was the Epworth Sleepiness Scale at 16 points the back inventory at 21 points her BMI was 37.7 her neck circumference measured at 18 inches. She had  increasing fatigue and sleepiness concerns she also needed to take fluid pills for swelling of the ankle and she needed Combivent for her restrictive breathing disorder. She had increasing dyspnea at the time. AHI was 87.7 her RDI of 92.7 her on the 18 [place] obstructive apneas that last majority of events were hypotony understood to shallow breathing and likely attributed to her noncompliant chest wall. The patient was titrated to 10 cm water and the residual AHI was 5.3 after CPAP was initiated her borderline tachycardia was reduced to the 80s in normal sinus rhythm. The patient had the CPAP data downloads and 2013 as well as an overnight pulse oximetry the pulse oximetry from 3-12/13 shows persistent low oxygen levels at or below 88% for 4 hours and 47 minutes at night. CPAP  short residual apnea hypotony or in the disease between 6.9 and 7.7.  04/30/13. Returns for followup today and her restless legs are well controlled on Mirapex and Cymbalta. She remains on CPAP however she did not bring her disc for download . She is using a walker today and has  since  she had a hip pinning in May. She has no new complaints today  REVIEW OF SYSTEMS: Full 14 system review of systems performed and notable only for:  Constitutional: N/A  Cardiovascular: N/A  Ear/Nose/Throat: N/A  Skin: N/A  Eyes: N/A  Respiratory: Shortness of breath, cough, wheezing  Gastroitestinal: N/A  Hematology/Lymphatic: N/A  Endocrine: N/A Musculoskeletal: Aching muscles  Allergy/Immunology: N/A  Neurological: N/A Psychiatric: N/A   ALLERGIES: Allergies  Allergen Reactions  . Codeine     REACTION: spacey    HOME MEDICATIONS: Outpatient Prescriptions Prior to Visit  Medication Sig Dispense Refill  . acetaminophen (TYLENOL) 325 MG tablet Take 1-2 tablets (325-650 mg total) by mouth every 4 (four) hours as needed.      Marland Kitchen albuterol-ipratropium (COMBIVENT) 18-103 MCG/ACT inhaler Inhale 1 puff into the lungs every 6 (six) hours as needed for wheezing or shortness of breath.  1 Inhaler  6  . budesonide-formoterol (SYMBICORT) 160-4.5 MCG/ACT inhaler Inhale 2 puffs into the lungs 2 (two) times daily.  1 Inhaler  11  . BYSTOLIC 5 MG tablet TAKE 1 TABLET BY MOUTH EVERY DAY  30 tablet  0  . DULoxetine (CYMBALTA) 30 MG capsule       . fluticasone (FLONASE) 50 MCG/ACT nasal spray Place 1-2 sprays into the nose daily.       . furosemide (LASIX) 40 MG tablet Take 1 tablet (40 mg total) by mouth daily before breakfast.  100 tablet  3  . Ipratropium-Albuterol (COMBIVENT RESPIMAT) 20-100 MCG/ACT AERS respimat Inhale 1 puff into the lungs 4 (four) times daily as needed.  1 Inhaler  6  . losartan (COZAAR) 50 MG tablet Take 1 tablet (50 mg total) by mouth daily.  90 tablet  3  . nebivolol (BYSTOLIC) 5 MG tablet Take 5 mg by mouth daily after breakfast.      . omeprazole (PRILOSEC) 20 MG capsule TAKE 1 CAPSULE BY MOUTH ONCE DAILY  90 capsule  0  . polyethylene glycol (MIRALAX / GLYCOLAX) packet Take 17 g by mouth as needed.       . pramipexole (MIRAPEX) 1 MG tablet Take 1 tablet (1 mg  total) by mouth every evening.  90 tablet  3   No facility-administered medications prior to visit.    PAST MEDICAL HISTORY: Past Medical History  Diagnosis Date  . Asthma   . Arthritis   . Cancer 2007    breast cancer  . GERD (gastroesophageal reflux disease)   . Hypertension   . Osteoporosis   . Sleep apnea     on cpap  . Blood transfusion   . Cataract     BILATERAL -REMOVED VIA SURGERY  . History of D&C   . Pneumonia     hx of  . Headache(784.0)     hx of migraines  . Complication of anesthesia     hard to wake up in 1970  . Alpha thalassemia minor trait     PAST SURGICAL HISTORY: Past Surgical History  Procedure Laterality Date  . Femur fracture surgery  2011    titanium rod/right  . Mastectomy  2007    right  . Hemorrhoidectomy  1987  . Breast biopsy  1995    left  . Cataract extraction, bilateral  2006  . Dilation and curettage of uterus    . Femur im nail Right 10/30/2012    Procedure: revision right femur INTRAMEDULLARY NAILING;  Surgeon: [name], MD;  Location: [facility];  Service: Orthopedics;  Laterality: Right;  . Hip surgery  10/30/12    FAMILY HISTORY: History reviewed. No pertinent family history.  SOCIAL HISTORY: History   Social History  . Marital Status: Married    Spouse Name: N/A    Number of Children: N/A  . Years of Education: N/A   Occupational History  . Not on file.   Social History Main Topics  . Smoking status: Former Smoker    Quit date: 08/09/1964  . Smokeless tobacco: Never Used  . Alcohol Use: No  . Drug Use: No  . Sexual Activity: No   Other Topics Concern  . Not on file   Social History Narrative   patient needs to be seen face to face for oxygen qualification, RR is 16 , min. mallompati 4 and co2 retention from scoliosis overlap with severe OSA - AHI was 87 at baseline. titrated to 13 cm water CPAP, [facility] to follow.            PHYSICAL EXAM  Filed Vitals:   04/30/13 1414  BP: 108/57  Pulse: 74    Height: 4\' 9"  (1.448 m)  Weight: 161 lb (73.029 kg)   Body mass index is 34.83 kg/(m^2).  Generalized: Well developed, in no acute distress  Head: normocephalic and atraumatic,. Oropharynx benign  Neck: Supple, no carotid bruits , Mallampatti 4 Cardiac: Regular rate rhythm, no murmur  Musculoskeletal: Severe scoliosis Neurological examination   Mentation: Alert oriented to time, place, history  taking. Follows all commands speech and language fluent  Cranial nerve II-XII: Fundoscopic exam reveals sharp disc margins.Pupils were equal round reactive to light extraocular movements were full, visual field were full on confrontational test. Facial sensation and strength were normal. hearing was intact to finger rubbing bilaterally. Uvula tongue midline. head turning and shoulder shrug and were normal and symmetric.Tongue protrusion into cheek strength was normal. Motor: normal bulk and tone, full strength in the BUE, BLE, fine finger movements normal, no pronator drift. No focal weakness Sensory: normal and symmetric to light touch, pinprick, and  vibration  Coordination: finger-nose-finger,normal  no dysmetria Reflexes: Symmetric upper and lower  Gait and Station: Ambulates with a rolling walker easily short of breath, no difficulty with turns has a limp .   DIAGNOSTIC DATA (LABS, IMAGING, TESTING) - I reviewed patient records, labs, notes, testing and imaging myself where available.  Lab Results  Component Value Date   WBC 10.6* 11/15/2012   HGB 12* 12/07/2012   HCT 28.7* 11/15/2012   MCV 72.3* 11/15/2012   PLT 262 11/15/2012      Component Value Date/Time   NA 139 11/15/2012 0607   K 4.6 11/15/2012 0607   CL 101 11/15/2012 0607   CO2 30 11/15/2012 0607   GLUCOSE 104* 11/15/2012 0607   BUN 18 11/15/2012 0607   CREATININE 1.33* 11/15/2012 0607   CALCIUM 9.6 11/15/2012 0607   PROT 5.4* 11/03/2012 0550   ALBUMIN 2.5* 11/03/2012 0550   AST 12 11/03/2012 0550   ALT 5 11/03/2012 0550   ALKPHOS 73 11/03/2012  0550   BILITOT 0.3 11/03/2012 0550   GFRNONAA 39* 11/15/2012 0607   GFRAA 45* 11/15/2012 0607   Lab Results  Component Value Date   CHOL 173 10/20/2012   HDL 30.40* 10/20/2012   LDLCALC 115* 10/20/2012   LDLDIRECT 141.0 12/23/2009   TRIG 137.0 10/20/2012   CHOLHDL 6 10/20/2012   Lab Results  Component Value Date   HGBA1C 6.3 10/20/2012   Lab Results  Component Value Date   VITAMINB12 273 01/21/2010   Lab Results  Component Value Date   TSH 3.06 10/20/2012      ASSESSMENT AND PLAN  72 y.o. year old female  has a past medical history of Asthma; Arthritis;  Hypertension; Osteoporosis; Sleep apnea;  Headache(784.0); here for followup. She did not bring CPAP disc  today for download  Pt to bring in CPAP machine for download.  Continue Mirapex for restless legs , does not need refills ESS 12 today, last testing was 18 F/U in 6 months with Dr. Vickey Huger Nilda Riggs, [place], [facility], APRN  [facility] [address] [place], [place] [address] [phone]

## 2013-04-30 NOTE — Patient Instructions (Addendum)
Pt to bring in CPAP machine for download.  Continue Mirapex for restless legs  ESS 12 today F/U in 6 months with Dr. Vickey Huger

## 2013-05-28 ENCOUNTER — Other Ambulatory Visit: Payer: Self-pay

## 2013-05-28 DIAGNOSIS — Z1231 Encounter for screening mammogram for malignant neoplasm of breast: Secondary | ICD-10-CM

## 2013-05-30 ENCOUNTER — Other Ambulatory Visit: Payer: Self-pay | Admitting: Critical Care Medicine

## 2013-05-31 ENCOUNTER — Encounter: Payer: Self-pay | Admitting: Neurology

## 2013-06-29 ENCOUNTER — Ambulatory Visit
Admission: RE | Admit: 2013-06-29 | Discharge: 2013-06-29 | Disposition: A | Discharge status: Home / Self Care | Payer: Medicare Other | Source: Ambulatory Visit

## 2013-06-29 DIAGNOSIS — Z1231 Encounter for screening mammogram for malignant neoplasm of breast: Secondary | ICD-10-CM

## 2013-07-27 ENCOUNTER — Ambulatory Visit (INDEPENDENT_AMBULATORY_CARE_PROVIDER_SITE_OTHER): Payer: Medicare Other | Admitting: Adult Health

## 2013-07-27 ENCOUNTER — Encounter: Payer: Self-pay | Admitting: Adult Health

## 2013-07-27 VITALS — BP 132/68 | HR 74 | Temp 98.2°F | Ht <= 58 in | Wt 163.4 lb

## 2013-07-27 DIAGNOSIS — J45909 Unspecified asthma, uncomplicated: Secondary | ICD-10-CM

## 2013-07-27 MED ORDER — AZITHROMYCIN 250 MG PO TABS: ORAL_TABLET | ORAL | Status: AC

## 2013-07-27 MED ORDER — HYDROCODONE-HOMATROPINE 5-1.5 MG/5ML PO SYRP: 2.5000 mL | ORAL_SOLUTION | Freq: Four times a day (QID) | ORAL | Status: DC | PRN

## 2013-07-27 MED ORDER — PREDNISONE 10 MG PO TABS: ORAL_TABLET | ORAL | Status: DC

## 2013-07-27 NOTE — Assessment & Plan Note (Signed)
Exacerbation, with acute bronchitis Xopenex nebulizer given in the office   Plan  Z-Pak take as directed.  Prednisone taper. ove the next week. Mucinex DM twice daily as needed. For cough and congestion. Hydromet 1/2 teaspoon every 6 hours as needed. For cough, may make you sleepy Follow up with Dr. Delford Field as planned and as needed Please contact office for sooner follow up if symptoms do not improve or worsen or seek emergency care

## 2013-07-27 NOTE — Progress Notes (Signed)
  Subjective:    Patient ID: Tracey Nguyen, female    DOB: 05-Sep-1940, 72 y.o.   MRN: [id number]  HPI 72 y.o.F with moderate persistent asthma, allergic rhinitis, OSA, restriction d/t scoliosis   07/27/2013 Acute OV  Complains of asthma flare with increased SOB, wheezing cough occasionally producing clear/green/yellow mucus, chest tightness x1 week.  Denies f/c/s, hemoptysis, nausea, vomiting.  Patient has postnasal drainage. Cough is keeping her up at night. She denies any hemoptysis, orthopnea, PND, or leg swelling. Has had no recent travel or antibiotic use     Review of Systems  Constitutional:   No  weight loss, night sweats,  Fevers, chills,  +fatigue, or  lassitude.  HEENT:   No headaches,  Difficulty swallowing,  Tooth/dental problems, or  Sore throat,                No sneezing, itching, ear ache, + nasal congestion, post nasal drip,   CV:  No chest pain,  Orthopnea, PND, swelling in lower extremities, anasarca, dizziness, palpitations, syncope.   GI  No heartburn, indigestion, abdominal pain, nausea, vomiting, diarrhea, change in bowel habits, loss of appetite, bloody stools.   Resp:   No coughing up of blood.     No chest wall deformity  Skin: no rash or lesions.  GU: no dysuria, change in color of urine, no urgency or frequency.  No flank pain, no hematuria   MS:  No joint pain or swelling.  No decreased range of motion.  No back pain.  Psych:  No change in mood or affect. No depression or anxiety.  No memory loss.          Objective:   Physical Exam Gen: Pleasant, obese, in no distress,  normal affect  ENT: No lesions,  mouth clear,  oropharynx clear, purulent nares Neck: No JVD, no TMG, no carotid bruits  Lungs: No use of accessory muscles, no dullness to percussion, distant BS, no wheeze   Cardiovascular: RRR, heart sounds normal, no murmur or gallops, no  peripheral edema  Abdomen: soft and NT, no HSM,  BS normal  Musculoskeletal:  kyphoscoliosis,  no cyanosis or clubbing  Neuro: alert, non focal  Skin: Warm, no lesions or rashes         Assessment & Plan:

## 2013-07-27 NOTE — Patient Instructions (Signed)
Z-Pak take as directed.  Prednisone taper. ove the next week. Mucinex DM twice daily as needed. For cough and congestion. Hydromet 1/2 teaspoon every 6 hours as needed. For cough, may make you sleepy Follow up with Dr. Delford Field as planned and as needed Please contact office for sooner follow up if symptoms do not improve or worsen or seek emergency care

## 2013-08-24 ENCOUNTER — Encounter: Payer: Self-pay | Admitting: Emergency Medicine

## 2013-08-24 ENCOUNTER — Telehealth: Payer: Self-pay | Admitting: Critical Care Medicine

## 2013-08-24 ENCOUNTER — Ambulatory Visit (INDEPENDENT_AMBULATORY_CARE_PROVIDER_SITE_OTHER): Payer: Medicare Other | Admitting: Emergency Medicine

## 2013-08-24 VITALS — BP 128/80 | HR 78 | Ht <= 58 in | Wt 169.6 lb

## 2013-08-24 DIAGNOSIS — G4733 Obstructive sleep apnea (adult) (pediatric): Secondary | ICD-10-CM

## 2013-08-24 DIAGNOSIS — J45909 Unspecified asthma, uncomplicated: Secondary | ICD-10-CM

## 2013-08-24 DIAGNOSIS — R0989 Other specified symptoms and signs involving the circulatory and respiratory systems: Secondary | ICD-10-CM

## 2013-08-24 DIAGNOSIS — R0609 Other forms of dyspnea: Secondary | ICD-10-CM

## 2013-08-24 DIAGNOSIS — R0602 Shortness of breath: Secondary | ICD-10-CM

## 2013-08-24 MED ORDER — PREDNISONE 10 MG PO TABS: ORAL_TABLET | ORAL | Status: DC

## 2013-08-24 NOTE — Addendum Note (Signed)
Addended by: Carlos American A on: 08/24/2013 12:13 PM   Modules accepted: Orders

## 2013-08-24 NOTE — Telephone Encounter (Signed)
Seen today. 

## 2013-08-24 NOTE — Assessment & Plan Note (Signed)
Tolerates CPAP, good compliance

## 2013-08-24 NOTE — Assessment & Plan Note (Addendum)
Not wheezing today. Her sx are all consistent with recurrent UA irritation, cough, stridor and associated dyspnea. I think her problem is a more chronic one. I dont think this is an acute exacerbation but she is asking for prednisone which will likely help short term, but I predict these sx will come right back. She c/o back pain, likely arthritis + scoliosis > pred will probably help also. I explained to her that the chronic management of these issues will likely be more complicated and that she will need to discuss w Dr Joya Gaskins  She is hypoxic on ambulation into the room today. Will confirm this today, order O2 w exertion, if indicated.

## 2013-08-24 NOTE — Telephone Encounter (Signed)
Called and spoke with pt  And she stated that she is having cough and nasal congestion with light green congestion, sitting is ok but when she gets up and moves around she becomes SOB, pain across her back.  She has been using her inhaler that helps some.   Pt is requesting something be called in today.   Last seen TP on 12/19 for the same.  RB please advise. Thanks  Allergies  Allergen Reactions  . Codeine     REACTION: spacey     Current Outpatient Prescriptions on File Prior to Visit  Medication Sig Dispense Refill  . acetaminophen (TYLENOL) 325 MG tablet Take 1-2 tablets (325-650 mg total) by mouth every 4 (four) hours as needed.      Marland Kitchen albuterol-ipratropium (COMBIVENT) 18-103 MCG/ACT inhaler Inhale 1 puff into the lungs every 6 (six) hours as needed for wheezing or shortness of breath.  1 Inhaler  6  . amoxicillin (AMOXIL) 500 MG capsule Take as needed prior to dental procedure      . budesonide-formoterol (SYMBICORT) 160-4.5 MCG/ACT inhaler Inhale 2 puffs into the lungs 2 (two) times daily.  1 Inhaler  11  . BYSTOLIC 5 MG tablet TAKE 1 TABLET BY MOUTH ONCE DAILY  30 tablet  2  . DULoxetine (CYMBALTA) 30 MG capsule Take 30 mg by mouth daily.       . fluticasone (FLONASE) 50 MCG/ACT nasal spray Place 1-2 sprays into the nose daily.       . furosemide (LASIX) 40 MG tablet Take 1 tablet (40 mg total) by mouth daily before breakfast.  100 tablet  3  . HYDROcodone-homatropine (HYDROMET) 5-1.5 MG/5ML syrup Take 2.5 mLs by mouth every 6 (six) hours as needed for cough.  240 mL  0  . Ipratropium-Albuterol (COMBIVENT RESPIMAT) 20-100 MCG/ACT AERS respimat Inhale 1 puff into the lungs 4 (four) times daily as needed.  1 Inhaler  6  . losartan (COZAAR) 50 MG tablet Take 1 tablet (50 mg total) by mouth daily.  90 tablet  3  . methocarbamol (ROBAXIN) 500 MG tablet Take 500 mg by mouth 3 (three) times daily as needed.       Marland Kitchen omeprazole (PRILOSEC) 20 MG capsule TAKE 1 CAPSULE BY MOUTH ONCE DAILY  90  capsule  0  . polyethylene glycol (MIRALAX / GLYCOLAX) packet Take 17 g by mouth as needed.       . pramipexole (MIRAPEX) 1 MG tablet Take 1 tablet (1 mg total) by mouth every evening.  90 tablet  3  . predniSONE (DELTASONE) 10 MG tablet 4 tabs for 2 days, then 3 tabs for 2 days, 2 tabs for 2 days, then 1 tab for 2 days, then stop  20 tablet  0  . traMADol (ULTRAM) 50 MG tablet Take 50 mg by mouth every 6 (six) hours as needed.        No current facility-administered medications on file prior to visit.

## 2013-08-24 NOTE — Progress Notes (Signed)
  Subjective:    Patient ID: Tracey Nguyen, female    DOB: Apr 10, 1941, 73 y.o.   MRN: [id number]  HPI 73 y.o.F with moderate persistent asthma, allergic rhinitis, OSA, restriction d/t scoliosis  07/27/13 -- Acute OV  Complains of asthma flare with increased SOB, wheezing cough occasionally producing clear/green/yellow mucus, chest tightness x1 week.  Denies f/c/s, hemoptysis, nausea, vomiting.  Patient has postnasal drainage. Cough is keeping her up at night. She denies any hemoptysis, orthopnea, PND, or leg swelling. Has had no recent travel or antibiotic use  Acute OV 08/24/13 -- followed by Dr Joya Gaskins for moderate persistent asthma, allergies, chronic cough.  She was treated for an AE 12/19 that consisted of dyspnea, cough, chest tightness. She returns today c/o L sided back pain, L shoulder pain. Has DOE with minimal exertion, walking through the house. The pain makes it difficult to breathe. She also has some exertional cough and wheeze.  She is on symbicort and prn combivent, uses about 2x a day. She has chronic rhinitis. Good compliance    Objective:   Physical Exam Filed Vitals:   08/24/13 1140  BP: 128/80  Pulse: 78  Height: 4\' 9"  (1.448 m)  Weight: 169 lb 9.6 oz (76.93 kg)  SpO2: 99%    Gen: Pleasant, obese, scoliotic, in no distress,  normal affect  ENT: No lesions,  mouth clear,  oropharynx clear, purulent nares Neck: No JVD, no TMG, no carotid bruits, mild stridor intermittently  Lungs: No use of accessory muscles, distant BS, no wheeze   Cardiovascular: RRR, heart sounds normal, no murmur or gallops, no  peripheral edema  Abdomen: soft and NT, no HSM,  BS normal  Musculoskeletal: kyphoscoliosis,  no cyanosis or clubbing  Neuro: alert, non focal  Skin: Warm, no lesions or rashes       Assessment & Plan:  Obstructive sleep apnea Tolerates CPAP, good compliance  Moderate persistent asthma with allergic factors Not wheezing today. Her sx are all  consistent with recurrent UA irritation, cough, stridor and associated dyspnea. I think her problem is a more chronic one. I dont think this is an acute exacerbation but she is asking for prednisone which will likely help short term, but I predict these sx will come right back. She c/o back pain, likely arthritis + scoliosis > pred will probably help also. I explained to her that the chronic management of these issues will likely be more complicated and that she will need to discuss w Dr Joya Gaskins  She is hypoxic on ambulation into the room today. Will confirm this today, order O2 w exertion, if indicated.

## 2013-08-24 NOTE — Patient Instructions (Signed)
We will start oxygen with exertion, 2L/min Take prednisone as directed Follow with Dr Joya Gaskins in 2-3 weeks

## 2013-09-07 ENCOUNTER — Encounter: Payer: Self-pay | Admitting: Critical Care Medicine

## 2013-09-07 ENCOUNTER — Ambulatory Visit (INDEPENDENT_AMBULATORY_CARE_PROVIDER_SITE_OTHER): Payer: Medicare Other | Admitting: Critical Care Medicine

## 2013-09-07 VITALS — BP 118/84 | HR 82 | Temp 98.0°F | Ht <= 58 in | Wt 168.0 lb

## 2013-09-07 DIAGNOSIS — J45909 Unspecified asthma, uncomplicated: Secondary | ICD-10-CM

## 2013-09-07 NOTE — Progress Notes (Signed)
Subjective:    Patient ID: Tracey Nguyen, female    DOB: [birth year], 73 y.o.   MRN: [id number]  HPI  Subjective:    Patient ID: Tracey Nguyen, female    DOB: 1940/10/04, 73 y.o.   MRN: [id number]  HPI 73 y.o.F with moderate persistent asthma, allergic rhinitis, OSA, restriction d/t scoliosis  07/27/13 -- Acute OV  Complains of asthma flare with increased SOB, wheezing cough occasionally producing clear/green/yellow mucus, chest tightness x1 week.  Denies f/c/s, hemoptysis, nausea, vomiting.  Patient has postnasal drainage. Cough is keeping her up at night. She denies any hemoptysis, orthopnea, PND, or leg swelling. Has had no recent travel or antibiotic use  Acute OV 08/24/13 -- followed by Dr Joya Gaskins for moderate persistent asthma, allergies, chronic cough.  She was treated for an AE 12/19 that consisted of dyspnea, cough, chest tightness. She returns today c/o L sided back pain, L shoulder pain. Has DOE with minimal exertion, walking through the house. The pain makes it difficult to breathe. She also has some exertional cough and wheeze.  She is on symbicort and prn combivent, uses about 2x a day. She has chronic rhinitis. Good compliance   09/07/2013 Chief Complaint  Patient presents with  . Asthma    Breathing is improved. Reports DOE. Denies chest tightness, coughing or wheezing.   Rx pred pulse 1/16. Now less dyspneic , made pt hungry Now no chest pain.  Not as much wheeze. No mucus. Did have green mucus.  Pt had some ABX in 07/2013 Now off prednisone.  Now no fever. Off all ABX  Objective:   Physical Exam Filed Vitals:   09/07/13 1453 09/07/13 1455  BP:  118/84  Pulse:  82  Temp: 98 F (36.7 C)   TempSrc: Oral   Height: 4\' 9"  (1.448 m)   Weight: 168 lb (76.204 kg)   SpO2:  95%    Gen: Pleasant, obese, scoliotic, in no distress,  normal affect  ENT: No lesions,  mouth clear,  oropharynx clear, purulent nares Neck: No JVD, no TMG, no carotid bruits,  mild stridor intermittently  Lungs: No use of accessory muscles, distant BS, no wheeze   Cardiovascular: RRR, heart sounds normal, no murmur or gallops, no  peripheral edema  Abdomen: soft and NT, no HSM,  BS normal  Musculoskeletal: kyphoscoliosis,  no cyanosis or clubbing  Neuro: alert, non focal  Skin: Warm, no lesions or rashes       Assessment & Plan:  No problem-specific assessment & plan notes found for this encounter.      Review of Systems     Objective:   Physical Exam Filed Vitals:   09/07/13 1453 09/07/13 1455  BP:  118/84  Pulse:  82  Temp: 98 F (36.7 C)   TempSrc: Oral   Height: 4\' 9"  (1.448 m)   Weight: 168 lb (76.204 kg)   SpO2:  95%    Gen: Pleasant, obese , in no distress,  normal affect  ENT: No lesions,  mouth clear,  oropharynx clear, no postnasal drip  Neck: No JVD, no TMG, no carotid bruits  Lungs: No use of accessory muscles, no dullness to percussion, clear without rales or rhonchi, moderate scoliosis  Cardiovascular: RRR, heart sounds normal, no murmur or gallops, no peripheral edema  Abdomen: soft and NT, no HSM,  BS normal  Musculoskeletal: No deformities, no cyanosis or clubbing  Neuro: alert, non focal  Skin: Warm, no lesions or rashes  No results found.  Assessment & Plan:   Moderate persistent asthma with allergic factors Recent tracheobronchitis with flare , now improved Plan No change in inhaled or maintenance medications. Return in  3 months   Updated Medication List Outpatient Encounter Prescriptions as of 09/07/2013  Medication Sig  . acetaminophen (TYLENOL) 325 MG tablet Take 1-2 tablets (325-650 mg total) by mouth every 4 (four) hours as needed.  . budesonide-formoterol (SYMBICORT) 160-4.5 MCG/ACT inhaler Inhale 2 puffs into the lungs 2 (two) times daily.  Marland Kitchen BYSTOLIC 5 MG tablet TAKE 1 TABLET BY MOUTH ONCE DAILY  . DULoxetine (CYMBALTA) 30 MG capsule Take 30 mg by mouth daily.   . fluticasone  (FLONASE) 50 MCG/ACT nasal spray Place 1-2 sprays into the nose daily.   . furosemide (LASIX) 40 MG tablet Take 1 tablet (40 mg total) by mouth daily before breakfast.  . HYDROcodone-homatropine (HYDROMET) 5-1.5 MG/5ML syrup Take 2.5 mLs by mouth every 6 (six) hours as needed for cough.  . Ipratropium-Albuterol (COMBIVENT RESPIMAT) 20-100 MCG/ACT AERS respimat Inhale 1 puff into the lungs 4 (four) times daily as needed.  Marland Kitchen losartan (COZAAR) 50 MG tablet Take 1 tablet (50 mg total) by mouth daily.  . methocarbamol (ROBAXIN) 500 MG tablet Take 500 mg by mouth 3 (three) times daily as needed.   Marland Kitchen omeprazole (PRILOSEC) 20 MG capsule TAKE 1 CAPSULE BY MOUTH ONCE DAILY  . polyethylene glycol (MIRALAX / GLYCOLAX) packet Take 17 g by mouth as needed.   . pramipexole (MIRAPEX) 1 MG tablet Take 1 tablet (1 mg total) by mouth every evening.  . traMADol (ULTRAM) 50 MG tablet Take 50 mg by mouth every 6 (six) hours as needed.   . [DISCONTINUED] predniSONE (DELTASONE) 10 MG tablet Take 40mg  daily for 3 days, then 30mg  daily for 3 days, then 20mg  daily for 3 days, then 10mg  daily for 3 days, then stop  . [DISCONTINUED] albuterol-ipratropium (COMBIVENT) 18-103 MCG/ACT inhaler Inhale 1 puff into the lungs every 6 (six) hours as needed for wheezing or shortness of breath.

## 2013-09-07 NOTE — Patient Instructions (Signed)
No change in medications. Return in        2 months 

## 2013-09-08 NOTE — Assessment & Plan Note (Signed)
Recent tracheobronchitis with flare , now improved Plan No change in inhaled or maintenance medications. Return in  3 months

## 2013-09-20 ENCOUNTER — Other Ambulatory Visit: Payer: Self-pay | Admitting: Critical Care Medicine

## 2013-10-12 ENCOUNTER — Ambulatory Visit: Payer: Medicare Other | Admitting: Nurse Practitioner

## 2013-10-18 ENCOUNTER — Other Ambulatory Visit: Payer: Self-pay | Admitting: Critical Care Medicine

## 2013-10-30 ENCOUNTER — Ambulatory Visit: Payer: Medicare Other | Admitting: Neurology

## 2013-11-02 ENCOUNTER — Encounter: Payer: Self-pay | Admitting: Neurology

## 2013-11-02 ENCOUNTER — Ambulatory Visit (INDEPENDENT_AMBULATORY_CARE_PROVIDER_SITE_OTHER): Payer: Medicare Other | Admitting: Neurology

## 2013-11-02 VITALS — BP 122/84 | HR 84 | Resp 18 | Ht <= 58 in | Wt 169.0 lb

## 2013-11-02 DIAGNOSIS — G2581 Restless legs syndrome: Secondary | ICD-10-CM

## 2013-11-02 DIAGNOSIS — J989 Respiratory disorder, unspecified: Secondary | ICD-10-CM

## 2013-11-02 DIAGNOSIS — Z9981 Dependence on supplemental oxygen: Secondary | ICD-10-CM

## 2013-11-02 DIAGNOSIS — G4736 Sleep related hypoventilation in conditions classified elsewhere: Secondary | ICD-10-CM

## 2013-11-02 DIAGNOSIS — G609 Hereditary and idiopathic neuropathy, unspecified: Secondary | ICD-10-CM

## 2013-11-02 MED ORDER — PRAMIPEXOLE DIHYDROCHLORIDE 1 MG PO TABS: 1.5000 mg | ORAL_TABLET | Freq: Every evening | ORAL | Status: DC

## 2013-11-02 NOTE — Progress Notes (Signed)
[facility]  Provider:  Dr Logyn Kendrick Referring Provider: Dorena Cookey, MD Primary Care Physician:  Tracey Man, MD  Chief Complaint  Patient presents with  . Follow-up    Room 11  . Sleep Apnea    HPI:  Tracey Nguyen is a 73 y.o. female here as a  Follow up from  Dr. Sherren Mocha for  ED Sleepiness , with apnea and oxygen need due to hypoventilation. The patient has severe scoliosis and a non compliant chest wall.   She has been followed in this office since 2006. Last seen 6 month ago by my NP.  The patient , a 73 year old  Caucasian female patient, married,  right-handed ,returns for followup.  She has a history of  obstructive apnea and uses CPAP with nasal pillows at night. She also has a history of restless leg syndrome- she is currently on Cymbalta and Mirapex tolerating both without side effects.   She is using CPAP for many years her last for a sleep study was in 2007 and her last sleep study in 09/09/2011 . At the time the patient had and was the Epworth Sleepiness Scale at 16 points the back inventory at 21 points her BMI was 37.7 her neck circumference measured at 18 inches.  She had to increasing fatigue and sleepiness concerns she also needed to take fluid pills for swelling of the ankle and she needed Combivent for her restrictive breathing disorder. She had increasing dyspnea at the time.   AHI was 87.7 her RDI of 92.7 , obstructive apneas in the majority of events , shallow breathing - likely attributed to her noncompliant chest wall.  The patient was titrated to 10 cm water and the residual AHI was 5.3 after CPAP was initiated her borderline tachycardia was reduced to the 80s in normal sinus rhythm. The patient had the CPAP data downloads in 2013,  as well as an overnight pulse oximetry.  The pulse oximetry from 10-1211 short persistent low oxygen levels at or below 88% for 4 hours and 47 minutes at night.  CPAP - residual apnea hypopnea index   between 6.9 and 7.7.   To date in the office data download shows a set pressure of 13 cm water with a EPR of 2 centimeter. The patient's  Residual AHI of 6.9 is average . She uses  Nightly her CPAP  machine for  4 hours and 37 minutes- she uses her machine every day but some nights less than 4 hours depending on well-being and sleeptime.  He uses a nasal mask. The patient has to be seen today for a face-to-face qualification for oxygen respiratory rate at baseline is 17- Mallampati 4- and CO2 retention from scoliosis overlap the severe apnea hypotony or was confirmed advanced on care was to set her up this oxygen 2 L at night. In the meantime she has been Dr. Joya Gaskins, her pulmonologist prescribed continued Combivent but also an air treatment  Symbicort.  Today's Epworth sleepiness score is 18 points, the patient is not short of breath at rest but with minimal exertion. Has irregular sleep habits and bedtimes. The patient has nights which he goes to bed at 1 AM and again rises at 3 AM, we are working on a sleep hygiene routine that would allow her to go to bed between 11 and midnight and to rise between 5 and 6 AM.  Due to her chest wall and anatomy and her hypoventilation resulting, plus asthma ,  the patient is not  a candidate for a opiate pain medication or a sedative sleep aid.    She has a high level of air leakage that she is not truly impaired by it or feels this is not significant enough side effect.   13-26-15 her residual AHI is 8.3 which is about 90% reduction in comparison to her baseline. The outlet daytime in therapy as 5 hours and 46 minutes compliance is 94% for over 4 hours of use this as a dull look for a 90 day period beginning 08-04-13. Pressure is medium at 11.4 cm water, pressure is 13 cm EPR 2 cm water. The patient also filled a restless leg syndrome quality of life study and she states that for the most part her symptoms are mild-to-moderate they do not necessarily qualify to limit  her quality of life.   She is nor does earlier on that of restless leg symptoms about 7 PM and in response we will allow the patient 1.5 mg of Mirapex in the evening she actually may want to take a 0.5 mg tablet around dinnertime and then a 1 mg tablet at night tube  The geriatric depression score was endorsed at the 5 points, fatigue severity scale at 52 points , Epworth  sleepiness score at 19 points.  [facility] is DME - Linzie Collin , RT        Review of Systems: Out of a complete 14 system review, the patient complains of only the following symptoms, and all other reviewed systems are negative. Epworth  19 ,  On CPAP and 2 litres of 02 at night. FSS : 52 points, GDS 6 points.   Fall risk - uses walker 12 points.    History   Social History  . Marital Status: Married    Spouse Name: Tracey Nguyen    Number of Children: 1  . Years of Education: 12   Occupational History  .     Social History Main Topics  . Smoking status: Former Smoker -- 0.20 packs/day for 10 years    Types: Cigarettes    Quit date: 08/09/1964  . Smokeless tobacco: Never Used  . Alcohol Use: No  . Drug Use: No  . Sexual Activity: No   Other Topics Concern  . Not on file   Social History Narrative   patient needs to be seen face to face for oxygen qualification, RR is 16 , min. mallompati 4 and co2 retention from scoliosis overlap with severe OSA - AHI was 87 at baseline. titrated to 13 cm water CPAP, [facility] to follow.   Patient is married Tracey Nguyen) and lives at home with her husband.   Patient has one child.   Patient is retired.   Patient has a high school education.   Patient is right-handed.   Patient drinks very little caffeine.              History reviewed. No pertinent family history.  Past Medical History  Diagnosis Date  . Asthma   . Arthritis   . Cancer 2007    breast cancer  . GERD (gastroesophageal reflux disease)   . Hypertension   . Osteoporosis   . Sleep apnea     on cpap  . Blood  transfusion   . Cataract     BILATERAL -REMOVED VIA SURGERY  . History of D&C   . Pneumonia     hx of  . Headache(784.0)     hx of migraines  . Complication of anesthesia  hard to wake up in 1970  . Alpha thalassemia minor trait   . Scoliosis/kyphoscoliosis   . 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 Oxygen dependent     Past Surgical History  Procedure Laterality Date  . Femur fracture surgery  2011    titanium rod/right  . Mastectomy  2007    right  . Hemorrhoidectomy  1987  . Breast biopsy  1995    left  . Cataract extraction, bilateral  2006  . Dilation and curettage of uterus    . Femur im nail Right 10/30/2012    Procedure: revision right femur INTRAMEDULLARY NAILING;  Surgeon: Mauri Pole, MD;  Location: [facility];  Service: Orthopedics;  Laterality: Right;  . Hip surgery  10/30/12    Current Outpatient Prescriptions  Medication Sig Dispense Refill  . acetaminophen (TYLENOL) 325 MG tablet Take 325-650 mg by mouth. Taking 2 times per day.      . budesonide-formoterol (SYMBICORT) 160-4.5 MCG/ACT inhaler Inhale 2 puffs into the lungs 2 (two) times daily.  1 Inhaler  11  . BYSTOLIC 5 MG tablet TAKE 1 TABLET BY MOUTH EVERY DAY  30 tablet  0  . DULoxetine (CYMBALTA) 30 MG capsule Take 30 mg by mouth daily.       . fluticasone (FLONASE) 50 MCG/ACT nasal spray Place 1-2 sprays into the nose daily.       . furosemide (LASIX) 40 MG tablet Take 1 tablet (40 mg total) by mouth daily before breakfast.  100 tablet  3  . HYDROcodone-homatropine (HYDROMET) 5-1.5 MG/5ML syrup Take 2.5 mLs by mouth every 6 (six) hours as needed for cough.  240 mL  0  . Ipratropium-Albuterol (COMBIVENT) 20-100 MCG/ACT AERS respimat Inhale 1 puff into the lungs 2 (two) times daily as needed.      Marland Kitchen losartan (COZAAR) 50 MG tablet Take 1 tablet (50 mg total) by mouth daily.  90 tablet  3  . methocarbamol (ROBAXIN) 500 MG tablet Take 500 mg by mouth. Taking 1/2 at night.      Marland Kitchen  omeprazole (PRILOSEC) 20 MG capsule TAKE 1 CAPSULE BY MOUTH ONCE DAILY  90 capsule  0  . polyethylene glycol (MIRALAX / GLYCOLAX) packet Take 17 g by mouth as needed.       . pramipexole (MIRAPEX) 1 MG tablet Take 1.5 tablets (1.5 mg total) by mouth every evening.  135 tablet  3  . traMADol (ULTRAM) 50 MG tablet Take 50 mg by mouth every 6 (six) hours as needed.        No current facility-administered medications for this visit.    Allergies as of 11/02/2013 - Review Complete 11/02/2013  Allergen Reaction Noted  . Codeine  05/25/2007    Vitals: BP 122/84  Pulse 84  Resp 18  Ht 4' 9.5" (1.461 m)  Wt 169 lb (76.658 kg)  BMI 35.91 kg/m2 Last Weight:  Wt Readings from Last 1 Encounters:  11/02/13 169 lb (76.658 kg)   Last Height:   Ht Readings from Last 1 Encounters:  11/02/13 4' 9.5" (1.461 m)   Vision Screening:  Physical exam:  General: The patient is awake, alert and appears not in acute distress. The patient is well groomed. Head: Normocephalic, atraumatic. Neck is supple. Mallampati 4, neck circumference: 22 Cardiovascular:  Regular rate and rhythm, without  murmurs or carotid bruit, and without distended neck veins. Respiratory: Lungs are clear to auscultation. Skin:  Without evidence of edema, or rash Trunk: BMI is  elevated and patient has diet instruction. She has severe scoliosis . She has a short , thick neck and un-compliant chestwall with tachy pnoea at rest.  Neurologic exam : The patient is awake and alert, oriented to place and time.  Memory subjective described as intact. There is a normal attention span & concentration ability. Speech is fluent withoutdysarthria, but she has  Dysphonia.  Mood and affect are appropriate.  Cranial nerves: Pupils are equal and briskly reactive to light. Funduscopic exam without  evidence of pallor or edema. Extraocular movements  in vertical and horizontal planes intact and without nystagmus. Visual fields by finger perimetry are  intact. Hearing to finger rub intact.  Facial sensation intact to fine touch. Facial motor strength is symmetric and tongue and uvula move midline.  Motor exam:   Normal tone and normal muscle bulk and symmetric normal strength in all extremities.  Sensory:  Fine touch, pinprick and vibration were tested in all extremities. Proprioception is tested in the upper extremities only. This was  normal.  Coordination: Rapid alternating movements in the fingers/hands is tested and normal. Finger-to-nose maneuver tested and normal without evidence of ataxia, dysmetria or tremor.  Gait and station: Patient walks with walker, gets easily SOB, mainly sits in a wheelchair if she needs to cover a distance of 100 yards or longer outdoors, uses a walker indoors.     Deep tendon reflexes: in the  upper and lower extremities are symmetric at 1 plus . Babinski maneuver response is downgoing.   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 The hypotony as measured increase the AHI well into the 80s during the patient's last sleep study.  Her download showed in 2013 and 2014 residual AHI of 6.9 which is a good success. Today 8.9 .    Per pulse oximetry on CPAP confirmed the  need for oxygen , with a prolonged desaturation time off totally 4 hours 47 minutes and 40 seconds .This was performed on 10/19/2011.  The patient in addition has asthma and central obstruction with the upper airway as well she has a short neck and a restricted upper airway was a Mallampati 4.   Continuous use of CPAP with oxygen is recommended.   The treatment for asthma that  Dr. Joya Gaskins initiated has helped the patient's shortness of breath tremendously.  Plan:  Treatment plan and additional workup will be reviewed under Problem List.   Yearly RV with CPAP and download.   The patient CPAP machine is set at 13 cm  , She requires  2 liters oxygen due to her chest wall abnormality and hypoventilation. This is bled into her machine.   The patient's restless legs were  less well controlled now with anticipation around 7 PM, increased the dose form 1 mg of Mirapex at night to 1.5 mg ,  I refilled 90 day supplies.

## 2013-11-02 NOTE — Assessment & Plan Note (Signed)
She uses it at night and prn in daytime.

## 2013-11-02 NOTE — Patient Instructions (Signed)
Sleep Apnea  Sleep apnea is disorder that affects a person's sleep. A person with sleep apnea has abnormal pauses in their breathing when they sleep. It is hard for them to get a good sleep. This makes a person tired during the day. It also can lead to other physical problems. There are three types of sleep apnea. One type is when breathing stops for a short time because your airway is blocked (obstructive sleep apnea). Another type is when the brain sometimes fails to give the normal signal to breathe to the muscles that control your breathing (central sleep apnea). The third type is a combination of the other two types.  HOME CARE  · Do not sleep on your back. Try to sleep on your side.  · Take all medicine as told by your doctor.  · Avoid alcohol, calming medicines (sedatives), and depressant drugs.  · Try to lose weight if you are overweight. Talk to your doctor about a healthy weight goal.  Your doctor may have you use a device that helps to open your airway. It can help you get the air that you need. It is called a positive airway pressure (PAP) device. There are three types of PAP devices:  · Continuous positive airway pressure (CPAP) device.  · Nasal expiratory positive airway pressure (EPAP) device.  · Bilevel positive airway pressure (BPAP) device.  MAKE SURE YOU:  · Understand these instructions.  · Will watch your condition.  · Will get help right away if you are not doing well or get worse.  Document Released: 05/04/2008 Document Revised: 07/12/2012 Document Reviewed: 11/27/2011  [employer]® Patient Information ©2014 [employer].

## 2013-11-05 ENCOUNTER — Encounter: Payer: Self-pay | Admitting: Critical Care Medicine

## 2013-11-05 ENCOUNTER — Ambulatory Visit (INDEPENDENT_AMBULATORY_CARE_PROVIDER_SITE_OTHER): Payer: Medicare Other | Admitting: Critical Care Medicine

## 2013-11-05 VITALS — BP 126/72 | HR 70 | Ht <= 58 in | Wt 172.0 lb

## 2013-11-05 DIAGNOSIS — J45909 Unspecified asthma, uncomplicated: Secondary | ICD-10-CM

## 2013-11-05 DIAGNOSIS — Z23 Encounter for immunization: Secondary | ICD-10-CM

## 2013-11-05 DIAGNOSIS — J9610 Chronic respiratory failure, unspecified whether with hypoxia or hypercapnia: Secondary | ICD-10-CM | POA: Insufficient documentation

## 2013-11-05 NOTE — Patient Instructions (Addendum)
No change in medications. Return in      4 months We will obtain you a rollator prevnar 13 vaccine given

## 2013-11-05 NOTE — Progress Notes (Signed)
Subjective:    Patient ID: Tracey Nguyen, female    DOB: 1941-02-06, 73 y.o.   MRN: [id number]  HPI   Subjective:    Patient ID: Tracey Nguyen, female    DOB: 20-Dec-1940, 73 y.o.   MRN: [id number]  HPI 73 y.o.F with moderate persistent asthma, allergic rhinitis, OSA, restriction d/t scoliosis  11/05/13 Chief Complaint  Patient presents with  . Follow-up    Pt c/o SOB with exertion.  States her 49 with exertion has helped greatly.  Pt denies congestion, chest tightness.   O2 has helped with exertion. This patient continues to use and benefit from her oxygen therapy and has re qualified at this visit for continued oxygen therapy Now using oxygen daily rest and exertion 2L Not much cough now. No congestion in chest area.   No chest pain as before  On cpap 13cmh20 via nasal mask  Pt denies any significant sore throat, nasal congestion or excess secretions, fever, chills, sweats, unintended weight loss, pleurtic or exertional chest pain, orthopnea PND, or leg swelling Pt denies any increase in rescue therapy over baseline, denies waking up needing it or having any early am or nocturnal exacerbations of coughing/wheezing/or dyspnea. Pt also denies any obvious fluctuation in symptoms with  weather or environmental change or other alleviating or aggravating factors     Objective:   Physical Exam Filed Vitals:   11/05/13 1549  BP: 126/72  Pulse: 70  Height: 4\' 9"  (1.448 m)  Weight: 172 lb (78.019 kg)  SpO2: 96%    Gen: Pleasant, obese, scoliotic, in no distress,  normal affect  ENT: No lesions,  mouth clear,  oropharynx clear, purulent nares Neck: No JVD, no TMG, no carotid bruits, mild stridor intermittently  Lungs: No use of accessory muscles, distant BS, no wheeze   Cardiovascular: RRR, heart sounds normal, no murmur or gallops, no  peripheral edema  Abdomen: soft and NT, no HSM,  BS normal  Musculoskeletal: kyphoscoliosis,  no cyanosis or  clubbing  Neuro: alert, non focal  Skin: Warm, no lesions or rashes       Assessment & Plan:  Moderate persistent asthma with allergic factors Stable airways  Plan Cont inhaled meds Obtain rollator       Review of Systems      Objective:   Physical Exam  Filed Vitals:   11/05/13 1549  BP: 126/72  Pulse: 70  Height: 4\' 9"  (1.448 m)  Weight: 172 lb (78.019 kg)  SpO2: 96%    Gen: Pleasant, obese , in no distress,  normal affect  ENT: No lesions,  mouth clear,  oropharynx clear, no postnasal drip  Neck: No JVD, no TMG, no carotid bruits  Lungs: No use of accessory muscles, no dullness to percussion, clear without rales or rhonchi, moderate scoliosis  Cardiovascular: RRR, heart sounds normal, no murmur or gallops, no peripheral edema  Abdomen: soft and NT, no HSM,  BS normal  Musculoskeletal: No deformities, no cyanosis or clubbing  Neuro: alert, non focal  Skin: Warm, no lesions or rashes  No results found.        Assessment & Plan:   Moderate persistent asthma with allergic factors Stable airways  Plan Cont inhaled meds Obtain rollator    Updated Medication List Outpatient Encounter Prescriptions as of 11/05/2013  Medication Sig  . acetaminophen (TYLENOL) 325 MG tablet Take 325-650 mg by mouth. Taking 2 times per day.  . budesonide-formoterol (SYMBICORT) 160-4.5 MCG/ACT inhaler Inhale 2 puffs into the  lungs 2 (two) times daily.  Marland Kitchen BYSTOLIC 5 MG tablet TAKE 1 TABLET BY MOUTH EVERY DAY  . DULoxetine (CYMBALTA) 30 MG capsule Take 30 mg by mouth daily.   . fluticasone (FLONASE) 50 MCG/ACT nasal spray Place 1-2 sprays into the nose daily.   . furosemide (LASIX) 40 MG tablet Take 1 tablet (40 mg total) by mouth daily before breakfast.  . HYDROcodone-homatropine (HYDROMET) 5-1.5 MG/5ML syrup Take 2.5 mLs by mouth every 6 (six) hours as needed for cough.  . Ipratropium-Albuterol (COMBIVENT) 20-100 MCG/ACT AERS respimat Inhale 1 puff into the lungs 2  (two) times daily as needed.  Marland Kitchen losartan (COZAAR) 50 MG tablet Take 1 tablet (50 mg total) by mouth daily.  . methocarbamol (ROBAXIN) 500 MG tablet Take 500 mg by mouth. Taking 1/2 at night.  Marland Kitchen omeprazole (PRILOSEC) 20 MG capsule TAKE 1 CAPSULE BY MOUTH ONCE DAILY  . polyethylene glycol (MIRALAX / GLYCOLAX) packet Take 17 g by mouth as needed.   . pramipexole (MIRAPEX) 1 MG tablet Take 1.5 tablets (1.5 mg total) by mouth every evening.  . [DISCONTINUED] traMADol (ULTRAM) 50 MG tablet Take 50 mg by mouth every 6 (six) hours as needed.

## 2013-11-06 ENCOUNTER — Encounter: Payer: Self-pay | Admitting: Neurology

## 2013-11-06 NOTE — Assessment & Plan Note (Signed)
Stable airways  Plan Cont inhaled meds Obtain rollator

## 2013-11-20 ENCOUNTER — Other Ambulatory Visit: Payer: Self-pay | Admitting: Critical Care Medicine

## 2013-12-17 ENCOUNTER — Other Ambulatory Visit: Payer: Self-pay | Admitting: Critical Care Medicine

## 2013-12-24 ENCOUNTER — Telehealth: Payer: Self-pay | Admitting: Critical Care Medicine

## 2013-12-24 MED ORDER — HYDROCODONE-HOMATROPINE 5-1.5 MG/5ML PO SYRP: 2.5000 mL | ORAL_SOLUTION | Freq: Four times a day (QID) | ORAL | Status: DC | PRN

## 2013-12-24 NOTE — Telephone Encounter (Signed)
I called spoke with pt. She is requesting a refill on hydromet cough syrup.  Last refilled 07/27/13 #240 ML x0 refills by TP  last OV 11/05/13 told to f/u in July No pending appt Pt is aware PW not in until Wed. She was fine with this bc she still had enough to last until then. Please advise PW thanks

## 2013-12-24 NOTE — Telephone Encounter (Signed)
RX printed and placed in PW look at for signature for pt to pick up Wed afternoon ATC pt and line busy x 3 WCB  And also forward to [place] as an [place]

## 2013-12-24 NOTE — Telephone Encounter (Signed)
i am ok with the refill

## 2013-12-25 NOTE — Telephone Encounter (Signed)
Hydromet rx was signed by Dr. Joya Gaskins. I will place at [facility] office tomorrow morning for pick up.

## 2013-12-25 NOTE — Telephone Encounter (Signed)
Spoke with the pt and notified rx will be ready for pick up tomorrow am  Will forward to CJ to remind her to have PW sign  Thanks

## 2013-12-26 NOTE — Telephone Encounter (Signed)
Hydromet rx at front for pick up.   lmomtcb on home # to inform pt ATC cell # - no option to leave msg.

## 2013-12-27 NOTE — Telephone Encounter (Signed)
Pt advised. Jennifer Castillo, CMA  

## 2014-01-16 ENCOUNTER — Other Ambulatory Visit: Payer: Self-pay | Admitting: Family Medicine

## 2014-01-23 ENCOUNTER — Other Ambulatory Visit: Payer: Self-pay | Admitting: Critical Care Medicine

## 2014-01-24 ENCOUNTER — Other Ambulatory Visit: Payer: Self-pay | Admitting: Family Medicine

## 2014-01-25 ENCOUNTER — Telehealth: Payer: Self-pay | Admitting: Family Medicine

## 2014-01-25 ENCOUNTER — Other Ambulatory Visit: Payer: Self-pay | Admitting: Family Medicine

## 2014-01-25 MED ORDER — NEBIVOLOL HCL 5 MG PO TABS: ORAL_TABLET | ORAL | Status: DC

## 2014-01-25 NOTE — Telephone Encounter (Signed)
rx sent in electronically 

## 2014-01-25 NOTE — Telephone Encounter (Signed)
[id number] - [place], [place] - [place]. AT [place] is requesting re-fill on BYSTOLIC 5 MG tablet

## 2014-01-25 NOTE — Telephone Encounter (Signed)
Bystolic rx sent on May 13 # 30 x 0 with request to please defer future rx to PCP. Called [facility], spoke with Clair Gulling.  He will defer bystolic rx to PCP.   If they have any problems with getting rx approved - will call our office back.

## 2014-02-04 ENCOUNTER — Telehealth: Payer: Self-pay | Admitting: Critical Care Medicine

## 2014-02-04 ENCOUNTER — Ambulatory Visit (INDEPENDENT_AMBULATORY_CARE_PROVIDER_SITE_OTHER)
Admission: RE | Admit: 2014-02-04 | Discharge: 2014-02-04 | Disposition: A | Discharge status: Home / Self Care | Payer: Medicare Other | Source: Ambulatory Visit | Attending: Pulmonary Disease | Admitting: Pulmonary Disease

## 2014-02-04 ENCOUNTER — Ambulatory Visit (INDEPENDENT_AMBULATORY_CARE_PROVIDER_SITE_OTHER): Payer: Medicare Other | Admitting: Pulmonary Disease

## 2014-02-04 ENCOUNTER — Encounter: Payer: Self-pay | Admitting: Pulmonary Disease

## 2014-02-04 VITALS — BP 124/76 | HR 76 | Temp 98.1°F | Ht <= 58 in | Wt 165.4 lb

## 2014-02-04 DIAGNOSIS — J45909 Unspecified asthma, uncomplicated: Secondary | ICD-10-CM

## 2014-02-04 MED ORDER — NYSTATIN 100000 UNIT/ML MT SUSP: 5.0000 mL | Freq: Two times a day (BID) | OROMUCOSAL | Status: DC

## 2014-02-04 MED ORDER — PREDNISONE 10 MG PO TABS: ORAL_TABLET | ORAL | Status: DC

## 2014-02-04 NOTE — Assessment & Plan Note (Signed)
CXR today Nystatin 65ml swish & swallow bid x 10 days Prednisone 10 mg tabs  Take 2 tabs daily with food x 7ds, then 1 tab daily with food x 7ds then STOP Stay on symbicort twice daily Take combivent for rescue

## 2014-02-04 NOTE — Patient Instructions (Signed)
CXR today Nystatin 56ml swish & swallow bid x 10 days Prednisone 10 mg tabs  Take 2 tabs daily with food x 7ds, then 1 tab daily with food x 7ds then STOP Stay on symbicort twice daily Take combivent for rescue

## 2014-02-04 NOTE — Progress Notes (Signed)
   Subjective:    Patient ID: Tracey Nguyen, female    DOB: Jul 12, 1941, 73 y.o.   MRN: [id number]  HPI  73 y.o.F with moderate persistent asthma, allergic rhinitis, OSA, restriction d/t scoliosis  On oxygen daily rest and exertion 2L  On cpap 13cmh20 via nasal mask  02/04/2014  Chief Complaint  Patient presents with  . Acute Visit    PW pt. Pt c/o dry cough, wheezing, chest tx, increased SOB, blowing out blood green scabby phlem from nose x off/on x 1 week. Pt uses 2 liters at bedtime and PRN daytime   C/o cough x 1 wk Rt infrascapular pain , non radiating No fever CXR - no infiltrates  Review of Systems neg for any significant sore throat, dysphagia, itching, sneezing, nasal congestion or excess/ purulent secretions, fever, chills, sweats, unintended wt loss, pleuritic or exertional cp, hempoptysis, orthopnea pnd or change in chronic leg swelling. Also denies presyncope, palpitations, heartburn, abdominal pain, nausea, vomiting, diarrhea or change in bowel or urinary habits, dysuria,hematuria, rash, arthralgias, visual complaints, headache, numbness weakness or ataxia.     Objective:   Physical Exam  Gen. Pleasant, obese, in no distress ENT - no lesions, no post nasal drip, oral thrush Neck: No JVD, no thyromegaly, no carotid bruits Lungs: kyphoscoliotic,no use of accessory muscles, no dullness to percussion, decreased without rales , faint BL rhonchi  Cardiovascular: Rhythm regular, heart sounds  normal, no murmurs or gallops, no peripheral edema Musculoskeletal: No deformities, no cyanosis or clubbing , no tremors       Assessment & Plan:

## 2014-02-04 NOTE — Telephone Encounter (Signed)
Called spoke with patient who reports she is having some increased SOB, dry cough, wheezing and chest tightness x4 days, some pain behind the right ribs.  Pt denies any f/c/s/n/v, hemoptysis, PND, leg swelling.  Pt is requesting ov today - PW not in the office.  Appt scheduled with RA this afternoon @ 3.15pm, pt aware to call back or seek emergency assistance if symptoms worsen prior to ov.  Nothing further needed, will sign off.

## 2014-02-15 ENCOUNTER — Other Ambulatory Visit: Payer: Self-pay | Admitting: Critical Care Medicine

## 2014-02-24 ENCOUNTER — Other Ambulatory Visit: Payer: Self-pay | Admitting: Family Medicine

## 2014-04-05 ENCOUNTER — Other Ambulatory Visit: Payer: Self-pay | Admitting: Critical Care Medicine

## 2014-04-05 NOTE — Telephone Encounter (Signed)
Pt calling b/c she is out of these meds and needs [facility] [address] [facility]ort and hydrocodone.Hillery Hunter

## 2014-04-11 ENCOUNTER — Telehealth: Payer: Self-pay | Admitting: Critical Care Medicine

## 2014-04-11 NOTE — Telephone Encounter (Signed)
Spoke with the pt's spouse  He states that the pt is needing hydromet refilled He states "not really coughing", but c/o minimal congestion  He states that she usually takes 1/2 spoonful at bedtime when she feels congested  I have scheduled her for rov with PW since she is overdue  Appt pending for 04/29/14  Spouse okay with a call back tomorrow  Please advise on refill, thanks!

## 2014-04-12 ENCOUNTER — Other Ambulatory Visit: Payer: Self-pay | Admitting: Family Medicine

## 2014-04-12 NOTE — Telephone Encounter (Signed)
i do not recommend this Can try delsym prn

## 2014-04-12 NOTE — Telephone Encounter (Signed)
Called spoke w/ pt spouse. Aware of recs. Nothing further needed

## 2014-04-13 ENCOUNTER — Other Ambulatory Visit: Payer: Self-pay | Admitting: Family Medicine

## 2014-04-29 ENCOUNTER — Ambulatory Visit: Payer: Medicare Other | Admitting: Critical Care Medicine

## 2014-05-03 ENCOUNTER — Ambulatory Visit (INDEPENDENT_AMBULATORY_CARE_PROVIDER_SITE_OTHER): Payer: Medicare Other | Admitting: Critical Care Medicine

## 2014-05-03 ENCOUNTER — Encounter: Payer: Self-pay | Admitting: Critical Care Medicine

## 2014-05-03 ENCOUNTER — Other Ambulatory Visit: Payer: Self-pay | Admitting: Family Medicine

## 2014-05-03 VITALS — BP 132/76 | HR 119 | Temp 98.7°F | Ht <= 58 in | Wt 168.0 lb

## 2014-05-03 DIAGNOSIS — J45909 Unspecified asthma, uncomplicated: Secondary | ICD-10-CM

## 2014-05-03 DIAGNOSIS — Z23 Encounter for immunization: Secondary | ICD-10-CM

## 2014-05-03 DIAGNOSIS — J9610 Chronic respiratory failure, unspecified whether with hypoxia or hypercapnia: Secondary | ICD-10-CM

## 2014-05-03 NOTE — Assessment & Plan Note (Signed)
Asthmatic bronchitis with previous sinusitis now improved Plan A lighter weight portable concentrator will be obtained Use oxygen 2Liters rest and exertion Flu vaccine was given No change in medications Use Delsym as needed for cough Return 4 months

## 2014-05-03 NOTE — Patient Instructions (Signed)
A lighter weight portable concentrator will be obtained Use oxygen 2Liters rest and exertion Flu vaccine was given No change in medications Use Delsym as needed for cough Return 4 months

## 2014-05-03 NOTE — Progress Notes (Signed)
Subjective:    Patient ID: Tracey Nguyen, female    DOB: 03-Apr-1941, 73 y.o.   MRN: [id number]  HPI 05/03/2014 Chief Complaint  Patient presents with  . Follow-up    o2 sat 89% on RA upon arrival to exam room.  Feels breathing is better with o2. Has some wheezing and cough with clear to white mucus.  No chest tightness/pain.  Overall the patient is improved. She is less short of breath. There is less cough. There is no wheezing. There is no chest pain or chest tightness.    Review of Systems 11 point review of systems taken in detail and is unremarkable except as above    Objective:   Physical Exam  Filed Vitals:   05/03/14 1020 05/03/14 1026  BP:  132/76  Pulse:  119  Temp:  98.7 F (37.1 C)  TempSrc:  Oral  Height:  4\' 9"  (1.448 m)  Weight:  168 lb (76.204 kg)  SpO2: 89% 90%    Gen: Obese, in no distress,  normal affect  ENT: No lesions,  mouth clear,  oropharynx clear, no postnasal drip  Neck: No JVD, no TMG, no carotid bruits  Lungs: No use of accessory muscles, no dullness to percussion, clear without rales or rhonchi  Cardiovascular: RRR, heart sounds normal, no murmur or gallops, no peripheral edema  Abdomen: soft and NT, no HSM,  BS normal  Musculoskeletal: No deformities, no cyanosis or clubbing  Neuro: alert, non focal  Skin: Warm, no lesions or rashes  No results found.       Assessment & Plan:   Moderate persistent asthma with allergic factors Asthmatic bronchitis with previous sinusitis now improved Plan A lighter weight portable concentrator will be obtained Use oxygen 2Liters rest and exertion Flu vaccine was given No change in medications Use Delsym as needed for cough Return 4 months    Updated Medication List Outpatient Encounter Prescriptions as of 05/03/2014  Medication Sig  . acetaminophen (TYLENOL) 325 MG tablet Take 325-650 mg by mouth. Taking once daily  . BYSTOLIC 5 MG tablet TAKE 1 TABLET BY MOUTH EVERY DAY  .  DULoxetine (CYMBALTA) 30 MG capsule Take 30 mg by mouth daily.   . fluticasone (FLONASE) 50 MCG/ACT nasal spray USE 1 SPRAY IN EACH NOSTRIL EVERY DAY  . furosemide (LASIX) 40 MG tablet TAKE 1 TABLET (40 MG TOTAL) BY MOUTH DAILY BEFORE BREAKFAST.  Marland Kitchen Ipratropium-Albuterol (COMBIVENT) 20-100 MCG/ACT AERS respimat Inhale 1 puff into the lungs 2 (two) times daily as needed.  Marland Kitchen losartan (COZAAR) 50 MG tablet TAKE 1 TABLET BY MOUTH EVERY DAY  . methocarbamol (ROBAXIN) 500 MG tablet Take 500 mg by mouth as needed.   . polyethylene glycol (MIRALAX / GLYCOLAX) packet Take 17 g by mouth as needed.   . pramipexole (MIRAPEX) 1 MG tablet Take 1.5 tablets (1.5 mg total) by mouth every evening.  . SYMBICORT 160-4.5 MCG/ACT inhaler INHALE 2 PUFFS INTO THE LUNGS 2 (TWO) TIMES DAILY.  . traMADol (ULTRAM) 50 MG tablet Take 50 mg by mouth every 6 (six) hours as needed.  . [DISCONTINUED] BYSTOLIC 5 MG tablet TAKE 1 TABLET BY MOUTH EVERY DAY  . [DISCONTINUED] fluticasone (FLONASE) 50 MCG/ACT nasal spray Place 1-2 sprays into the nose daily.   . [DISCONTINUED] losartan (COZAAR) 50 MG tablet TAKE 1 TABLET BY MOUTH EVERY DAY  . omeprazole (PRILOSEC) 20 MG capsule TAKE 1 CAPSULE BY MOUTH ONCE DAILY  . [DISCONTINUED] HYDROcodone-homatropine (HYDROMET) 5-1.5 MG/5ML syrup Take 2.5  mLs by mouth every 6 (six) hours as needed for cough.  . [DISCONTINUED] nystatin (MYCOSTATIN) 100000 UNIT/ML suspension Take 5 mLs (500,000 Units total) by mouth 2 (two) times daily.  . [DISCONTINUED] predniSONE (DELTASONE) 10 MG tablet Take 2 tabs daily w/ food x 7 days, 1 tab daily w/ food x 7 days then stop

## 2014-05-13 ENCOUNTER — Telehealth: Payer: Self-pay | Admitting: Critical Care Medicine

## 2014-05-13 NOTE — Telephone Encounter (Signed)
lmomtcb x1 for melissa 

## 2014-05-13 NOTE — Telephone Encounter (Signed)
Melissa returned call [phone]

## 2014-05-13 NOTE — Telephone Encounter (Signed)
Called spoke with Doran from [facility]. Pt currently has simply go POC. It accomodates 2 liters cont flow. They do have a simply go mini but it only has a pulse dose setting if pt is able to tolerate pulse dose. Is this acceptable for pt Dr. Joya Gaskins? thanks

## 2014-05-13 NOTE — Telephone Encounter (Signed)
Yes pulse 2L is ok

## 2014-05-13 NOTE — Telephone Encounter (Signed)
I called made Tracey Nguyen aware of below. They will evaluate pt for simply go mini 2 liters pulse. Nothing further needed

## 2014-05-20 ENCOUNTER — Other Ambulatory Visit: Payer: Self-pay | Admitting: Family Medicine

## 2014-05-31 ENCOUNTER — Telehealth: Payer: Self-pay | Admitting: Family Medicine

## 2014-05-31 NOTE — Telephone Encounter (Signed)
Pt would like to switch to dr Maudie Mercury when dr Vilinda Blanks. Can I sch?

## 2014-05-31 NOTE — Telephone Encounter (Signed)
Ok in designated transfer/new pt medicare slots. Mondays at 11:15

## 2014-06-13 ENCOUNTER — Other Ambulatory Visit: Payer: Self-pay | Admitting: Family Medicine

## 2014-06-18 ENCOUNTER — Encounter: Payer: Self-pay | Admitting: Neurology

## 2014-06-18 ENCOUNTER — Telehealth: Payer: Self-pay | Admitting: Neurology

## 2014-06-18 NOTE — Telephone Encounter (Signed)
Printed and mailed letter with adjusted appointment time on 11/06/14 per Dr. Edwena Felty schedule.

## 2014-07-12 ENCOUNTER — Other Ambulatory Visit: Payer: Self-pay | Admitting: Family Medicine

## 2014-07-15 ENCOUNTER — Other Ambulatory Visit: Payer: Self-pay | Admitting: *Deleted

## 2014-07-15 MED ORDER — LOSARTAN POTASSIUM 50 MG PO TABS
50.0000 mg | ORAL_TABLET | Freq: Every day | ORAL | Status: DC
Start: 2014-07-15 — End: 2015-01-09

## 2014-07-18 NOTE — Telephone Encounter (Signed)
Pt has been sch

## 2014-07-22 ENCOUNTER — Telehealth: Payer: Self-pay | Admitting: Critical Care Medicine

## 2014-07-22 NOTE — Telephone Encounter (Signed)
Called, spoke with pt -  C/o of pain in bilateral sides with movement x several wks.  Over the past wk, c/o increased SOB, cough with white to green mucus, and wheezing.  No chest tightness or f/c/s.  Using combivent respimat with relief.  Offered appt in the morning, but pt requesting later appt.  OV scheduled with MR tomorrow, Dec 15 at 2:15 pm.  Pt confirmed appt.  She is to seek emergency care if needed.  She verbalized understanding of instructions and voiced no further questions or concerns at this time.

## 2014-07-23 ENCOUNTER — Ambulatory Visit (INDEPENDENT_AMBULATORY_CARE_PROVIDER_SITE_OTHER): Payer: Medicare Other | Admitting: Internal Medicine

## 2014-07-23 ENCOUNTER — Encounter: Payer: Self-pay | Admitting: Internal Medicine

## 2014-07-23 VITALS — BP 144/100 | HR 102 | Ht <= 58 in | Wt 166.0 lb

## 2014-07-23 DIAGNOSIS — J45901 Unspecified asthma with (acute) exacerbation: Secondary | ICD-10-CM

## 2014-07-23 DIAGNOSIS — J9610 Chronic respiratory failure, unspecified whether with hypoxia or hypercapnia: Secondary | ICD-10-CM

## 2014-07-23 MED ORDER — PREDNISONE 10 MG PO TABS: ORAL_TABLET | ORAL | Status: DC

## 2014-07-23 MED ORDER — DOXYCYCLINE HYCLATE 100 MG PO TABS: ORAL_TABLET | ORAL | Status: DC

## 2014-07-23 NOTE — Patient Instructions (Addendum)
ICD-9-CM ICD-10-CM   1. Chronic respiratory failure, unspecified whether with hypoxia or hypercapnia 518.83 J96.10   2. Asthma attack 493.92 J45.901    Take prednisone 40 mg daily x 2 days, then 20mg  daily x 2 days, then 10mg  daily x 2 days, then 5mg  daily x 2 days and stop  Take doxycycline 100mg  po twice daily x 5 days; take after meals and avoid sunlight   If worse or not getting better, go to ER or call us [phone] anytime  Followup as scheduled by Dr Joya Gaskins previously

## 2014-07-23 NOTE — Progress Notes (Signed)
Subjective:    Patient ID: Tracey Nguyen, female    DOB: April 11, 1941, 73 y.o.   MRN: [id number]  HPI  ACute OV 07/23/2014  Chief Complaint  Patient presents with  . Acute Visit    Pt c/o increase in SOB, mid back pain when waking in morning, cough with little mucus production with clear and yellow mucus, chest tightness, PND x 2 weeks.    73 year old female accompanied by her husband. She has severe familial kyphoscoliosis, short stager, chronic respiratory failure on oxygen 24 7. There is an associated history of asthma not otherwise specified. She presents with several-day history of cough, chest tightness, wheezing, feeling fatigued, increased sputum production and change in sputum color from clear to yellow/clear. Symptom severity is rated as moderate. It is progressive but there is no associated fever or chills night sweats. She is worried she is having an exacerbation and request prednisone and antibiotics. 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    reports that she quit smoking about 49 years ago. Her smoking use included Cigarettes. She has a 2 pack-year smoking history. She has never used smokeless tobacco.  Past Surgical History  Procedure Laterality Date  . Femur fracture surgery  2011    titanium rod/right  . Mastectomy  2007    right  . Hemorrhoidectomy  1987  . Breast biopsy  1995    left  . Cataract extraction, bilateral  2006  . Dilation and curettage of uterus    . Femur im  nail Right 10/30/2012    Procedure: revision right femur INTRAMEDULLARY NAILING;  Surgeon: Mauri Pole, MD;  Location: [facility];  Service: Orthopedics;  Laterality: Right;  . Hip surgery  10/30/12    Allergies  Allergen Reactions  . Codeine     REACTION: spacey    Immunization History  Administered Date(s) Administered  . H1N1 09/17/2008  . Influenza Split 07/09/2011, 05/15/2012, 06/06/2013  . Influenza Whole 05/25/2007, 05/23/2008, 07/15/2009  . Influenza,inj,Quad PF,36+ Mos 05/03/2014  . Pneumococcal Conjugate-13 11/05/2013  . Pneumococcal Polysaccharide-23 04/23/2005  . Td 08/21/2002  . Tetanus 10/20/2012  . Zoster 10/01/2008    No family history on file.  Current outpatient prescriptions: acetaminophen (TYLENOL) 325 MG tablet, Take 325-650 mg by mouth. Taking once daily, Disp: , Rfl: ;  DULoxetine (CYMBALTA) 30 MG capsule, TAKE 1 CAPSULE BY MOUTH ONCE DAILY, Disp: 90 capsule, Rfl: 0;  fluticasone (FLONASE) 50 MCG/ACT nasal spray, USE 1 SPRAY IN EACH NOSTRIL EVERY DAY, Disp: 16 g, Rfl: 3 furosemide (LASIX) 40 MG tablet, TAKE 1 TABLET (40 MG TOTAL) BY MOUTH DAILY BEFORE BREAKFAST., Disp: 100 tablet, Rfl: 2;  Ipratropium-Albuterol (COMBIVENT) 20-100 MCG/ACT AERS respimat, Inhale 1 puff into the lungs 2 (two) times daily as needed., Disp: , Rfl: ;  losartan (COZAAR) 50 MG tablet, Take 1 tablet (50 mg total) by mouth daily., Disp: 90 tablet, Rfl: 1;  methocarbamol (ROBAXIN) 500 MG tablet, Take 500 mg by mouth as needed. , Disp: , Rfl:  omeprazole (PRILOSEC) 20 MG capsule, TAKE 1 CAPSULE BY MOUTH ONCE DAILY, Disp: 90 capsule, Rfl: 0;  polyethylene glycol (MIRALAX / GLYCOLAX) packet, Take 17 g by mouth as needed. , Disp: , Rfl: ;  pramipexole (MIRAPEX) 1 MG tablet, Take 1.5 tablets (1.5 mg total) by mouth every evening., Disp: 135 tablet, Rfl: 3;  SYMBICORT 160-4.5 MCG/ACT inhaler, INHALE 2 PUFFS INTO THE LUNGS 2 (TWO) TIMES DAILY., Disp: 10.2 g, Rfl: 5 traMADol (ULTRAM) 50 MG tablet, Take  50 mg by mouth every 6 (six) hours as needed., Disp: , Rfl: ;  BYSTOLIC 5 MG tablet, TAKE 1 TABLET BY MOUTH EVERY DAY (Patient not taking: Reported on 07/23/2014), Disp: 30 tablet, Rfl: 0    Review of Systems  Constitutional: Negative for fever and unexpected weight change.  HENT: Positive for congestion and postnasal drip. Negative for dental problem, ear pain, nosebleeds, rhinorrhea, sinus pressure, sneezing, sore throat and trouble swallowing.   Eyes: Negative for redness and itching.  Respiratory: Positive for cough, chest tightness and shortness of breath. Negative for wheezing.   Cardiovascular: Positive for leg swelling. Negative for palpitations.  Gastrointestinal: Negative for nausea and vomiting.  Genitourinary: Negative for dysuria.  Musculoskeletal: Negative for joint swelling.  Skin: Negative for rash.  Neurological: Negative for headaches.  Hematological: Does not bruise/bleed easily.  Psychiatric/Behavioral: Negative for dysphoric mood. The patient is not nervous/anxious.        Objective:   Physical Exam  Constitutional: She is oriented to person, place, and time. She appears well-developed and well-nourished. No distress.  Body mass index is 35.91 kg/(m^2). Short stature   HENT:  Head: Normocephalic and atraumatic.  Right Ear: External ear normal.  Left Ear: External ear normal.  Mouth/Throat: Oropharynx is clear and moist. No oropharyngeal exudate.  Short neck  Eyes: Conjunctivae and EOM are normal. Pupils are equal, round, and reactive to light. Right eye exhibits no discharge. Left eye exhibits no discharge. No scleral icterus.  Neck: Normal range of motion. Neck supple. No JVD present. No tracheal deviation present. No thyromegaly present.  Cardiovascular: Normal rate, regular rhythm, normal heart sounds and intact distal pulses.  Exam reveals no gallop and no friction rub.   No murmur heard. Pulmonary/Chest: Effort normal and breath sounds normal. No  respiratory distress. She has no wheezes. She has no rales. She exhibits no tenderness.  o2 on  Abdominal: Soft. Bowel sounds are normal. She exhibits no distension and no mass. There is no tenderness. There is no rebound and no guarding.  Musculoskeletal: Normal range of motion. She exhibits no edema or tenderness.  Severe kyphoscoliosis Uses walker  Lymphadenopathy:    She has no cervical adenopathy.  Neurological: She is alert and oriented to person, place, and time. She has normal reflexes. No cranial nerve deficit. She exhibits normal muscle tone. Coordination normal.  Skin: Skin is warm and dry. No rash noted. She is not diaphoretic. No erythema. No pallor.  Psychiatric: She has a normal mood and affect. Her behavior is normal. Judgment and thought content normal.  Vitals reviewed.   Filed Vitals:   07/23/14 1439  BP: 144/100  Pulse: 102  Height: 4\' 9"  (1.448 m)  Weight: 166 lb (75.297 kg)  SpO2: 96%         Assessment & Plan:     ICD-9-CM ICD-10-CM   1. Chronic respiratory failure, unspecified whether with hypoxia or hypercapnia 518.83 J96.10   2. Asthma attack 493.92 J45.901   ake prednisone 40 mg daily x 2 days, then 20mg  daily x 2 days, then 10mg  daily x 2 days, then 5mg  daily x  2 days and stop  Take doxycycline 100mg  po twice daily x 5 days; take after meals and avoid sunlight   If worse or not getting better, go to ER or call us [phone] anytime  Followup as scheduled by Dr Joya Gaskins previously    Dr. Brand Males, M.D., [facility].C.P Pulmonary and Critical Care Medicine Staff Physician [facility] Pager: [phone], If no answer or between  15:00h - 7:00h: call [phone]  07/23/2014 3:17 PM

## 2014-08-02 ENCOUNTER — Other Ambulatory Visit: Payer: Self-pay | Admitting: Family Medicine

## 2014-08-14 ENCOUNTER — Telehealth: Payer: Self-pay | Admitting: Internal Medicine

## 2014-08-14 NOTE — Telephone Encounter (Signed)
Pt c/o increased cough with white-yellow-Jaymian Bogart mucus production. Denies Fever. States that O2 helps with her SOB when she wears it.  Pt scheduled for OV with TP 08/15/14 at 11:15.  Nothing further needed.

## 2014-08-15 ENCOUNTER — Ambulatory Visit (INDEPENDENT_AMBULATORY_CARE_PROVIDER_SITE_OTHER): Payer: Medicare Other | Admitting: Adult Health

## 2014-08-15 ENCOUNTER — Encounter: Payer: Self-pay | Admitting: Adult Health

## 2014-08-15 VITALS — BP 100/68 | HR 100 | Temp 98.7°F | Ht <= 58 in | Wt 171.4 lb

## 2014-08-15 DIAGNOSIS — J4541 Moderate persistent asthma with (acute) exacerbation: Secondary | ICD-10-CM

## 2014-08-15 MED ORDER — PREDNISONE 10 MG PO TABS: ORAL_TABLET | ORAL | Status: DC

## 2014-08-15 MED ORDER — AMOXICILLIN-POT CLAVULANATE 875-125 MG PO TABS: 1.0000 | ORAL_TABLET | Freq: Two times a day (BID) | ORAL | Status: DC

## 2014-08-15 NOTE — Assessment & Plan Note (Signed)
Exacerbation with bronchitis   Plan  Augmentin 875mg  Twice daily  For 7 days  Mucinex DM Twice daily  As needed  Cough/congestion .  Fluids and rest  Prednisone taper over week .  Follow up Dr. Joya Gaskins  6-8 weeks and As needed   Please contact office for sooner follow up if symptoms do not improve or worsen or seek emergency care

## 2014-08-15 NOTE — Progress Notes (Signed)
   Subjective:    Patient ID: Tracey Nguyen, female    DOB: 03/25/1941, 74 y.o.   MRN: [id number]  HPI 4   74 y.o.F with moderate persistent asthma, allergic rhinitis, OSA, restriction d/t scoliosis  On oxygen - 2L  On cpap At bedtime    08/15/2014 Acute OV  Complains of increased cough with white/yellow/light green mucus, tightness, some wheezing, increased SOB x2 weeks.   Denies f/c/s, n/v/d, hemoptysis, PND, leg swelling.  Taking delsym for cough with some help.  Cough is worse in the evening.  More thick congestion in am.  More DOE with activity over last week.  Remains on symbicort and combivent.  Was sick 3 weeks ago with similar symptoms, tx w/ prednisone and doxycycline. Got all better until 2 weeks ago.    Review of Systems Constitutional:   No  weight loss, night sweats,  Fevers, chills,  +fatigue, or  lassitude.  HEENT:   No headaches,  Difficulty swallowing,  Tooth/dental problems, or  Sore throat,                No sneezing, itching, ear ache, \ +nasal congestion, post nasal drip,   CV:  No chest pain,  Orthopnea, PND, swelling in lower extremities, anasarca, dizziness, palpitations, syncope.   GI  No heartburn, indigestion, abdominal pain, nausea, vomiting, diarrhea, change in bowel habits, loss of appetite, bloody stools.   Resp: No shortness of breath with exertion or at rest.  No excess mucus, no productive cough,  No non-productive cough,  No coughing up of blood.  No change in color of mucus.  No wheezing.  No chest wall deformity  Skin: no rash or lesions.  GU: no dysuria, change in color of urine, no urgency or frequency.  No flank pain, no hematuria   MS:  No joint pain or swelling.  No decreased range of motion.  No back pain.  Psych:  No change in mood or affect. No depression or anxiety.  No memory loss.         Objective:   Physical Exam GEN: A/Ox3; pleasant , NAD,eldelry in wc  On O2   HEENT:  [place]/AT,  EACs-clear, TMs-wnl, NOSE-clear  drainage  THROAT-clear, no lesions, no postnasal drip or exudate noted.   NECK:  Supple w/ fair ROM; no JVD; normal carotid impulses w/o bruits; no thyromegaly or nodules palpated; no lymphadenopathy.  RESP  Decreased BS  w/o, wheezes/ rales/ or rhonchi.scoliosis noted  no accessory muscle use, no dullness to percussion  CARD:  RRR, no m/r/g  , no peripheral edema, pulses intact, no cyanosis or clubbing.  GI:   Soft & nt; nml bowel sounds; no organomegaly or masses detected.  Musco: Warm bil, no deformities or joint swelling noted.   Neuro: alert, no focal deficits noted.    Skin: Warm, no lesions or rashes         Assessment & Plan:

## 2014-08-15 NOTE — Patient Instructions (Addendum)
Augmentin 875mg  Twice daily  For 7 days  Mucinex DM Twice daily  As needed  Cough/congestion .  Fluids and rest  Prednisone taper over week .  Follow up Dr. Joya Gaskins  6-8 weeks and As needed   Please contact office for sooner follow up if symptoms do not improve or worsen or seek emergency care

## 2014-08-16 ENCOUNTER — Telehealth: Payer: Self-pay | Admitting: Adult Health

## 2014-08-16 MED ORDER — AZITHROMYCIN 250 MG PO TABS: ORAL_TABLET | ORAL | Status: DC

## 2014-08-16 NOTE — Telephone Encounter (Signed)
Per TP: stop the Augmentin and begin a zpak #1 take as directed, probiotic like Align once daily, push fluids, plenty of rest, advance diet as tolerated.  Call the office if symptoms do not improve or they worsen.  Called spoke with patient and discussed the above with her.  Pt stated that she thought that she had had a similar reaction to Augmentin in the past but did not mention this to TP.  Advised pt will add Augmentin to allergy/interance list.  She will pick up Align and advance an bland diet as tolerated (discussed BRAT diet with her).  Pt is aware to drink plenty of fluids and get some rest.  She will call the office if her symptoms do not improve or they worsen.  Med list updated Zpak sent to verified pharmacy  Nothing further needed; will sign off.

## 2014-08-16 NOTE — Telephone Encounter (Signed)
Called and spoke with pt and she stated that she started the abx yesterday and she has been having diarrhea since she started this.  She has been eating yogurt to help with this but it is not helping.  She is requesting recs from TP.  Please advise. Thanks  Allergies  Allergen Reactions  . Codeine     REACTION: spacey    Current Outpatient Prescriptions on File Prior to Visit  Medication Sig Dispense Refill  . acetaminophen (TYLENOL) 325 MG tablet Take 325-650 mg by mouth. Taking once daily    . amoxicillin-clavulanate (AUGMENTIN) 875-125 MG per tablet Take 1 tablet by mouth 2 (two) times daily. 14 tablet 0  . DULoxetine (CYMBALTA) 30 MG capsule TAKE 1 CAPSULE BY MOUTH ONCE DAILY 90 capsule 0  . fluticasone (FLONASE) 50 MCG/ACT nasal spray USE 1 SPRAY IN EACH NOSTRIL EVERY DAY 16 g 3  . furosemide (LASIX) 40 MG tablet TAKE 1 TABLET (40 MG TOTAL) BY MOUTH DAILY BEFORE BREAKFAST. 100 tablet 2  . Ipratropium-Albuterol (COMBIVENT) 20-100 MCG/ACT AERS respimat Inhale 1 puff into the lungs 2 (two) times daily as needed.    Marland Kitchen losartan (COZAAR) 50 MG tablet Take 1 tablet (50 mg total) by mouth daily. 90 tablet 1  . methocarbamol (ROBAXIN) 500 MG tablet Take 500 mg by mouth as needed.     Marland Kitchen omeprazole (PRILOSEC) 20 MG capsule TAKE 1 CAPSULE BY MOUTH ONCE DAILY 90 capsule 0  . polyethylene glycol (MIRALAX / GLYCOLAX) packet Take 17 g by mouth as needed.     . pramipexole (MIRAPEX) 1 MG tablet Take 1.5 tablets (1.5 mg total) by mouth every evening. 135 tablet 3  . predniSONE (DELTASONE) 10 MG tablet 4 tabs for 2 days, then 3 tabs for 2 days, 2 tabs for 2 days, then 1 tab for 2 days, then stop 20 tablet 0  . SYMBICORT 160-4.5 MCG/ACT inhaler INHALE 2 PUFFS INTO THE LUNGS 2 (TWO) TIMES DAILY. 10.2 g 5  . traMADol (ULTRAM) 50 MG tablet Take 50 mg by mouth every 6 (six) hours as needed.     No current facility-administered medications on file prior to visit.

## 2014-09-11 ENCOUNTER — Other Ambulatory Visit: Payer: Self-pay | Admitting: Critical Care Medicine

## 2014-09-11 IMAGING — RF DG FEMUR 2+V*R*
1 series · 5 of 5 positions shown · non-contrast
Comparison: 03/09/2010

CLINICAL DATA: Removal and update of orthopedic hardware

RIGHT FEMUR - 2 VIEW

[Series 1: run · 5 of 5 slices shown]
[im 1/5]
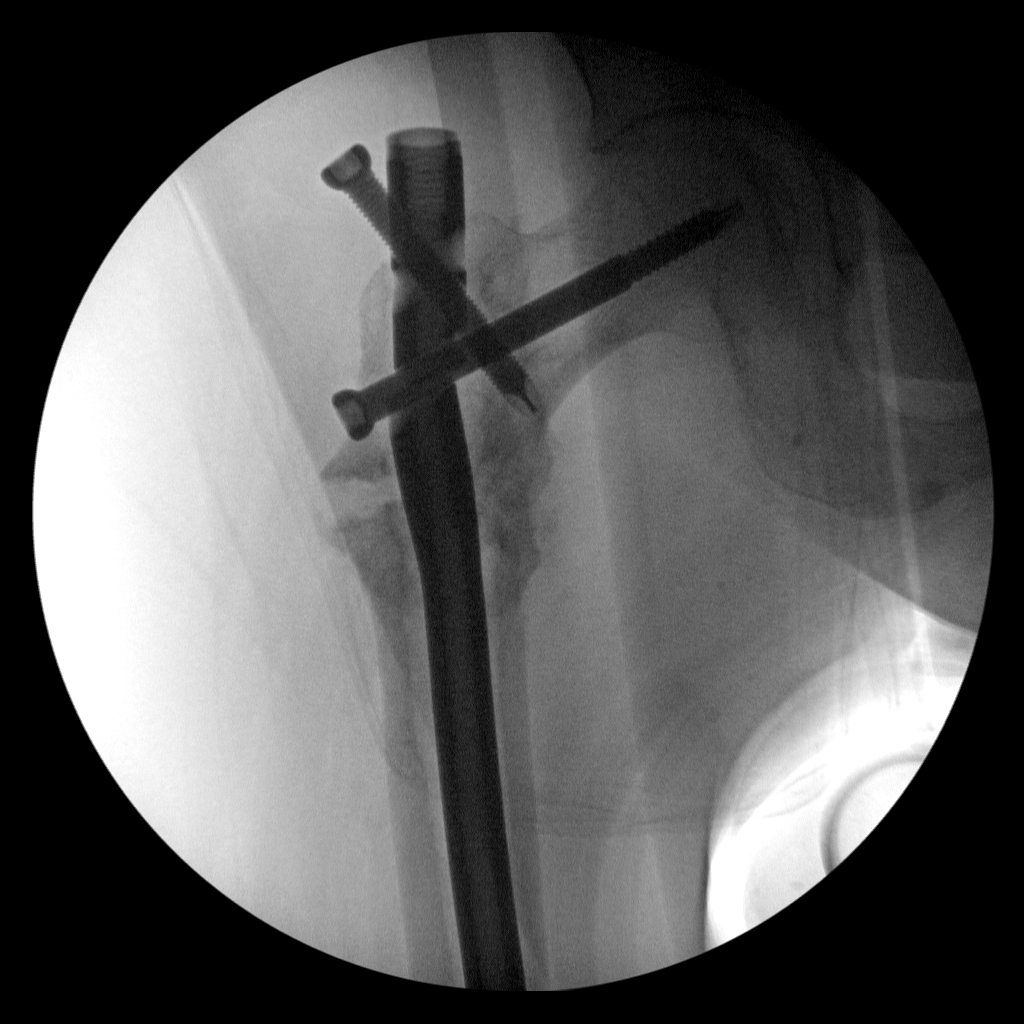
[im 2/5]
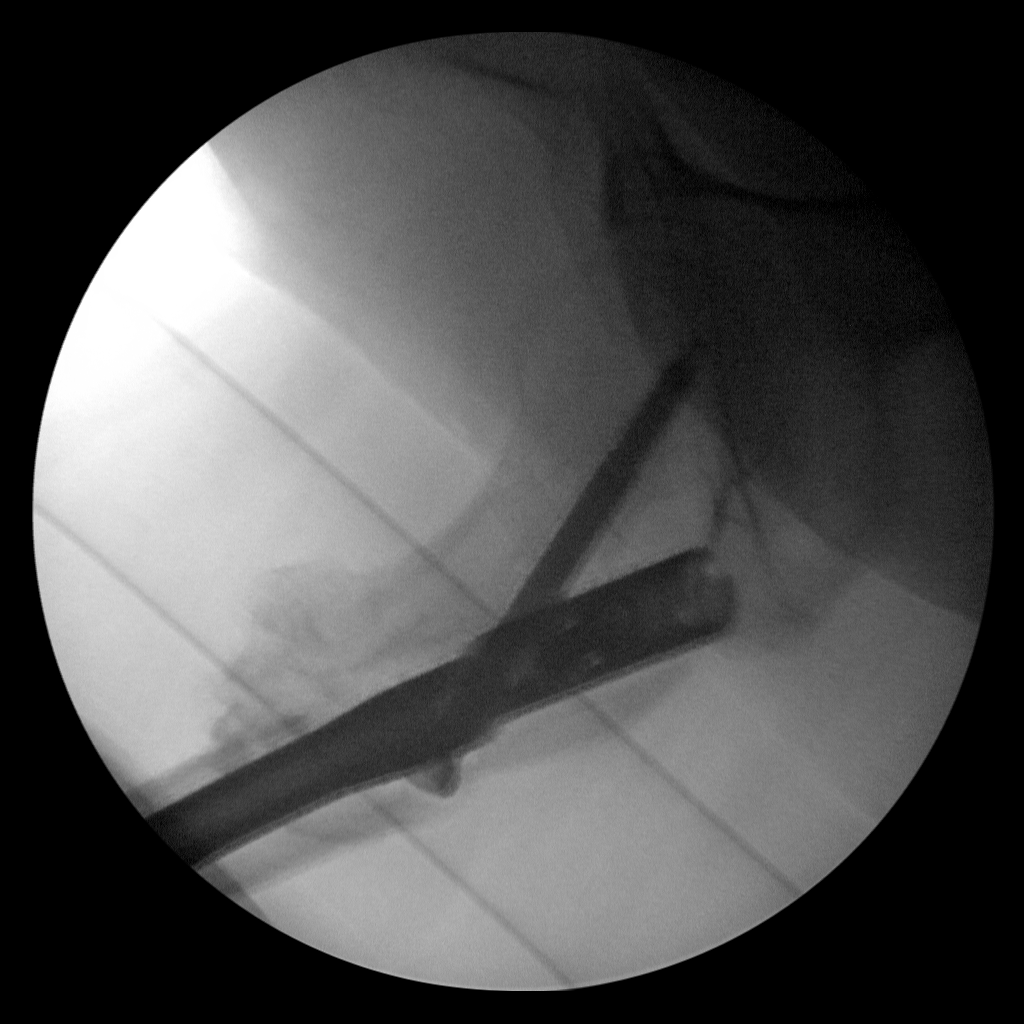
[im 3/5]
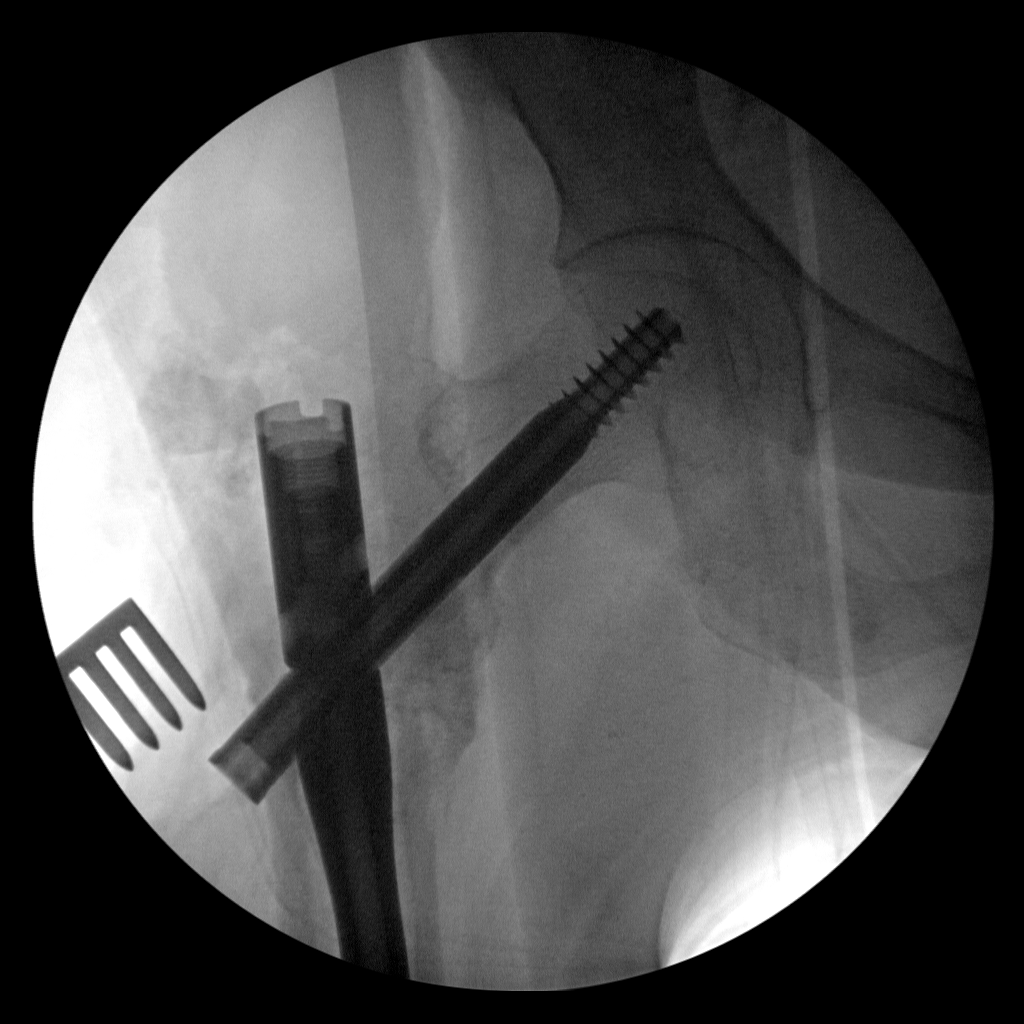
[im 4/5]
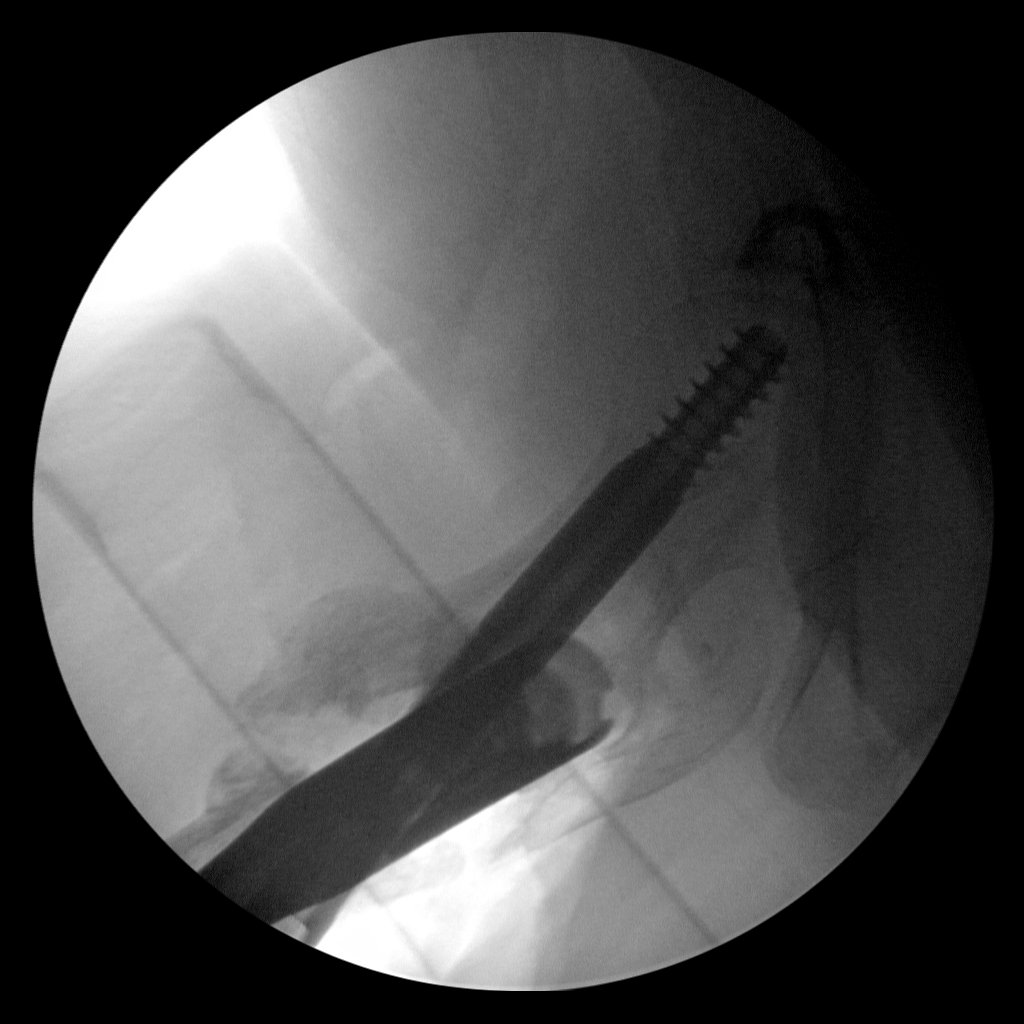
[im 5/5]
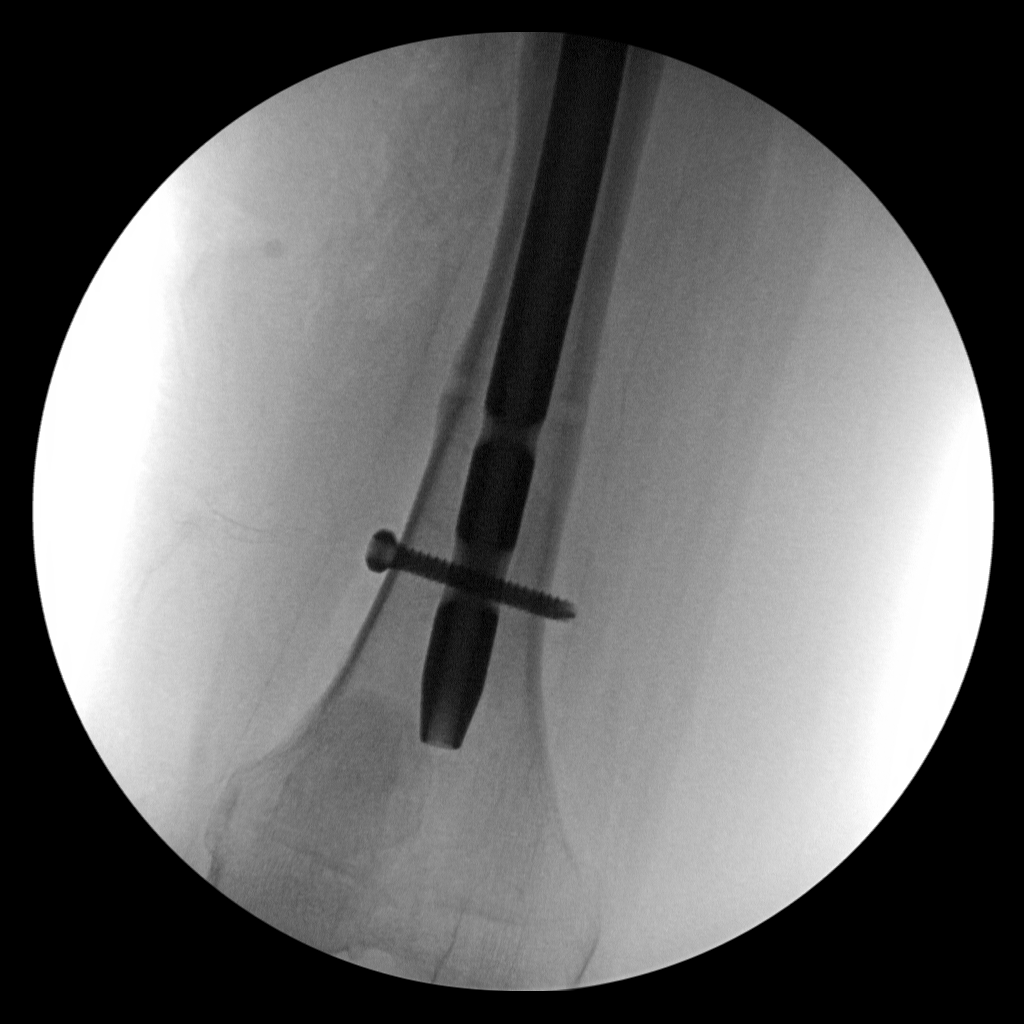

[5 of 5 positions shown; findings below may reference images not displayed]

FINDINGS: Images show placement of a new intramedullary rod and
compression screw extending across the femoral neck into the
femoral head.  The intramedullary rod extends to the distal femoral
metaphysis, held in place by transverse fixation screw.  The
orthopedic hardware appears well seated and there is no evidence of
an operative complication.

## 2014-09-19 ENCOUNTER — Ambulatory Visit (INDEPENDENT_AMBULATORY_CARE_PROVIDER_SITE_OTHER): Payer: Medicare Other | Admitting: Family Medicine

## 2014-09-19 ENCOUNTER — Encounter: Payer: Self-pay | Admitting: Family Medicine

## 2014-09-19 VITALS — BP 118/80 | HR 111 | Temp 97.7°F | Ht <= 58 in | Wt 168.9 lb

## 2014-09-19 DIAGNOSIS — F329 Major depressive disorder, single episode, unspecified: Secondary | ICD-10-CM

## 2014-09-19 DIAGNOSIS — G4733 Obstructive sleep apnea (adult) (pediatric): Secondary | ICD-10-CM

## 2014-09-19 DIAGNOSIS — J9610 Chronic respiratory failure, unspecified whether with hypoxia or hypercapnia: Secondary | ICD-10-CM

## 2014-09-19 DIAGNOSIS — I10 Essential (primary) hypertension: Secondary | ICD-10-CM

## 2014-09-19 DIAGNOSIS — F32A Depression, unspecified: Secondary | ICD-10-CM

## 2014-09-19 DIAGNOSIS — K219 Gastro-esophageal reflux disease without esophagitis: Secondary | ICD-10-CM

## 2014-09-19 DIAGNOSIS — J989 Respiratory disorder, unspecified: Secondary | ICD-10-CM

## 2014-09-19 DIAGNOSIS — G2581 Restless legs syndrome: Secondary | ICD-10-CM

## 2014-09-19 DIAGNOSIS — G4736 Sleep related hypoventilation in conditions classified elsewhere: Secondary | ICD-10-CM

## 2014-09-19 NOTE — Progress Notes (Signed)
HPI:  Tracey Nguyen is here to establish care. Used to see Dr. Sherren Mocha but reports has not seen him in a few years. Last PCP and physical:  Has the following chronic problems that require follow up and concerns today:  HTN, chronic edema: -meds: losartan 50 mg, lasix 40mg  -Denies: CP, palpitations, swelling  Moderate persistent asthma, OSA, restriction due to scoliosis: -managed by her pulmonologist, Dr. 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 Brett Fairy for her sleep issues and RLS - takes miripex/duloxetine for this per her report (denies hx depression but on problem list)  Scoliosis of the back: -uses tylenol for this at times -rarely has back spasms and uses tramadol for bad days - uses a few days per year  Osteoporosis: -s/p tx with boniva in the past after breast ca -not on treatment since  Tremor: -of hands for many years, mild, no progressing  Constipation/GERD: -mild, intermittent -uses mirilax prn rarely and prilosec  -no fiber supplement -denies hx bad reflux, trouble swallowing, hx dilation  Hx Anemia: -alpha thalassemia trait -reports hx eval with onc -had to have transfusion after femur surgery -usually simply monitors otherwise   ROS negative for unless reported above: fevers, unintentional weight loss, hearing or vision loss, chest pain, palpitations, struggling to breath, hemoptysis, melena, hematochezia, hematuria, falls, loc, si, thoughts of self harm  Past Medical History  Diagnosis Date  . Asthma   . Arthritis   . Cancer 2007    breast cancer  . GERD (gastroesophageal reflux disease)   . Hypertension   . Osteoporosis     tx with boniva in the past  . Sleep apnea     on cpap  . Blood transfusion     after femur surgery  . Cataract     BILATERAL -REMOVED VIA SURGERY  . History of D&C   . Pneumonia      hx of  . Headache(784.0)     hx of migraines  . Complication of anesthesia     hard to wake up in 1970  . Alpha thalassemia minor trait   . Scoliosis/kyphoscoliosis   . 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 Oxygen dependent   . S/P removal of and implantation of new IM nail, right hip 10/30/2012  . COLONIC POLYPS, HX OF 08/17/2007    Qualifier: Diagnosis of  By: Sherren Mocha MD, Jory Ee     Past Surgical History  Procedure Laterality Date  . Femur fracture surgery  2011    titanium rod/right  . Mastectomy  2007    right  . Hemorrhoidectomy  1987  . Breast biopsy  1995    left  . Cataract extraction, bilateral  2006  . Dilation and curettage of uterus    . Femur im nail Right 10/30/2012    Procedure: revision right femur INTRAMEDULLARY NAILING;  Surgeon: Mauri Pole, MD;  Location: [facility];  Service: Orthopedics;  Laterality: Right;  . Hip surgery  10/30/12    No family history on file.  History   Social History  . Marital Status: Married    Spouse Name: [name]  . Number of Children: 1  . Years of Education: 12   Occupational History  .     Social History Main Topics  . Smoking status: Former Smoker -- 0.20 packs/day for 10 years    Types: Cigarettes  Quit date: 08/09/1964  . Smokeless tobacco: Never Used  . Alcohol Use: No  . Drug Use: No  . Sexual Activity: No   Other Topics Concern  . None   Social History Narrative   patient needs to be seen face to face for oxygen qualification, RR is 16 , min. mallompati 4 and co2 retention from scoliosis overlap with severe OSA - AHI was 87 at baseline. titrated to 13 cm water CPAP, [facility] to follow.   Patient is married [name]) and lives at home with her husband.   Patient has one child.   Patient is retired.   Patient has a high school education.   Patient is right-handed.   Patient drinks very little caffeine.               Current outpatient prescriptions:  .  acetaminophen (TYLENOL)  325 MG tablet, Take 325-650 mg by mouth. Taking once daily, Disp: , Rfl:  .  COMBIVENT RESPIMAT 20-100 MCG/ACT AERS respimat, INHALE 1 PUFF INTO THE LUNGS EVERY 6 HOURS AS NEEDED FOR WHEEZING OR SHORTNESS OF BREATH, Disp: 1 Inhaler, Rfl: 6 .  DULoxetine (CYMBALTA) 30 MG capsule, TAKE 1 CAPSULE BY MOUTH ONCE DAILY, Disp: 90 capsule, Rfl: 0 .  fluticasone (FLONASE) 50 MCG/ACT nasal spray, USE 1 SPRAY IN EACH NOSTRIL EVERY DAY, Disp: 16 g, Rfl: 3 .  furosemide (LASIX) 40 MG tablet, TAKE 1 TABLET (40 MG TOTAL) BY MOUTH DAILY BEFORE BREAKFAST., Disp: 100 tablet, Rfl: 2 .  Ipratropium-Albuterol (COMBIVENT) 20-100 MCG/ACT AERS respimat, Inhale 1 puff into the lungs 2 (two) times daily as needed., Disp: , Rfl:  .  losartan (COZAAR) 50 MG tablet, Take 1 tablet (50 mg total) by mouth daily., Disp: 90 tablet, Rfl: 1 .  omeprazole (PRILOSEC) 20 MG capsule, TAKE 1 CAPSULE BY MOUTH ONCE DAILY, Disp: 90 capsule, Rfl: 0 .  polyethylene glycol (MIRALAX / GLYCOLAX) packet, Take 17 g by mouth as needed. , Disp: , Rfl:  .  pramipexole (MIRAPEX) 1 MG tablet, Take 1.5 tablets (1.5 mg total) by mouth every evening., Disp: 135 tablet, Rfl: 3 .  SYMBICORT 160-4.5 MCG/ACT inhaler, INHALE 2 PUFFS INTO THE LUNGS 2 (TWO) TIMES DAILY., Disp: 10.2 g, Rfl: 5 .  traMADol (ULTRAM) 50 MG tablet, Take 50 mg by mouth every 6 (six) hours as needed., Disp: , Rfl:   EXAM:  Filed Vitals:   09/19/14 1120  BP: 118/80  Pulse: 111  Temp: 97.7 F (36.5 C)    Body mass index is 35.89 kg/(m^2).  GENERAL: vitals reviewed and listed above, alert, oriented, appears well hydrated and in no acute distress  HEENT: atraumatic, conjunttiva clear, no obvious abnormalities on inspection of external nose and ears  NECK: no obvious masses on inspection  LUNGS: clear to auscultation bilaterally, no wheezes, rales or rhonchi, good air movement  CV: HRRR, no peripheral edema  MS: moves all extremities without noticeable abnormality  PSYCH:  pleasant and cooperative, no obvious depression or anxiety  ASSESSMENT AND PLAN:  Discussed the following assessment and plan:  Depression - pt denies, on problem list - pt report cymbalta is for RLS -she wonders if needs cymbalt, denies hx depression , report rxd for restless leg - advised she discuss with her neurologist   Essential hypertension -well controlled  Gastroesophageal reflux disease, esophagitis presence not specified -advised if not GERD symptoms, no severe symptoms in the past, do trial off  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 -We provided refills for any medications we will prescribe as needed. -We addressed current concerns per orders and patient instructions. -We have asked for records for pertinent exams, studies, vaccines and notes from previous providers. -We have advised patient to follow up per instructions below.   -Patient advised to return or notify a doctor immediately if symptoms worsen or persist or new concerns arise.  Patient Instructions  BEFORE YOU LEAVE: -MEDICARE WELLNESS VISIT in the morning - come fasting but drink plenty water  We recommend the following healthy lifestyle measures: - eat a healthy diet consisting of lots of vegetables, fruits, beans, nuts, seeds, healthy meats such as white chicken and fish and whole grains.  - avoid fried foods, fast food, processed foods, sodas, red meet and other fattening foods.  - get a least 150 minutes of aerobic exercise per week.       [name] R.

## 2014-09-19 NOTE — Patient Instructions (Signed)
BEFORE YOU LEAVE: -MEDICARE WELLNESS VISIT in the morning - come fasting but drink plenty water  We recommend the following healthy lifestyle measures: - eat a healthy diet consisting of lots of vegetables, fruits, beans, nuts, seeds, healthy meats such as white chicken and fish and whole grains.  - avoid fried foods, fast food, processed foods, sodas, red meet and other fattening foods.  - get a least 150 minutes of aerobic exercise per week.

## 2014-09-19 NOTE — Progress Notes (Signed)
Pre visit review using our clinic review tool, if applicable. No additional management support is needed unless otherwise documented below in the visit note. 

## 2014-09-20 ENCOUNTER — Telehealth: Payer: Self-pay | Admitting: Family Medicine

## 2014-09-20 ENCOUNTER — Other Ambulatory Visit: Payer: Self-pay | Admitting: Critical Care Medicine

## 2014-09-20 NOTE — Telephone Encounter (Signed)
emmi emailed °

## 2014-10-02 ENCOUNTER — Ambulatory Visit: Payer: Medicare Other | Admitting: Critical Care Medicine

## 2014-10-03 ENCOUNTER — Other Ambulatory Visit: Payer: Self-pay | Admitting: Family Medicine

## 2014-10-06 ENCOUNTER — Other Ambulatory Visit: Payer: Self-pay | Admitting: Critical Care Medicine

## 2014-10-09 ENCOUNTER — Ambulatory Visit (INDEPENDENT_AMBULATORY_CARE_PROVIDER_SITE_OTHER): Payer: Medicare Other | Admitting: Critical Care Medicine

## 2014-10-09 ENCOUNTER — Encounter: Payer: Self-pay | Admitting: Critical Care Medicine

## 2014-10-09 VITALS — BP 124/82 | HR 95 | Temp 98.1°F | Ht <= 58 in | Wt 168.4 lb

## 2014-10-09 DIAGNOSIS — J9610 Chronic respiratory failure, unspecified whether with hypoxia or hypercapnia: Secondary | ICD-10-CM

## 2014-10-09 DIAGNOSIS — J4540 Moderate persistent asthma, uncomplicated: Secondary | ICD-10-CM

## 2014-10-09 MED ORDER — BUDESONIDE-FORMOTEROL FUMARATE 160-4.5 MCG/ACT IN AERO: 2.0000 | INHALATION_SPRAY | Freq: Two times a day (BID) | RESPIRATORY_TRACT | Status: DC

## 2014-10-09 NOTE — Progress Notes (Signed)
Subjective:    Patient ID: Tracey Nguyen, female    DOB: Jun 02, 1941, 74 y.o.   MRN: [id number]  HPI 4   74 y.o.F with moderate persistent asthma, allergic rhinitis, OSA, restriction d/t scoliosis  On oxygen - 2L  On cpap At bedtime     08/15/14 Acute OV  Complains of increased cough with white/yellow/light green mucus, tightness, some wheezing, increased SOB x2 weeks.   Denies f/c/s, n/v/d, hemoptysis, PND, leg swelling.  Taking delsym for cough with some help.  Cough is worse in the evening.  More thick congestion in am.  More DOE with activity over last week.  Remains on symbicort and combivent.  Was sick 3 weeks ago with similar symptoms, tx w/ prednisone and doxycycline. Got all better until 2 weeks ago.  10/09/2014 Chief Complaint  Patient presents with  . Follow-up    breathing has been fine, having a little trouble today.  Back is hurting where lungs are.    Seen by NP and rx pred and augmentin.   ABX caused upset stomach and diarrhea.  augmentin , then got zpak and did well on this No real cough now.  Dyspnea is at baseline.  Still on oxygen  Pt denies any significant sore throat, nasal congestion or excess secretions, fever, chills, sweats, unintended weight loss, pleurtic or exertional chest pain, orthopnea PND, or leg swelling Pt denies any increase in rescue therapy over baseline, denies waking up needing it or having any early am or nocturnal exacerbations of coughing/wheezing/or dyspnea. Pt also denies any obvious fluctuation in symptoms with  weather or environmental change or other alleviating or aggravating factors  Wt Readings from Last 3 Encounters:  10/09/14 168 lb 6.4 oz (76.386 kg)  09/19/14 168 lb 14.4 oz (76.613 kg)  08/15/14 171 lb 6.4 oz (77.747 kg)        Review of Systems Constitutional:   No  weight loss, night sweats,  Fevers, chills,  +fatigue, or  lassitude.  HEENT:   No headaches,  Difficulty swallowing,  Tooth/dental problems, or   Sore throat,                No sneezing, itching, ear ache, \ +nasal congestion, post nasal drip,   CV:  No chest pain,  Orthopnea, PND, swelling in lower extremities, anasarca, dizziness, palpitations, syncope.   GI  No heartburn, indigestion, abdominal pain, nausea, vomiting, diarrhea, change in bowel habits, loss of appetite, bloody stools.   Resp: No shortness of breath with exertion or at rest.  No excess mucus, no productive cough,  No non-productive cough,  No coughing up of blood.  No change in color of mucus.  No wheezing.  No chest wall deformity  Skin: no rash or lesions.  GU: no dysuria, change in color of urine, no urgency or frequency.  No flank pain, no hematuria   MS:  No joint pain or swelling.  No decreased range of motion.  No back pain.  Psych:  No change in mood or affect. No depression or anxiety.  No memory loss.         Objective:   Physical Exam BP 124/82 mmHg  Pulse 95  Temp(Src) 98.1 F (36.7 C) (Oral)  Ht 4' 9.5" (1.461 m)  Wt 168 lb 6.4 oz (76.386 kg)  BMI 35.79 kg/m2  SpO2 93%   GEN: A/Ox3; pleasant , NAD,eldelry in wc  On O2   HEENT:  [place]/AT,  EACs-clear, TMs-wnl, NOSE-clear drainage  THROAT-clear, no  lesions, no postnasal drip or exudate noted.   NECK:  Supple w/ fair ROM; no JVD; normal carotid impulses w/o bruits; no thyromegaly or nodules palpated; no lymphadenopathy.  RESP  Decreased BS  w/o, wheezes/ rales/ or rhonchi.scoliosis noted  no accessory muscle use, no dullness to percussion  CARD:  RRR, no m/r/g  , no peripheral edema, pulses intact, no cyanosis or clubbing.  GI:   Soft & nt; nml bowel sounds; no organomegaly or masses detected.  Musco: Warm bil, no deformities or joint swelling noted.   Neuro: alert, no focal deficits noted.    Skin: Warm, no lesions or rashes      Assessment & Plan:   Extrinsic asthma Stable extrinsic asthma Plan Cont inhaled meds    Chronic respiratory failure Hypoventilation d/t  obesity, osa, poor drive , kyphoscoliosis Plan Cont oxygen and cpap     Updated Medication List Outpatient Encounter Prescriptions as of 10/09/2014  Medication Sig  . acetaminophen (TYLENOL) 325 MG tablet Take 325-650 mg by mouth. Taking once daily  . budesonide-formoterol (SYMBICORT) 160-4.5 MCG/ACT inhaler Inhale 2 puffs into the lungs 2 (two) times daily.  . COMBIVENT RESPIMAT 20-100 MCG/ACT AERS respimat INHALE 1 PUFF INTO THE LUNGS EVERY 6 HOURS AS NEEDED FOR WHEEZING OR SHORTNESS OF BREATH  . DULoxetine (CYMBALTA) 30 MG capsule TAKE 1 CAPSULE BY MOUTH ONCE DAILY  . fluticasone (FLONASE) 50 MCG/ACT nasal spray USE 1 SPRAY IN EACH NOSTRIL EVERY DAY  . furosemide (LASIX) 40 MG tablet TAKE 1 TABLET (40 MG TOTAL) BY MOUTH DAILY BEFORE BREAKFAST.  Marland Kitchen losartan (COZAAR) 50 MG tablet Take 1 tablet (50 mg total) by mouth daily.  Marland Kitchen omeprazole (PRILOSEC) 20 MG capsule TAKE 1 CAPSULE BY MOUTH ONCE DAILY  . polyethylene glycol (MIRALAX / GLYCOLAX) packet Take 17 g by mouth as needed.   . pramipexole (MIRAPEX) 1 MG tablet Take 1.5 tablets (1.5 mg total) by mouth every evening.  . [DISCONTINUED] SYMBICORT 160-4.5 MCG/ACT inhaler INHALE 2 PUFFS INTO THE LUNGS 2 (TWO) TIMES DAILY.  . [DISCONTINUED] Ipratropium-Albuterol (COMBIVENT) 20-100 MCG/ACT AERS respimat Inhale 1 puff into the lungs 2 (two) times daily as needed.  . [DISCONTINUED] traMADol (ULTRAM) 50 MG tablet Take 50 mg by mouth every 6 (six) hours as needed.

## 2014-10-09 NOTE — Patient Instructions (Signed)
No change in medications. Return in         4 months 

## 2014-10-10 NOTE — Assessment & Plan Note (Signed)
Hypoventilation d/t obesity, osa, poor drive , kyphoscoliosis Plan Cont oxygen and cpap

## 2014-10-10 NOTE — Assessment & Plan Note (Signed)
Stable extrinsic asthma Plan Cont inhaled meds

## 2014-10-28 ENCOUNTER — Other Ambulatory Visit: Payer: Self-pay | Admitting: Family Medicine

## 2014-11-06 ENCOUNTER — Ambulatory Visit (INDEPENDENT_AMBULATORY_CARE_PROVIDER_SITE_OTHER): Payer: Medicare Other | Admitting: Neurology

## 2014-11-06 ENCOUNTER — Ambulatory Visit: Payer: Medicare Other | Admitting: Neurology

## 2014-11-06 ENCOUNTER — Encounter: Payer: Self-pay | Admitting: Neurology

## 2014-11-06 VITALS — BP 125/82 | HR 111 | Ht <= 58 in | Wt 168.8 lb

## 2014-11-06 DIAGNOSIS — J989 Respiratory disorder, unspecified: Secondary | ICD-10-CM | POA: Diagnosis not present

## 2014-11-06 DIAGNOSIS — M4120 Other idiopathic scoliosis, site unspecified: Secondary | ICD-10-CM | POA: Diagnosis not present

## 2014-11-06 DIAGNOSIS — Z9989 Dependence on other enabling machines and devices: Secondary | ICD-10-CM

## 2014-11-06 DIAGNOSIS — G4733 Obstructive sleep apnea (adult) (pediatric): Secondary | ICD-10-CM | POA: Diagnosis not present

## 2014-11-06 DIAGNOSIS — G4736 Sleep related hypoventilation in conditions classified elsewhere: Secondary | ICD-10-CM | POA: Diagnosis not present

## 2014-11-06 NOTE — Progress Notes (Addendum)
[facility]  Provider:  Dr Meshelle Holness Referring Provider: Dorena Cookey, MD Primary Care Physician:  Lucretia Kern., DO  No chief complaint on file.   HPI:  Tracey Nguyen is a 74 y.o. female here as a  follow up from  Dr. Sherren Mocha for  ED Sleepiness , with apnea and oxygen need due to hypoventilation. The patient has severe scoliosis and a non compliant chest wall.   She has been followed in this office since 2006. Last seen 6 month ago by my NP.  The patient , a 74 year old  Caucasian female patient, married,  right-handed ,returns for followup.  She has a history of  obstructive apnea and uses CPAP with nasal pillows at night. She also has a history of restless leg syndrome- she is currently on Cymbalta and Mirapex tolerating both without side effects.   She is using CPAP for many years her last for a sleep study was in 2007 and her last sleep study in 09/09/2011 . At the time the patient had and was the Epworth Sleepiness Scale at 16 points the back inventory at 21 points her BMI was 37.7 her neck circumference measured at 18 inches.  She had to increasing fatigue and sleepiness concerns she also needed to take fluid pills for swelling of the ankle and she needed Combivent for her restrictive breathing disorder. She had increasing dyspnea at the time.  Sleep study AHI was 87.7 her RDI of 92.7 , obstructive apneas in the majority of events , shallow breathing - likely attributed to her noncompliant chest wall.  The patient was titrated to 10 cm water and the residual AHI was 5.3 after CPAP was initiated her borderline tachycardia was reduced to the 80s in normal sinus rhythm. The patient had the CPAP data downloads in 2013,  as well as an overnight pulse oximetry. The pulse oximetry from 10-1211 short persistent low oxygen levels at or below 88% for 4 hours and 47 minutes at night.  CPAP - residual apnea hypopnea index  between 6.9 and 7.7.  Plan : Yearly RV with CPAP  and download.  The patient CPAP machine is set at 13 cm  , She requires  2 liters oxygen due to her chest wall abnormality and hypoventilation. This is bled into her machine. The patient's restless legs were less well controlled now with anticipation around 7 PM, I increased the dose form 1 mg of Mirapex at night to 1.5 mg ,  I refilled 90 day supplies.  C.Efrem Pitstick.    11-06-14, The patient is here today for her routine revisit she is followed by Dr. Sherren Mocha as her primary care physician Dr. Joya Gaskins is her pulmonologist. She will continue to see Dr. Maudie Mercury after Dr. Sherren Mocha will retire. The patient carries a diagnosis of sleep apnea with severe hypoventilation due to scoliosis and chest wall movement restriction. She was positive for CO2 retention. Is using oxygen 2 L at night. She needs it in daytime with exertion, too.  CPAP user , [facility] is DME - Linzie Collin , RT. The treatment for asthma that Dr. Joya Gaskins initiated has helped the patient's shortness of breath tremendously.  Download is obtained :  Baseline respiratory rate 18 while speaking , hoarseness is present.  The patient had a pulse oximetry on CPAP performed by using CPAP which confirmed the need for oxygen this was performed on 10-19-11 and the patient was in status of desaturation or hypoxemia for 4 hours and 47 minutes by being on  CPAP. She has a portable and a 02 standard concentrator, non portable.      Review of Systems: Out of a complete 14 system review, the patient complains of only the following symptoms, and all other reviewed systems are negative. Epworth  9 ,  On CPAP and 2 litres of 02 at night. FSS: 57 points, GDS 6 points. SOB . High Fall risk - uses walker     History   Social History  . Marital Status: Married    Spouse Name: Tracey Nguyen  . Number of Children: 1  . Years of Education: 12   Occupational History  .     Social History Main Topics  . Smoking status: Former Smoker -- 0.20 packs/day for 10 years    Types:  Cigarettes    Quit date: 08/09/1964  . Smokeless tobacco: Never Used  . Alcohol Use: No  . Drug Use: No  . Sexual Activity: No   Other Topics Concern  . Not on file   Social History Narrative   patient needs to be seen face to face for oxygen qualification, RR is 16 , min. mallompati 4 and co2 retention from scoliosis overlap with severe OSA - AHI was 87 at baseline. titrated to 13 cm water CPAP, [facility] to follow.   Patient is married Tracey Nguyen) and lives at home with her husband.   Patient has one child.   Patient is retired.   Patient has a high school education.   Patient is right-handed.   Patient drinks very little caffeine.              No family history on file.  Past Medical History  Diagnosis Date  . Asthma   . Arthritis   . Cancer 2007    breast cancer  . GERD (gastroesophageal reflux disease)   . Hypertension   . Osteoporosis     tx with boniva in the past  . Sleep apnea     on cpap  . Blood transfusion     after femur surgery  . Cataract     BILATERAL -REMOVED VIA SURGERY  . History of D&C   . Pneumonia     hx of  . Headache(784.0)     hx of migraines  . Complication of anesthesia     hard to wake up in 1970  . Alpha thalassemia minor trait   . Scoliosis/kyphoscoliosis   . 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 Oxygen dependent   . S/P removal of and implantation of new IM nail, right hip 10/30/2012  . COLONIC POLYPS, HX OF 08/17/2007    Qualifier: Diagnosis of  By: Sherren Mocha MD, Jory Ee     Past Surgical History  Procedure Laterality Date  . Femur fracture surgery  2011    titanium rod/right  . Mastectomy  2007    right  . Hemorrhoidectomy  1987  . Breast biopsy  1995    left  . Cataract extraction, bilateral  2006  . Dilation and curettage of uterus    . Femur im nail Right 10/30/2012    Procedure: revision right femur INTRAMEDULLARY NAILING;  Surgeon: Mauri Pole, MD;  Location: [facility];  Service: Orthopedics;   Laterality: Right;  . Hip surgery  10/30/12    Current Outpatient Prescriptions  Medication Sig Dispense Refill  . acetaminophen (TYLENOL) 325 MG tablet Take 325-650 mg by mouth. Taking once daily    . budesonide-formoterol (SYMBICORT) 160-4.5 MCG/ACT inhaler Inhale 2  puffs into the lungs 2 (two) times daily. 10.2 Inhaler 11  . COMBIVENT RESPIMAT 20-100 MCG/ACT AERS respimat INHALE 1 PUFF INTO THE LUNGS EVERY 6 HOURS AS NEEDED FOR WHEEZING OR SHORTNESS OF BREATH 1 Inhaler 6  . DULoxetine (CYMBALTA) 30 MG capsule TAKE 1 CAPSULE BY MOUTH ONCE DAILY 90 capsule 0  . fluticasone (FLONASE) 50 MCG/ACT nasal spray USE 1 SPRAY IN EACH NOSTRIL EVERY DAY 16 g 3  . furosemide (LASIX) 40 MG tablet TAKE 1 TABLET (40 MG TOTAL) BY MOUTH DAILY BEFORE BREAKFAST. 100 tablet 1  . losartan (COZAAR) 50 MG tablet Take 1 tablet (50 mg total) by mouth daily. 90 tablet 1  . omeprazole (PRILOSEC) 20 MG capsule TAKE 1 CAPSULE BY MOUTH ONCE DAILY 90 capsule 0  . polyethylene glycol (MIRALAX / GLYCOLAX) packet Take 17 g by mouth as needed.     . pramipexole (MIRAPEX) 1 MG tablet Take 1.5 tablets (1.5 mg total) by mouth every evening. 135 tablet 3   No current facility-administered medications for this visit.    Allergies as of 11/06/2014 - Review Complete 10/09/2014  Allergen Reaction Noted  . Augmentin [amoxicillin-pot clavulanate] Diarrhea 08/16/2014  . Codeine  05/25/2007    Vitals: There were no vitals taken for this visit. Last Weight:  Wt Readings from Last 1 Encounters:  10/09/14 168 lb 6.4 oz (76.386 kg)   Last Height:   Ht Readings from Last 1 Encounters:  10/09/14 4' 9.5" (1.461 m)   Vision Screening:  Physical exam:  General: The patient is awake, alert and appears not in acute distress. The patient is well groomed. Head: Normocephalic, atraumatic.  Neck is supple, but ROM is  Low. . Mallampati 4, neck circumference: 22 inches  Cardiovascular:  Regular rate and rhythm, fast sinus.  without  murmurs or carotid bruit.  Respiratory: Lungs are clear to auscultation. Minimal chest wall movements.   Skin:  Without evidence of edema, or rash Trunk: BMI is elevated and patient has diet instruction. She has severe scoliosis .  She has a short , thick neck and un-compliant chestwall with tachypnoea at rest.  Neurologic exam : The patient is awake and alert, oriented to place and time. Memory subjective described as impaired to naming and word finding.  Delayed name and word finding by 1-2 minutes. There is a normal attention span & concentration ability. Speech is fluent without dysarthria, but she has Dysphonia. Her speech is interrupted by breathing .   Mood and affect are appropriate.  Cranial nerves: Pupils are equal and briskly reactive to light. Funduscopic exam without  evidence of pallor or edema. Extraocular movements  in vertical and horizontal planes intact and without nystagmus. Visual fields by finger perimetry are intact. Hearing to finger rub intact.  Facial sensation intact to fine touch. Facial motor strength is symmetric and tongue and uvula move midline. Motor exam:   Normal tone and normal muscle bulk and symmetric normal strength in all extremities. Lesser grip strength on the right, carpal tunnel?  Sensory:  Fine touch, pinprick and vibration were tested in all extremities. Proprioception is tested in the upper extremities only. This was  normal. Coordination: Rapid alternating movements in the fingers/hands is tested and normal.  Finger-to-nose maneuver tested and normal without evidence of ataxia, dysmetria or tremor. Gait and station: Patient walks with walker, gets easily SOB, mainly sits in a wheelchair if she needs to cover a distance of 100 yards or longer and when  outdoors,  She often uses  a walker indoors.  Deep tendon reflexes: in the  upper and lower extremities are symmetric at 1 plus . Babinski maneuver response is downgoing.   Assessment:  After physical  and neurologic examination, review of laboratory studies, imaging, neurophysiology testing and pre-existing records 1) sleep related hypoventilation syndrome based on chest wall rigidity. The hypoventilation/ hypopneas  as measured increase the AHI well into the 80s during the patient's last sleep study.  Her download showed in 2013 and 2014 residual AHI of 6.9 which is a good success. Awaiting download.  The patient in addition has asthma and central obstruction with the upper airway as well she has a short neck and a restricted upper airway was a Mallampati 4.  Continuous use of CPAP with oxygen is needed. 2) I was able last minutes to obtain a download her residual AHI is 9.5 a pressure setting on CPAP 13 her EPR 2 cm water average daily use a time is 6 hours and 23 minutes the patient has 100% compliance for used days and days over 4 hours of use. AHI is 2 high. I will ask advanced home care to the setting to an hour to titrate her with a pressure between 8 and 16 cm water. I  Will leave  the EPR at 2 cm water. I will ask M.D. also to see if the airleak can be reduced which is rather high air leak.     Plan:  Treatment plan and additional workup will be reviewed under Problem List.  Continue  oxygen and CPAP use.  Mrs. Whitson at describes that she is not feeling short of breath when she uses the oxygen at night she usually doesn't gasp for air or feels air hungry. Her fatigue however remains and I think this is partially due to her high heart rate and her baseline lower oxygen in daytime.  She knows to use the portable oxygen- tank, 02 Needed 24 hours per day.

## 2014-11-06 NOTE — Addendum Note (Signed)
Addended by: Larey Seat on: 11/06/2014 03:51 PM   Modules accepted: Orders

## 2014-11-19 ENCOUNTER — Other Ambulatory Visit: Payer: Self-pay | Admitting: Neurology

## 2014-12-10 ENCOUNTER — Other Ambulatory Visit: Payer: Self-pay

## 2014-12-10 DIAGNOSIS — Z1231 Encounter for screening mammogram for malignant neoplasm of breast: Secondary | ICD-10-CM

## 2014-12-11 ENCOUNTER — Other Ambulatory Visit: Payer: Self-pay | Admitting: Family Medicine

## 2014-12-11 ENCOUNTER — Encounter: Payer: Self-pay | Admitting: Family Medicine

## 2014-12-11 ENCOUNTER — Ambulatory Visit (INDEPENDENT_AMBULATORY_CARE_PROVIDER_SITE_OTHER): Payer: Medicare Other | Admitting: Family Medicine

## 2014-12-11 VITALS — BP 112/80 | HR 97 | Temp 98.1°F | Ht <= 58 in | Wt 166.8 lb

## 2014-12-11 DIAGNOSIS — Z6839 Body mass index (BMI) 39.0-39.9, adult: Secondary | ICD-10-CM

## 2014-12-11 DIAGNOSIS — G4736 Sleep related hypoventilation in conditions classified elsewhere: Secondary | ICD-10-CM

## 2014-12-11 DIAGNOSIS — Z0000 Encounter for general adult medical examination without abnormal findings: Secondary | ICD-10-CM | POA: Diagnosis not present

## 2014-12-11 DIAGNOSIS — G4733 Obstructive sleep apnea (adult) (pediatric): Secondary | ICD-10-CM

## 2014-12-11 DIAGNOSIS — F39 Unspecified mood [affective] disorder: Secondary | ICD-10-CM | POA: Diagnosis not present

## 2014-12-11 DIAGNOSIS — I10 Essential (primary) hypertension: Secondary | ICD-10-CM

## 2014-12-11 DIAGNOSIS — J9610 Chronic respiratory failure, unspecified whether with hypoxia or hypercapnia: Secondary | ICD-10-CM

## 2014-12-11 DIAGNOSIS — E785 Hyperlipidemia, unspecified: Secondary | ICD-10-CM | POA: Diagnosis not present

## 2014-12-11 DIAGNOSIS — J989 Respiratory disorder, unspecified: Secondary | ICD-10-CM

## 2014-12-11 LAB — BASIC METABOLIC PANEL
BUN: 23 mg/dL (ref 6–23)
CHLORIDE: 98 meq/L (ref 96–112)
CO2: 35 mEq/L — ABNORMAL HIGH (ref 19–32)
Calcium: 10.1 mg/dL (ref 8.4–10.5)
Creatinine, Ser: 1.32 mg/dL — ABNORMAL HIGH (ref 0.40–1.20)
GFR: 41.71 mL/min — ABNORMAL LOW (ref >60.00)
GLUCOSE: 88 mg/dL (ref 70–99)
Potassium: 4.2 mEq/L (ref 3.5–5.1)
Sodium: 139 mEq/L (ref 135–145)

## 2014-12-11 LAB — LIPID PANEL
CHOL/HDL RATIO: 4
Cholesterol: 186 mg/dL (ref 0–200)
HDL: 41.4 mg/dL (ref >39.00)
LDL Cholesterol: 106 mg/dL — ABNORMAL HIGH (ref 0–99)
NonHDL: 144.6
Triglycerides: 194 mg/dL — ABNORMAL HIGH (ref 0.0–149.0)
VLDL: 38.8 mg/dL (ref 0.0–40.0)

## 2014-12-11 NOTE — Progress Notes (Signed)
Medicare Annual Preventive Care Visit  (initial annual wellness or annual wellness exam)  Concerns and/or follow up today:  HTN, chronic LE edema,Obesity: -meds: losartan 50 mg, lasix 40mg  -reports she struggles with weight loss, loves certain foods -Denies: CP, palpitations, swelling  Moderate persistent asthma, chronic resp failure, OSA, restriction due to scoliosis: -managed by her pulmonologist, Dr. Joya Gaskins -O2 continuous -cpap at night -symbicort and flonase dialy and combivent prn  Insomnia/RLS, sleep related hypoventilation due to chest wall disorder: -sees Dr. Brett Fairy for her sleep issues, scoliosis and RLS - takes miripex/duloxetine for this per her report (denies hx depression but on problem list) -on CPAP at night  Scoliosis of the back: -uses tylenol for this at times -rarely has back spasms and uses tramadol for bad days - uses a few days per year  Osteoporosis: -s/p tx with boniva in the past after breast ca -not on treatment since  Tremor: -of hands for many years, mild, not progressing  Constipation/GERD: -mild, intermittent -uses mirilax prn rarely and prilosec  -no fiber supplement -denies hx bad reflux, trouble swallowing, hx dilation  Hx Anemia: -alpha thalassemia trait -reports hx eval with onc -had to have transfusion after femur surgery -usually simply monitors otherwise  ROS: negative for report of fevers, unintentional weight loss, vision changes, vision loss, hearing loss or change, chest pain, sob, hemoptysis, melena, hematochezia, hematuria, genital discharge or lesions, falls, bleeding or bruising, loc, thoughts of suicide or self harm, memory loss  1.) Patient-completed health risk assessment  - completed and reviewed, see scanned documentation  2.) Review of Medical History: -PMH, PSH, Family History and current specialty and care providers reviewed and updated and listed below  - see scanned in document in chart and below  Past  Medical History  Diagnosis Date  . Asthma   . Arthritis   . Cancer 2007    breast cancer  . GERD (gastroesophageal reflux disease)   . Hypertension   . Osteoporosis     tx with boniva in the past  . Sleep apnea     on cpap  . Blood transfusion     after femur surgery  . Cataract     BILATERAL -REMOVED VIA SURGERY  . History of D&C   . Pneumonia     hx of  . Headache(784.0)     hx of migraines  . Complication of anesthesia     hard to wake up in 1970  . Alpha thalassemia minor trait   . Scoliosis/kyphoscoliosis   . 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 Oxygen dependent   . S/P removal of and implantation of new IM nail, right hip 10/30/2012  . COLONIC POLYPS, HX OF 08/17/2007    Qualifier: Diagnosis of  By: Sherren Mocha MD, Jory Ee   . Oxygen dependent     Past Surgical History  Procedure Laterality Date  . Femur fracture surgery  2011    titanium rod/right  . Mastectomy  2007    right  . Hemorrhoidectomy  1987  . Breast biopsy  1995    left  . Cataract extraction, bilateral  2006  . Dilation and curettage of uterus    . Femur im nail Right 10/30/2012    Procedure: revision right femur INTRAMEDULLARY NAILING;  Surgeon: Mauri Pole, MD;  Location: [facility];  Service: Orthopedics;  Laterality: Right;  . Hip surgery  10/30/12    History   Social History  . Marital Status: Married  Spouse Name: [name]  . Number of Children: 1  . Years of Education: 12   Occupational History  .     Social History Main Topics  . Smoking status: Former Smoker -- 0.20 packs/day for 10 years    Types: Cigarettes    Quit date: 08/09/1964  . Smokeless tobacco: Never Used  . Alcohol Use: No  . Drug Use: No  . Sexual Activity: No   Other Topics Concern  . Not on file   Social History Narrative   patient needs to be seen face to face for oxygen qualification, RR is 16 , min. mallompati 4 and co2 retention from scoliosis overlap with severe OSA - AHI was 87 at  baseline. titrated to 13 cm water CPAP, [facility] to follow.   Patient is married [name]) and lives at home with her husband.   Patient has one child.   Patient is retired.   Patient has a high school education.   Patient is right-handed.   Patient drinks very little caffeine.              The patient has a family history of  3.) Review of functional ability and level of safety:  Any difficulty hearing?  NO  History of falling? NO  Any trouble with IADLs - using a phone, using transportation, grocery shopping, preparing meals, doing housework, doing laundry, taking medications and managing money? YES - limited by her lung disease  Advance Directives? YES  See summary of recommendations in Patient Instructions below.  4.) Physical Exam Filed Vitals:   12/11/14 0938  BP: 112/80  Pulse: 97  Temp: 98.1 F (36.7 C)   Estimated body mass index is 39.5 kg/(m^2) as calculated from the following:   Height as of this encounter: 4' 6.5" (1.384 m).   Weight as of this encounter: 166 lb 12.8 oz (75.66 kg).  EKG (optional): deferred  General: alert, appear well hydrated and in no acute distress - on oxygen  HEENT: visual acuity grossly intact  CV: HRRR  Lungs: CTA bilaterally  Psych: pleasant and cooperative, no obvious depression or anxiety  Cognitive function grossly intact  See patient instructions for recommendations.  Education and counseling regarding the above review of health provided with a plan for the following: -see scanned patient completed form for further details -fall prevention strategies discussed  -healthy lifestyle discussed -importance and resources for completing advanced directives discussed -see patient instructions below for any other recommendations provided  4)The following written screening schedule of preventive measures were reviewed with assessment and plan made per below, orders and patient instructions:      AAA screening: n/a      Alcohol screening: none     Obesity Screening and counseling: done     STI screening: declined     Tobacco Screening: hx of smoking remotely - light smoker at young age       Pneumococcal (PPSV23 -one dose after 65, one before if risk factors), influenza yearly and hepatitis B vaccines (if high risk - end stage renal disease, IV drugs, homosexual men, live in home for mentally retarded, hemophilia receiving factors) ASSESSMENT/PLAN: done      Screening mammograph (yearly if >40) ASSESSMENT/PLAN:      Colorectal cancer screening (FOBT yearly or flex sig q4y or colonoscopy q10y or barium enema q4y) ASSESSMENT/PLAN: done with these      Diabetes outpatient self-management training services ASSESSMENT/PLAN: done      Bone mass measurements(covered q2y if indicated -  estrogen def, osteoporosis, hyperparathyroid, vertebral abnormalities, osteoporosis or steroids) ASSESSMENT/PLAN: opted not to do      Screening for glaucoma(q1y if high risk - diabetes, FH, AA and > 50 or hispanic and > 65) ASSESSMENT/PLAN: sees opthomologist      Medical nutritional therapy for individuals with diabetes or renal disease ASSESSMENT/PLAN: n/a      Cardiovascular screening blood tests (lipids q5y) ASSESSMENT/PLAN: doing today      Diabetes screening tests ASSESSMENT/PLAN: doing today   7.) Summary: -risk factors and conditions per above assessment were discussed and treatment, recommendations and referrals were offered per documentation above and orders and patient instructions.  Medicare annual wellness visit, initial -see chart and below and above  BMI 39.0-39.9,adult - Plan: Lipid Panel -liefstyle recs advised  Essential hypertension - Plan: Basic metabolic panel -stable  Mood disorder -stable, well controlled -cont current tx  Chronic respiratory failure, unspecified whether with hypoxia or hypercapnia -stable, managed by pulm  Obstructive sleep apnea  Sleep-related hypoventilation  due to chest wall disorder  Hyperlipemia - Plan: Lipid Panel  Patient Instructions  BEFORE YOU LEAVE: -labs -schedule follow up in 6 months    AAA screening: n/a     Alcohol screening: none     Obesity Screening and counseling: done     STI screening: declined     Tobacco Screening: hx of smoking remotely - light smoker at young age       Pneumococcal (PPSV23 -one dose after 22, one before if risk factors), influenza yearly and hepatitis B vaccines (if high risk - end stage renal disease, IV drugs, homosexual men, live in home for mentally retarded, hemophilia receiving factors) ASSESSMENT/PLAN: done      Screening mammograph (yearly if >40) ASSESSMENT/PLAN:      Colorectal cancer screening (FOBT yearly or flex sig q4y or colonoscopy q10y or barium enema q4y) ASSESSMENT/PLAN: done with these      Diabetes outpatient self-management training services ASSESSMENT/PLAN: done      Bone mass measurements(covered q2y if indicated - estrogen def, osteoporosis, hyperparathyroid, vertebral abnormalities, osteoporosis or steroids) ASSESSMENT/PLAN: opted not to do      Screening for glaucoma(q1y if high risk - diabetes, FH, AA and > 50 or hispanic and > 65) ASSESSMENT/PLAN: sees opthomologist      Medical nutritional therapy for individuals with diabetes or renal disease ASSESSMENT/PLAN: n/a      Cardiovascular screening blood tests (lipids q5y) ASSESSMENT/PLAN: doing today      Diabetes screening tests ASSESSMENT/PLAN: doing today

## 2014-12-11 NOTE — Patient Instructions (Signed)
BEFORE YOU LEAVE: -labs -schedule follow up in 6 months    AAA screening: n/a     Alcohol screening: none     Obesity Screening and counseling: done     STI screening: declined     Tobacco Screening: hx of smoking remotely - light smoker at young age       Pneumococcal (PPSV23 -one dose after 44, one before if risk factors), influenza yearly and hepatitis B vaccines (if high risk - end stage renal disease, IV drugs, homosexual men, live in home for mentally retarded, hemophilia receiving factors) ASSESSMENT/PLAN: done      Screening mammograph (yearly if >40) ASSESSMENT/PLAN:      Colorectal cancer screening (FOBT yearly or flex sig q4y or colonoscopy q10y or barium enema q4y) ASSESSMENT/PLAN: done with these      Diabetes outpatient self-management training services ASSESSMENT/PLAN: done      Bone mass measurements(covered q2y if indicated - estrogen def, osteoporosis, hyperparathyroid, vertebral abnormalities, osteoporosis or steroids) ASSESSMENT/PLAN: opted not to do      Screening for glaucoma(q1y if high risk - diabetes, FH, AA and > 50 or hispanic and > 65) ASSESSMENT/PLAN: sees opthomologist      Medical nutritional therapy for individuals with diabetes or renal disease ASSESSMENT/PLAN: n/a      Cardiovascular screening blood tests (lipids q5y) ASSESSMENT/PLAN: doing today      Diabetes screening tests ASSESSMENT/PLAN: doing today

## 2014-12-11 NOTE — Progress Notes (Signed)
Pre visit review using our clinic review tool, if applicable. No additional management support is needed unless otherwise documented below in the visit note. 

## 2014-12-17 ENCOUNTER — Ambulatory Visit
Admission: RE | Admit: 2014-12-17 | Discharge: 2014-12-17 | Disposition: A | Discharge status: Home / Self Care | Payer: Medicare Other | Source: Ambulatory Visit

## 2014-12-17 DIAGNOSIS — Z1231 Encounter for screening mammogram for malignant neoplasm of breast: Secondary | ICD-10-CM

## 2014-12-23 ENCOUNTER — Encounter: Payer: Self-pay | Admitting: Internal Medicine

## 2015-01-09 ENCOUNTER — Other Ambulatory Visit: Payer: Self-pay | Admitting: Family Medicine

## 2015-01-26 ENCOUNTER — Other Ambulatory Visit: Payer: Self-pay | Admitting: Family Medicine

## 2015-03-10 ENCOUNTER — Other Ambulatory Visit: Payer: Self-pay | Admitting: Family Medicine

## 2015-03-17 ENCOUNTER — Ambulatory Visit: Payer: Medicare Other | Admitting: Family Medicine

## 2015-05-14 ENCOUNTER — Telehealth: Payer: Self-pay

## 2015-05-14 NOTE — Telephone Encounter (Signed)
Pt called to make an appt for March of next year. Appt made for 11/06/2015 at 1:30. Pt verbalized understanding to bring her cpap machine to the appt.

## 2015-05-29 ENCOUNTER — Encounter: Payer: Self-pay | Admitting: Pulmonary Disease

## 2015-05-29 ENCOUNTER — Ambulatory Visit (INDEPENDENT_AMBULATORY_CARE_PROVIDER_SITE_OTHER): Payer: Medicare Other | Admitting: Pulmonary Disease

## 2015-05-29 VITALS — BP 108/64 | HR 104 | Temp 98.7°F | Ht <= 58 in | Wt 167.2 lb

## 2015-05-29 DIAGNOSIS — Z23 Encounter for immunization: Secondary | ICD-10-CM

## 2015-05-29 DIAGNOSIS — J4540 Moderate persistent asthma, uncomplicated: Secondary | ICD-10-CM

## 2015-05-29 NOTE — Progress Notes (Signed)
Subjective:    Patient ID: Tracey Nguyen, female    DOB: 09/28/1940, 74 y.o.   MRN: [id number]  HPI 74 y.o.F with moderate persistent asthma, allergic rhinitis, OSA, restriction due to scoliosis. She is a former patient of Dr. Joya Gaskins.   Since her last visit she has been stable. No exacerbations of asthma. She has upper respiratory tract infection for the past 2 weeks but denies any cough, sputum production, fevers, chills. She continues to use 2 L oxygen throughout the day.  Past Medical History  Diagnosis Date  . Asthma   . Arthritis   . Cancer [facility]) 2007    breast cancer  . GERD (gastroesophageal reflux disease)   . Hypertension   . Osteoporosis     tx with boniva in the past  . Sleep apnea     on cpap  . Blood transfusion     after femur surgery  . Cataract     BILATERAL -REMOVED VIA SURGERY  . History of D&C   . Pneumonia     hx of  . Headache(784.0)     hx of migraines  . Complication of anesthesia     hard to wake up in 1970  . Alpha thalassemia minor trait   . Scoliosis/kyphoscoliosis   . 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 Oxygen dependent   . S/P removal of and implantation of new IM nail, right hip 10/30/2012  . COLONIC POLYPS, HX OF 08/17/2007    Qualifier: Diagnosis of  By: Sherren Mocha MD, Jory Ee   . Oxygen dependent      Current outpatient prescriptions:  .  acetaminophen (TYLENOL) 325 MG tablet, Take 325-650 mg by mouth. Taking once daily, Disp: , Rfl:  .  budesonide-formoterol (SYMBICORT) 160-4.5 MCG/ACT inhaler, Inhale 2 puffs into the lungs 2 (two) times daily., Disp: 10.2 Inhaler, Rfl: 11 .  cholecalciferol (VITAMIN D) 1000 UNITS tablet, Take 1,000 Units by mouth daily., Disp: , Rfl:  .  COMBIVENT RESPIMAT 20-100 MCG/ACT AERS respimat, INHALE 1 PUFF INTO THE LUNGS EVERY 6 HOURS AS NEEDED FOR WHEEZING OR SHORTNESS OF BREATH, Disp: 1 Inhaler, Rfl: 6 .  dextromethorphan (DELSYM) 30 MG/5ML liquid, 2 tsp by mouth twice daily as  needed, Disp: , Rfl:  .  DULoxetine (CYMBALTA) 30 MG capsule, TAKE 1 CAPSULE BY MOUTH ONCE DAILY, Disp: 90 capsule, Rfl: 3 .  fluticasone (FLONASE) 50 MCG/ACT nasal spray, USE 1 SPRAY IN EACH NOSTRIL EVERY DAY, Disp: 16 g, Rfl: 3 .  furosemide (LASIX) 40 MG tablet, TAKE 1 TABLET (40 MG TOTAL) BY MOUTH DAILY BEFORE BREAKFAST., Disp: 90 tablet, Rfl: 1 .  losartan (COZAAR) 50 MG tablet, TAKE 1 TABLET (50 MG TOTAL) BY MOUTH DAILY., Disp: 90 tablet, Rfl: 1 .  polyethylene glycol (MIRALAX / GLYCOLAX) packet, Take 17 g by mouth as needed. , Disp: , Rfl:  .  pramipexole (MIRAPEX) 1 MG tablet, TAKE 1 & 1/2 TABLET BY MOUTH ONCE EVERY EVENING, Disp: 135 tablet, Rfl: 3 .  Probiotic Product (ALIGN) 4 MG CAPS, Take 1 capsule by mouth., Disp: , Rfl:   Review of Systems  Denies any cough, feves, chills, sputum production No chest pain, palpitations No nausea, vomiting, diarrhea, constipation. All other review of systems are negative.     Objective:   Physical Exam Blood pressure 108/64, pulse 104, temperature 98.7 F (37.1 C), temperature source Oral, height 4\' 9"  (1.448 m), weight 167 lb 3.2 oz (75.841 kg), SpO2 93 %. Gen.:  Pleasant elderly female. No apparent distress Neuro: No gross focal deficits. Neck: No JVD, lymphadenopathy, thyromegaly. RS: Bibasilar crackles. CVS: S1-S2 heard, no murmurs rubs gallops. Abdomen: Soft, positive bowel sounds. Extremities: No edema.    Assessment & Plan:   #1 Stable extensive asthma.  We will continue the current inhalers.  #2 Hypoventilation due to kyphoscoliosis, restriction, OSA Continue oxygen and CPAP  Marshell Garfinkel MD [place] [facility] Pager [phone] If no answer or after 3pm call: [phone] 05/29/2015, 5:20 PM

## 2015-05-29 NOTE — Patient Instructions (Signed)
You will get a flu vaccination today.  Follow up in 6 months.

## 2015-06-13 ENCOUNTER — Ambulatory Visit: Payer: Medicare Other | Admitting: Family Medicine

## 2015-06-13 ENCOUNTER — Telehealth: Payer: Self-pay | Admitting: Pulmonary Disease

## 2015-06-13 MED ORDER — AZITHROMYCIN 250 MG PO TABS: ORAL_TABLET | ORAL | Status: DC

## 2015-06-13 MED ORDER — PREDNISONE 10 MG PO TABS
ORAL_TABLET | ORAL | Status: DC
Start: 2015-06-13 — End: 2015-07-29

## 2015-06-13 NOTE — Telephone Encounter (Signed)
Patient was here 2 weeks ago, he offered prednisone and she refused prednisone at that time, now she says that she is very tight in her chest, when she blows her nose, it is green or bloody.  She is going out of town to see her sister (who is very sick and dying).  She says that she cannot take Amoxicillin (causes nausea/vomiting).  She says that she would like Zpak and Prednisone called into pharmacy.  Pharmacy: [facility]  Allergies  Allergen Reactions  . Augmentin [Amoxicillin-Pot Clavulanate] Diarrhea  . Codeine     REACTION: spacey   Dr. Vaughan Browner, please advise.

## 2015-06-13 NOTE — Telephone Encounter (Signed)
OK to call in Z pack for 5 days and pred taper. Starting at 40 mg. Reduce by 10 mg every 3 days.

## 2015-06-13 NOTE — Telephone Encounter (Signed)
Patient notified of Dr. Matilde Bash recommendations. Rx sent to pharmacy. Patient notified. Nothing further needed. Closing encounter

## 2015-07-11 ENCOUNTER — Other Ambulatory Visit: Payer: Self-pay | Admitting: Family Medicine

## 2015-07-23 ENCOUNTER — Other Ambulatory Visit: Payer: Self-pay | Admitting: Neurology

## 2015-07-25 ENCOUNTER — Telehealth: Payer: Self-pay | Admitting: Pulmonary Disease

## 2015-07-25 NOTE — Telephone Encounter (Signed)
Yes. Claritin is OK. Ask her to call back if this is not working. She may need abx and steroids.

## 2015-07-25 NOTE — Telephone Encounter (Signed)
Called spoke with pt. She c/o sneezing, runny nose, nasal cong, slight prod cough (clear phlem), blowing out green mucus from nose about once a day, some wheezing, very little chest tx, increase SOB w/ exertion.  Also wants to know if she can take clairitin.  Please advise Dr. Vaughan Browner thanks

## 2015-07-25 NOTE — Telephone Encounter (Signed)
Spoke with pt,aware of recs.  Nothing further needed.  

## 2015-07-29 ENCOUNTER — Telehealth: Payer: Self-pay | Admitting: Pulmonary Disease

## 2015-07-29 ENCOUNTER — Ambulatory Visit (INDEPENDENT_AMBULATORY_CARE_PROVIDER_SITE_OTHER): Payer: Medicare Other | Admitting: Pulmonary Disease

## 2015-07-29 ENCOUNTER — Encounter: Payer: Self-pay | Admitting: Pulmonary Disease

## 2015-07-29 VITALS — BP 124/70 | HR 102 | Ht <= 58 in | Wt 168.8 lb

## 2015-07-29 DIAGNOSIS — J4541 Moderate persistent asthma with (acute) exacerbation: Secondary | ICD-10-CM | POA: Diagnosis not present

## 2015-07-29 MED ORDER — PREDNISONE 10 MG PO TABS: ORAL_TABLET | ORAL | Status: DC

## 2015-07-29 MED ORDER — DOXYCYCLINE HYCLATE 100 MG PO TABS: 100.0000 mg | ORAL_TABLET | Freq: Two times a day (BID) | ORAL | Status: DC

## 2015-07-29 NOTE — Telephone Encounter (Signed)
Patient's husband called, pharmacy never received Prednisone.   Rx list says that Prednisone was called into pharmacy.  Rx was resubmitted and [facility]. Patients husband notified that [facility]. Nothing further needed.

## 2015-07-29 NOTE — Progress Notes (Signed)
Subjective:    Patient ID: Tracey Nguyen, female    DOB: 1940/10/14, 74 y.o.   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  HPI 74 y.o.F with moderate persistent asthma, allergic rhinitis, OSA, restriction due to scoliosis. She is a former patient of Dr. Joya Gaskins.   Since last visit she is developed respiratory tract infection with cough productive of greenish sputum. She sometimes has bloody flecks in it. She has chest congestion with tightness and wheezing and dyspnea. She tried herself or Claritin for a few days but this did not help her symptoms. Denies any fevers or chills. There are no sick contacts.  Past Medical History  Diagnosis Date  . Asthma   . Arthritis   . Cancer [facility]) 2007    breast cancer  . GERD (gastroesophageal reflux disease)   . Hypertension   . Osteoporosis     tx with boniva in the past  . Sleep apnea     on cpap  . Blood transfusion     after femur surgery  . Cataract     BILATERAL -REMOVED VIA SURGERY  . History of D&C   . Pneumonia     hx of  . Headache(784.0)     hx of migraines  . Complication of anesthesia     hard to wake up in 1970  . Alpha thalassemia minor trait   . Scoliosis/kyphoscoliosis   . 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 Oxygen dependent   . S/P removal of and implantation of new IM nail, right hip 10/30/2012  . COLONIC POLYPS, HX OF 08/17/2007    Qualifier: Diagnosis of  By: Sherren Mocha MD, Jory Ee   . Oxygen dependent      Current outpatient prescriptions:  .  acetaminophen (TYLENOL) 325 MG tablet, Take 325-650 mg by mouth. Taking once daily, Disp: , Rfl:  .  budesonide-formoterol (SYMBICORT) 160-4.5 MCG/ACT inhaler, Inhale 2 puffs into the lungs 2 (two) times daily., Disp: 10.2 Inhaler, Rfl: 11 .  cholecalciferol (VITAMIN D) 1000 UNITS tablet, Take 1,000 Units by mouth daily., Disp: , Rfl:  .  COMBIVENT  RESPIMAT 20-100 MCG/ACT AERS respimat, INHALE 1 PUFF INTO THE LUNGS EVERY 6 HOURS AS NEEDED FOR WHEEZING OR SHORTNESS OF BREATH, Disp: 1 Inhaler, Rfl: 6 .  dextromethorphan (DELSYM) 30 MG/5ML liquid, 2 tsp by mouth twice daily as needed, Disp: , Rfl:  .  DULoxetine (CYMBALTA) 30 MG capsule, TAKE 1 CAPSULE BY MOUTH ONCE DAILY, Disp: 90 capsule, Rfl: 3 .  fluticasone (FLONASE) 50 MCG/ACT nasal spray, USE 1 SPRAY IN EACH NOSTRIL EVERY DAY, Disp: 16 g, Rfl: 3 .  furosemide (LASIX) 40 MG tablet, TAKE 1 TABLET (40 MG TOTAL) BY MOUTH DAILY BEFORE BREAKFAST., Disp: 90 tablet, Rfl: 1 .  losartan (COZAAR) 50 MG tablet, TAKE 1 TABLET (50 MG TOTAL) BY MOUTH DAILY., Disp: 90 tablet, Rfl: 0 .  polyethylene glycol (MIRALAX / GLYCOLAX) packet, Take 17 g by mouth as needed. , Disp: , Rfl:  .  pramipexole (MIRAPEX) 1 MG tablet, TAKE 1 & 1/2 TABLETS BY MOUTH ONCE EVERY EVENING, Disp: 135 tablet, Rfl: 1 .  Probiotic Product (ALIGN) 4 MG CAPS, Take 1 capsule by mouth., Disp: , Rfl:   Review of Systems  Cough, chest tightness, dyspnea, wheezing, greenish sputum. Denies any fevers, chills. No chest pain, palpitations No nausea, vomiting, diarrhea, constipation. All other review of systems are negative.  Objective:   Physical Exam Blood pressure 124/70, pulse 102, height 4' 9.5" (1.461 m), weight 168 lb 12.8 oz (76.567 kg), SpO2 92 %.  Gen: Mild distress Neuro: No gross focal deficits. Neck: No JVD, lymphadenopathy, thyromegaly. RS: Bibasilar crackles. No wheeze CVS: S1-S2 heard, no murmurs rubs gallops. Abdomen: Soft, positive bowel sounds. Extremities: No edema.    Assessment & Plan:   #1 Stable extrinsic asthma with exacerbation We will continue the current inhalers. Doxycycline 100 mg twice a day for 7 days Prednisone starting at 40 mg. Reduced by 10 mg every 2 days until complete.  #2 Hypoventilation due to kyphoscoliosis, restriction, OSA Continue oxygen and CPAP  Marshell Garfinkel MD [place]  [facility] Pager [phone] If no answer or after 3pm call: [phone] 07/29/2015, 2:57 PM

## 2015-07-29 NOTE — Addendum Note (Signed)
Addended by: Parke Poisson E on: 07/29/2015 03:14 PM   Modules accepted: Orders

## 2015-07-29 NOTE — Patient Instructions (Addendum)
We will start you on Doxycycline 100 mg twice a day for 7 days and Prednisone starting at 40 mg. Reduce by 10 mg every 2 days until complete.  Return to clinic in 6 months.

## 2015-09-07 ENCOUNTER — Other Ambulatory Visit: Payer: Self-pay | Admitting: Family Medicine

## 2015-09-16 ENCOUNTER — Encounter: Payer: Self-pay | Admitting: Adult Health

## 2015-09-16 ENCOUNTER — Ambulatory Visit (INDEPENDENT_AMBULATORY_CARE_PROVIDER_SITE_OTHER): Payer: Medicare Other | Admitting: Adult Health

## 2015-09-16 ENCOUNTER — Ambulatory Visit (INDEPENDENT_AMBULATORY_CARE_PROVIDER_SITE_OTHER)
Admission: RE | Admit: 2015-09-16 | Discharge: 2015-09-16 | Disposition: A | Discharge status: Home / Self Care | Payer: Medicare Other | Source: Ambulatory Visit | Attending: Adult Health | Admitting: Adult Health

## 2015-09-16 VITALS — BP 126/84 | HR 91 | Temp 98.9°F | Ht <= 58 in | Wt 174.0 lb

## 2015-09-16 DIAGNOSIS — R05 Cough: Secondary | ICD-10-CM

## 2015-09-16 DIAGNOSIS — R059 Cough, unspecified: Secondary | ICD-10-CM

## 2015-09-16 DIAGNOSIS — M7989 Other specified soft tissue disorders: Secondary | ICD-10-CM

## 2015-09-16 MED ORDER — AZITHROMYCIN 250 MG PO TABS
ORAL_TABLET | ORAL | Status: AC
Start: 2015-09-16 — End: 2015-09-21

## 2015-09-16 MED ORDER — PREDNISONE 10 MG PO TABS: ORAL_TABLET | ORAL | Status: DC

## 2015-09-16 NOTE — Assessment & Plan Note (Signed)
R>L leg swelling ? Etiology , may be venous insufficiency  Will check venous doppler

## 2015-09-16 NOTE — Assessment & Plan Note (Signed)
Recurent flare with bronchitis  Check cxr   Plan  Zpack take as directed.  Prednisone taper over next week.  Chest xray today  Venous doppler of legs.  Mucinex  Twice daily  As needed  Cough/congestion  Please contact office for sooner follow up if symptoms do not improve or worsen or seek emergency care  follow up Dr. Vaughan Browner in April as planned and As needed

## 2015-09-16 NOTE — Patient Instructions (Addendum)
Zpack take as directed.  Prednisone taper over next week.  Chest xray today  Venous doppler of legs.  Mucinex  Twice daily  As needed  Cough/congestion  Please contact office for sooner follow up if symptoms do not improve or worsen or seek emergency care  follow up Dr. Vaughan Browner in April as planned and As needed

## 2015-09-16 NOTE — Progress Notes (Signed)
   Subjective:    Patient ID: Tracey Nguyen, female    DOB: 06-23-1941, 75 y.o.   MRN: [id number]  HPI  75 y.o.F former smoker  with moderate persistent asthma, allergic rhinitis, OSA, restriction d/t scoliosis  On oxygen - 2L  On cpap At bedtime    09/16/2015 Acute OV  Pt presents for an acute office visit.  Complains of  2 week of productive cough, congestion w/ green mucus , wheezing , drainage and SOB. Denies f/c/s, n/v/d, hemoptysis, PND, leg swelling.  Taking delsym for cough with some help.  Complains of leg swelling , worse on right for couple of weeks. No chest pain.  Remains on symbicort and combivent.  Had bronchitis in Dec, tx w/ Doxycycline and Prednisone . Says she got better.      Review of Systems Constitutional:   No  weight loss, night sweats,  Fevers, chills,  +fatigue, or  lassitude.  HEENT:   No headaches,  Difficulty swallowing,  Tooth/dental problems, or  Sore throat,                No sneezing, itching, ear ache, nasal congestion, post nasal drip,   CV:  No chest pain,  Orthopnea, PND, swelling in lower extremities, anasarca, dizziness, palpitations, syncope.   GI  No heartburn, indigestion, abdominal pain, nausea, vomiting, diarrhea, change in bowel habits, loss of appetite, bloody stools.   Resp:    No chest wall deformity  Skin: no rash or lesions.  GU: no dysuria, change in color of urine, no urgency or frequency.  No flank pain, no hematuria   MS:  No joint pain or swelling.  No decreased range of motion.  No back pain.  Psych:  No change in mood or affect. No depression or anxiety.  No memory loss.         Objective:   Physical Exam   Filed Vitals:   09/16/15 1632  BP: 126/84  Pulse: 91  Temp: 98.9 F (37.2 C)  TempSrc: Oral  Height: 4\' 9"  ([id number] m)  Weight: 174 lb (78.926 kg)  SpO2: 93%    GEN: A/Ox3; pleasant , NAD,eldelry in wc  On O2   HEENT:  [place]/AT,  EACs-clear, TMs-wnl, NOSE-clear drainage  THROAT-clear, no  lesions, no postnasal drip or exudate noted.   NECK:  Supple w/ fair ROM; no JVD; normal carotid impulses w/o bruits; no thyromegaly or nodules palpated; no lymphadenopathy.  RESP  Decreased BS  w/o, wheezes/ rales/ or rhonchi.scoliosis noted  no accessory muscle use, no dullness to percussion  CARD:  RRR, no m/r/g  , tr-1+ R>L  peripheral edema, neg calf pain, , pulses intact, no cyanosis or clubbing.  GI:   Soft & nt; nml bowel sounds; no organomegaly or masses detected.  Musco: Warm bil, no deformities or joint swelling noted.   Neuro: alert, no focal deficits noted.    Skin: Warm, no lesions or rashes         Assessment & Plan:

## 2015-09-16 NOTE — Assessment & Plan Note (Signed)
Cont on O2 .  

## 2015-09-18 ENCOUNTER — Ambulatory Visit (HOSPITAL_COMMUNITY)
Admission: RE | Admit: 2015-09-18 | Discharge: 2015-09-18 | Disposition: A | Discharge status: Home / Self Care | Payer: Medicare Other | Source: Ambulatory Visit | Attending: Adult Health | Admitting: Adult Health

## 2015-09-18 DIAGNOSIS — M7989 Other specified soft tissue disorders: Secondary | ICD-10-CM | POA: Diagnosis not present

## 2015-09-18 DIAGNOSIS — I10 Essential (primary) hypertension: Secondary | ICD-10-CM | POA: Diagnosis not present

## 2015-09-18 DIAGNOSIS — J449 Chronic obstructive pulmonary disease, unspecified: Secondary | ICD-10-CM | POA: Diagnosis not present

## 2015-09-19 ENCOUNTER — Telehealth: Payer: Self-pay | Admitting: Pulmonary Disease

## 2015-09-19 NOTE — Telephone Encounter (Signed)
Called spoke with spouse and is aware of results. He had no questions and nothing further needed

## 2015-09-19 NOTE — Telephone Encounter (Signed)
CXR was okay with no sign of PNA  I do not have doppler results yet  Please contact office for sooner follow up if symptoms do not improve or worsen or seek emergency care

## 2015-09-19 NOTE — Progress Notes (Signed)
Quick Note:  Pt is aware of results and recs. Results were given by Mindy. See phone note from 09/19/15 for further details. ______

## 2015-09-19 NOTE — Telephone Encounter (Signed)
Pt had CXR on 09/17/15 and venous doppler on 2/9. Pt spouse is requesting results. Please advise TP thanks

## 2015-09-26 NOTE — Progress Notes (Signed)
Quick Note:  Called and spoke with the pt. Reviewed results and recs. Pt voiced understanding and had no further questions. ______ 

## 2015-10-01 ENCOUNTER — Telehealth: Payer: Self-pay | Admitting: Pulmonary Disease

## 2015-10-01 NOTE — Telephone Encounter (Signed)
Pt states that she is still coughing and having a hard time sleeping. States that the cough is constant.  Would like to know if there is any type of suppression that can recommended. Completed the zpak and prednisone - states that she noticed slight improvement - mucus is lighter green now, no loner dark green.  Pt c/o SOB and chest tightness/soreness from coughing.  Denies fever.    Patient Instructions 09/16/15     Zpack take as directed.  Prednisone taper over next week.  Chest xray today  Venous doppler of legs.  Mucinex Twice daily As needed Cough/congestion  Please contact office for sooner follow up if symptoms do not improve or worsen or seek emergency care  follow up Dr. Vaughan Browner in April as planned and As needed    Please advise Dr Chase Caller as Dr Vaughan Browner is not available. Thanks.

## 2015-10-01 NOTE — Telephone Encounter (Signed)
Pt scheduled with Dr Lamonte Sakai at 3:30 for acute visit per MR 10/02/15 Nothing further needed.

## 2015-10-01 NOTE — Telephone Encounter (Signed)
Based on review of the chart I cannot figure out if there is ongoing asthma exacerbation or she is just having residual post asthma exacerbation cough that is just wanted take a long time to settle down. This is best sorted out in the office with an acute visit tomorrow. If she is not happy with this then we can do another prednisone taper as follows  Please take prednisone 40 mg x1 day, then 30 mg x1 day, then 20 mg x1 day, then 10 mg x1 day, and then 5 mg x1 day and stop  She can also try Tessalon cough perles 200 mg 3 times a day for 1 week  General experience with cough syrups as bad

## 2015-10-02 ENCOUNTER — Ambulatory Visit (INDEPENDENT_AMBULATORY_CARE_PROVIDER_SITE_OTHER): Payer: Medicare Other | Admitting: Adult Health

## 2015-10-02 ENCOUNTER — Other Ambulatory Visit (INDEPENDENT_AMBULATORY_CARE_PROVIDER_SITE_OTHER): Payer: Medicare Other

## 2015-10-02 ENCOUNTER — Ambulatory Visit: Payer: Medicare Other | Admitting: Emergency Medicine

## 2015-10-02 ENCOUNTER — Encounter: Payer: Self-pay | Admitting: Adult Health

## 2015-10-02 VITALS — BP 118/82 | HR 101 | Temp 98.8°F | Ht <= 58 in | Wt 172.0 lb

## 2015-10-02 DIAGNOSIS — R0602 Shortness of breath: Secondary | ICD-10-CM

## 2015-10-02 DIAGNOSIS — J4541 Moderate persistent asthma with (acute) exacerbation: Secondary | ICD-10-CM | POA: Diagnosis not present

## 2015-10-02 DIAGNOSIS — J9611 Chronic respiratory failure with hypoxia: Secondary | ICD-10-CM | POA: Diagnosis not present

## 2015-10-02 DIAGNOSIS — J45901 Unspecified asthma with (acute) exacerbation: Secondary | ICD-10-CM

## 2015-10-02 LAB — CBC WITH DIFFERENTIAL/PLATELET
BASOS ABS: 0 10*3/uL (ref 0.0–0.1)
Basophils Relative: 0.1 % (ref 0.0–3.0)
EOS ABS: 0.5 10*3/uL (ref 0.0–0.7)
Eosinophils Relative: 4.3 % (ref 0.0–5.0)
HEMATOCRIT: 41.5 % (ref 36.0–46.0)
HEMOGLOBIN: 12.4 g/dL (ref 12.0–15.0)
LYMPHS PCT: 11.9 % — AB (ref 12.0–46.0)
Lymphs Abs: 1.5 10*3/uL (ref 0.7–4.0)
MCHC: 29.8 g/dL — ABNORMAL LOW (ref 30.0–36.0)
MCV: 66.1 fl — ABNORMAL LOW (ref 78.0–100.0)
MONO ABS: 0.9 10*3/uL (ref 0.1–1.0)
Monocytes Relative: 7.1 % (ref 3.0–12.0)
Neutro Abs: 9.8 10*3/uL — ABNORMAL HIGH (ref 1.4–7.7)
Neutrophils Relative %: 76.6 % (ref 43.0–77.0)
PLATELETS: 222 10*3/uL (ref 150.0–400.0)
RBC: 6.27 Mil/uL — ABNORMAL HIGH (ref 3.87–5.11)
RDW: 15.2 % (ref 11.5–15.5)
WBC: 12.8 10*3/uL — AB (ref 4.0–10.5)

## 2015-10-02 LAB — BASIC METABOLIC PANEL
BUN: 19 mg/dL (ref 6–23)
CO2: 40 mEq/L — ABNORMAL HIGH (ref 19–32)
Calcium: 9.8 mg/dL (ref 8.4–10.5)
Chloride: 99 mEq/L (ref 96–112)
Creatinine, Ser: 1.26 mg/dL — ABNORMAL HIGH (ref 0.40–1.20)
GFR: 43.91 mL/min — AB (ref >60.00)
GLUCOSE: 106 mg/dL — AB (ref 70–99)
POTASSIUM: 3.9 meq/L (ref 3.5–5.1)
SODIUM: 143 meq/L (ref 135–145)

## 2015-10-02 LAB — BRAIN NATRIURETIC PEPTIDE: PRO B NATRI PEPTIDE: 25 pg/mL (ref 0.0–100.0)

## 2015-10-02 MED ORDER — LEVALBUTEROL HCL 0.63 MG/3ML IN NEBU
0.6300 mg | INHALATION_SOLUTION | Freq: Once | RESPIRATORY_TRACT | Status: AC
  Administered 2015-10-02: 0.63 mg via RESPIRATORY_TRACT

## 2015-10-02 MED ORDER — HYDROCODONE-HOMATROPINE 5-1.5 MG/5ML PO SYRP: ORAL_SOLUTION | ORAL | Status: DC

## 2015-10-02 NOTE — Assessment & Plan Note (Signed)
Cont on O2 .  

## 2015-10-02 NOTE — Patient Instructions (Addendum)
Prednisone taper over next week.  Mucinex  DM Twice daily  As needed  Cough/congestion  Hydromet 1 tsp every 6hr as needed for cough , may make you sleepy.  Labs today .  Please contact office for sooner follow up if symptoms do not improve or worsen or seek emergency care  follow up Dr. Vaughan Browner in April as planned and As needed

## 2015-10-02 NOTE — Assessment & Plan Note (Signed)
Slow to resolve flare with URI  Hold on additonal abx for now  Check BNP and CBC .   Plan  Prednisone taper over next week.  Mucinex  DM Twice daily  As needed  Cough/congestion  Hydromet 1 tsp every 6hr as needed for cough , may make you sleepy.  Labs today .  Please contact office for sooner follow up if symptoms do not improve or worsen or seek emergency care  follow up Dr. Vaughan Browner in April as planned and As needed

## 2015-10-02 NOTE — Progress Notes (Signed)
   Subjective:    Patient ID: Tracey Nguyen, female    DOB: 24-Sep-1940, 75 y.o.   MRN: [id number]  HPI  75 y.o.F former smoker  with moderate persistent asthma, allergic rhinitis, OSA, restriction d/t scoliosis  On oxygen - 2L  On cpap At bedtime    10/02/2015 Acute OV  Pt presents for an acute office visit.  Seen 2 weeks ago for asthma flare with URI. CXR was neg for acute process.  She had some leg swelling. Venous doppler was neg for DVT.  Marland Kitchen Congestion is better and now clear but  Complains of  prod cough with white to clear thick colored mucus, chest tightness, SOB and wheezing at times. Denies any chest congestion, sinus pressure/drainage, fever, nausea or vomiting.  Wants cough syrup to help her sleep , cough is keeping her up at night. Has lots of drainage in her throat .     Review of Systems Constitutional:   No  weight loss, night sweats,  Fevers, chills,  +fatigue, or  lassitude.  HEENT:   No headaches,  Difficulty swallowing,  Tooth/dental problems, or  Sore throat,                No sneezing, itching, ear ache, nasal congestion, post nasal drip,   CV:  No chest pain,  Orthopnea, PND, swelling in lower extremities, anasarca, dizziness, palpitations, syncope.   GI  No heartburn, indigestion, abdominal pain, nausea, vomiting, diarrhea, change in bowel habits, loss of appetite, bloody stools.   Resp:    No chest wall deformity  Skin: no rash or lesions.  GU: no dysuria, change in color of urine, no urgency or frequency.  No flank pain, no hematuria   MS:  No joint pain or swelling.  No decreased range of motion.  No back pain.  Psych:  No change in mood or affect. No depression or anxiety.  No memory loss.         Objective:   Physical Exam   Filed Vitals:   10/02/15 1100  BP: 118/82  Pulse: 101  Temp: 98.8 F (37.1 C)  TempSrc: Oral  Height: 4\' 9"  (1.448 m)  Weight: 172 lb (78.019 kg)  SpO2: 94%    GEN: A/Ox3; pleasant , NAD,eldelry in wc   On O2   HEENT:  [place]/AT,  EACs-clear, TMs-wnl, NOSE-clear drainage  THROAT-clear, no lesions, no postnasal drip or exudate noted.   NECK:  Supple w/ fair ROM; no JVD; normal carotid impulses w/o bruits; no thyromegaly or nodules palpated; no lymphadenopathy.  RESP  Decreased BS  w/o, wheezes/ rales/ or rhonchi.scoliosis noted  no accessory muscle use, no dullness to percussion  CARD:  RRR, no m/r/g  , tr-1+ R>L  peripheral edema, neg calf pain, , pulses intact, no cyanosis or clubbing.  GI:   Soft & nt; nml bowel sounds; no organomegaly or masses detected.  Musco: Warm bil, no deformities or joint swelling noted.   Neuro: alert, no focal deficits noted.    Skin: Warm, no lesions or rashes         Assessment & Plan:

## 2015-10-03 ENCOUNTER — Ambulatory Visit: Payer: Medicare Other | Admitting: Adult Health

## 2015-10-06 NOTE — Progress Notes (Signed)
Quick Note:  Called and spoke with pt. Reviewed results and recs. Pt states she is wearing her CPAP at least 3 hours a night but states that the noise keeps her up at night. Pt voiced understanding and had no further questions. ______

## 2015-10-08 ENCOUNTER — Other Ambulatory Visit: Payer: Self-pay | Admitting: Family Medicine

## 2015-10-09 ENCOUNTER — Other Ambulatory Visit: Payer: Self-pay | Admitting: Pulmonary Disease

## 2015-10-10 ENCOUNTER — Telehealth: Payer: Self-pay | Admitting: Family Medicine

## 2015-10-10 ENCOUNTER — Telehealth: Payer: Self-pay | Admitting: *Deleted

## 2015-10-10 NOTE — Telephone Encounter (Signed)
I left a detailed message per Dr Maudie Mercury she reviewed the labs and she is due for a regular follow up and to call the office to schedule an appt.

## 2015-10-10 NOTE — Telephone Encounter (Signed)
Okay with me. I have not seen her in almost a year.

## 2015-10-10 NOTE — Telephone Encounter (Signed)
Pt is asking if it is ok to transfer to [facility] .Marland Kitchen

## 2015-10-13 NOTE — Telephone Encounter (Signed)
Tracey Nguyen with me. I see her husband

## 2015-10-17 ENCOUNTER — Other Ambulatory Visit: Payer: Self-pay | Admitting: *Deleted

## 2015-11-03 ENCOUNTER — Other Ambulatory Visit: Payer: Self-pay | Admitting: Family Medicine

## 2015-11-04 ENCOUNTER — Other Ambulatory Visit: Payer: Self-pay | Admitting: Pulmonary Disease

## 2015-11-04 MED ORDER — BUDESONIDE-FORMOTEROL FUMARATE 160-4.5 MCG/ACT IN AERO
2.0000 | INHALATION_SPRAY | Freq: Two times a day (BID) | RESPIRATORY_TRACT | Status: DC
Start: 2015-11-04 — End: 2016-03-22

## 2015-11-04 NOTE — Telephone Encounter (Signed)
Received faxed refill request Symbicort 160 Last seen 2.23.17 by TP Last ov w/ PM 12.20.16  Refills sent

## 2015-11-06 ENCOUNTER — Ambulatory Visit (INDEPENDENT_AMBULATORY_CARE_PROVIDER_SITE_OTHER): Payer: Medicare Other | Admitting: Neurology

## 2015-11-06 ENCOUNTER — Encounter: Payer: Self-pay | Admitting: Neurology

## 2015-11-06 VITALS — BP 124/86 | HR 96 | Resp 20 | Ht <= 58 in | Wt 165.0 lb

## 2015-11-06 DIAGNOSIS — G4736 Sleep related hypoventilation in conditions classified elsewhere: Secondary | ICD-10-CM | POA: Diagnosis not present

## 2015-11-06 DIAGNOSIS — Z9981 Dependence on supplemental oxygen: Secondary | ICD-10-CM | POA: Diagnosis not present

## 2015-11-06 DIAGNOSIS — J9610 Chronic respiratory failure, unspecified whether with hypoxia or hypercapnia: Secondary | ICD-10-CM | POA: Diagnosis not present

## 2015-11-06 DIAGNOSIS — J989 Respiratory disorder, unspecified: Secondary | ICD-10-CM

## 2015-11-06 DIAGNOSIS — M4120 Other idiopathic scoliosis, site unspecified: Secondary | ICD-10-CM

## 2015-11-06 NOTE — Progress Notes (Signed)
[facility]  Provider:  Dr Mae Cianci Referring Provider: Lucretia Kern, DO Primary Care Physician:  Lucretia Kern., DO  Chief Complaint  Patient presents with  . Follow-up    pt "hates" cpap,  cpap is not able to be downloaded in this office, rm 10    HPI:  Tracey Nguyen is a 75 y.o. female here as a follow up from [name], DO    for excessive daytime sleepiness , with apnea and oxygen need due to hypoventilation.  The patient has severe scoliosis and a non compliant chest wall.   She has been followed in this office since 2006. Last seen 6 month ago by my NP.  The patient a 75 year old Caucasian female patient, married, right-handed. She has a history of  obstructive apnea and uses CPAP with nasal pillows at night. The patient had a pulse oximetry on CPAP performed by using CPAP which confirmed the need for oxygen this was performed on 10-19-11 and the patient was in status of desaturation or hypoxemia for 4 hours and 47 minutes by being on CPAP. She has a portable and a 02 standard concentrator, non portable. She also has a history of restless leg syndrome- she is currently on Cymbalta and Mirapex tolerating both without side effects.   She is using CPAP for many years her last for a sleep study was in 2007 and her last sleep study in 09/09/2011 . At the time the patient had and was the Epworth Sleepiness Scale at 16 points the back inventory at 21 points her BMI was 37.7 her neck circumference measured at 18 inches. She had to increasing fatigue and sleepiness concerns she also needed to take fluid pills for swelling of the ankle and she needed Combivent for her restrictive breathing disorder. She had increasing dyspnea at the time.  Sleep study AHI was 87.7 her RDI of 92.7 , obstructive apneas in the majority of events , shallow breathing - likely attributed to her noncompliant chest wall.  The patient was titrated to 10 cm water and the residual AHI was 5.3  after CPAP was initiated her borderline tachycardia was reduced to the 80s in normal sinus rhythm. The patient had the CPAP data downloads in 2013,  as well as an overnight pulse oximetry.The pulse oximetry from 10-19-11 ;persistent low oxygen levels at or below 88% for 4 hours and 47 minutes at night. CPAP - residual apnea hypopnea index  between 6.9 and 7.7.  The patient CPAP machine is set at 13 cm  , She requires  2 liters oxygen due to her chest wall abnormality and hypoventilation. This is bled into her machine. The patient's restless legs were less well controlled now with anticipation around 7 PM, I increased the dose form 1 mg of Mirapex at night to 1.5 mg ,  I refilled 90 day supplies.   11-06-14,The patient is here today for her routine revisit she is followed by Dr. Sherren Mocha as her primary care physician Dr. Joya Gaskins is her pulmonologist. She will continue to see Dr. Maudie Mercury after Dr. Sherren Mocha will retire. The patient carries a diagnosis of sleep apnea with severe hypoventilation due to scoliosis and chest wall movement restriction. She was positive for CO2 retention. Is using oxygen 2 L at night. She needs it in daytime with exertion, too.  CPAP user , [facility] is DME - Linzie Collin , RT. The treatment for asthma that Dr. Joya Gaskins initiated has helped the patient's shortness of breath tremendously. CPAP Download  obtained : Baseline respiratory rate 18 while speaking , hoarseness is present.   11-06-2015, [name] has a history of excessive daytime sleepiness related to restricted chest wall  mobility, scoliosis related thoracic extension limitations. Also she is here today for a CPAP compliance visit, it is no longer possible to obtain data from her machine. She is definitely due for a new Pap machine but she also will need a new titration. Due to her hypoventilation status with hypercapnia, hypoxia and the severity of her apnea only and attended sleep study is indicated. This is severe degree of  scoliosis she has also developed loss of reflexes in her lower extremities and to some degree hyperreflexia in her upper extremities as well as bilateral grip strength loss. I support the family that this is related to spinal stenosis impairment. She does have some neck pain but not continuously so. She continues to use CPAP with oxygen at home and he will need a new sleep study to titrate her to one of the newer machines this can also be used for BiPAP if needed. The new study will be a split-night polysomnography with capnography. She has morning headaches.     Review of Systems: Out of a complete 14 system review, the patient complains of only the following symptoms, and all other reviewed systems are negative. Epworth  9 ,  On CPAP and 2 litres of 02 at night. FSS: 57 points, GDS 6 points. SOB . High Fall risk - uses walker  , one on one attention in sleep lab.    Social History   Social History  . Marital Status: Married    Spouse Name: [name]  . Number of Children: 1  . Years of Education: 12   Occupational History  .     Social History Main Topics  . Smoking status: Former Smoker -- 0.20 packs/day for 10 years    Types: Cigarettes    Quit date: 08/09/1964  . Smokeless tobacco: Never Used  . Alcohol Use: No  . Drug Use: No  . Sexual Activity: No   Other Topics Concern  . Not on file   Social History Narrative   patient needs to be seen face to face for oxygen qualification, RR is 16 , min. mallompati 4 and co2 retention from scoliosis overlap with severe OSA - AHI was 87 at baseline. titrated to 13 cm water CPAP, [facility] to follow.   Patient is married [name]) and lives at home with her husband.   Patient has one child.   Patient is retired.   Patient has a high school education.   Patient is right-handed.   Patient drinks very little caffeine.              History reviewed. No pertinent family history.  Past Medical History  Diagnosis Date  . Asthma   .  Arthritis   . Cancer [facility]) 2007    breast cancer  . GERD (gastroesophageal reflux disease)   . Hypertension   . Osteoporosis     tx with boniva in the past  . Sleep apnea     on cpap  . Blood transfusion     after femur surgery  . Cataract     BILATERAL -REMOVED VIA SURGERY  . History of D&C   . Pneumonia     hx of  . Headache(784.0)     hx of migraines  . Complication of anesthesia     hard to wake up in 1970  .  Alpha thalassemia minor trait   . Scoliosis/kyphoscoliosis   . 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 Oxygen dependent   . S/P removal of and implantation of new IM nail, right hip 10/30/2012  . COLONIC POLYPS, HX OF 08/17/2007    Qualifier: Diagnosis of  By: Sherren Mocha MD, Jory Ee   . Oxygen dependent     Past Surgical History  Procedure Laterality Date  . Femur fracture surgery  2011    titanium rod/right  . Mastectomy  2007    right  . Hemorrhoidectomy  1987  . Breast biopsy  1995    left  . Cataract extraction, bilateral  2006  . Dilation and curettage of uterus    . Femur im nail Right 10/30/2012    Procedure: revision right femur INTRAMEDULLARY NAILING;  Surgeon: Mauri Pole, MD;  Location: [facility];  Service: Orthopedics;  Laterality: Right;  . Hip surgery  10/30/12    Current Outpatient Prescriptions  Medication Sig Dispense Refill  . acetaminophen (TYLENOL) 325 MG tablet Take 325-650 mg by mouth. Taking once daily    . budesonide-formoterol (SYMBICORT) 160-4.5 MCG/ACT inhaler Inhale 2 puffs into the lungs 2 (two) times daily. 10.2 Inhaler 5  . cholecalciferol (VITAMIN D) 1000 UNITS tablet Take 1,000 Units by mouth daily.    . COMBIVENT RESPIMAT 20-100 MCG/ACT AERS respimat INHALE 1 PUFF INTO THE LUNGS EVERY 6 HOURS AS NEEDED FOR WHEEZING OR SHORTNESS OF BREATH 4 g 5  . dextromethorphan (DELSYM) 30 MG/5ML liquid 2 tsp by mouth twice daily as needed    . DULoxetine (CYMBALTA) 30 MG capsule TAKE 1 CAPSULE BY MOUTH ONCE DAILY 90  capsule 3  . fluticasone (FLONASE) 50 MCG/ACT nasal spray USE 1 SPRAY IN EACH NOSTRIL EVERY DAY 16 g 3  . furosemide (LASIX) 40 MG tablet TAKE 1 TABLET EVERY DAY BEFORE BREAKFAST 90 tablet 0  . losartan (COZAAR) 50 MG tablet TAKE 1 TABLET (50 MG TOTAL) BY MOUTH DAILY. *PT NEEDS APPT** 30 tablet 2  . polyethylene glycol (MIRALAX / GLYCOLAX) packet Take 17 g by mouth as needed.     . pramipexole (MIRAPEX) 1 MG tablet TAKE 1 & 1/2 TABLETS BY MOUTH ONCE EVERY EVENING 135 tablet 1  . Probiotic Product (ALIGN) 4 MG CAPS Take 1 capsule by mouth.     No current facility-administered medications for this visit.    Allergies as of 11/06/2015 - Review Complete 11/06/2015  Allergen Reaction Noted  . Augmentin [amoxicillin-pot clavulanate] Diarrhea 08/16/2014  . Codeine  05/25/2007    Vitals: BP 124/86 mmHg  Pulse 96  Resp 20  Ht 4' 9.5" (1.461 m)  Wt 165 lb (74.844 kg)  BMI 35.06 kg/m2 Last Weight:  Wt Readings from Last 1 Encounters:  11/06/15 165 lb (74.844 kg)   Last Height:   Ht Readings from Last 1 Encounters:  11/06/15 4' 9.5" (1.461 m)   Vision Screening:  Physical exam:  General: The patient is awake, alert and appears not in acute distress. The patient is well groomed. Head: Normocephalic, atraumatic.  Neck is supple, but ROM is  Low. . Mallampati 4, neck circumference: 22 inches  Cardiovascular:  Regular rate and rhythm, fast sinus.  without murmurs or carotid bruit.  Respiratory: Lungs are clear to auscultation. Minimal chest wall movements.   Skin:  Without evidence of edema, or rash Trunk: BMI is elevated and patient has diet instruction. She has severe scoliosis .  She has a short , thick  neck and un-compliant chestwall with tachypnoea at rest.  Neurologic exam : The patient is awake and alert, oriented to place and time. Memory subjective described as impaired to naming and word finding.  Delayed name and word finding by 1-2 minutes. There is a normal attention span &  concentration ability. Speech is fluent without dysarthria, but she has Dysphonia. Her speech is interrupted by breathing .   Mood and affect are appropriate.  Cranial nerves: Pupils are equal and briskly reactive to light. Funduscopic exam without  evidence of pallor or edema. Extraocular movements  in vertical and horizontal planes intact and without nystagmus. Visual fields by finger perimetry are intact. Hearing to finger rub intact.  Facial sensation intact to fine touch. Facial motor strength is symmetric and tongue and uvula move midline. Motor exam:   Normal tone and normal muscle bulk and symmetric normal strength in all extremities. Lesser grip strength on the right, carpal tunnel?  Sensory:  Fine touch, pinprick and vibration were tested in all extremities. Proprioception is tested in the upper extremities only. This was  normal. Coordination: Rapid alternating movements in the fingers/hands is tested and normal.  Finger-to-nose maneuver tested and normal without evidence of ataxia, dysmetria or tremor. Gait and station: Patient walks with walker, gets easily SOB, mainly sits in a wheelchair if she needs to cover a distance of 100 yards or longer and when  outdoors,  She often uses a walker indoors.  Deep tendon reflexes:loss bilaterally in  lower extremities  . Babinski maneuver response is lost, too. .   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 The hypoventilation/ hypopneas  as measured increase the AHI well into the 80s during the patient's last sleep study.  Her download showed in 2013 and 2014 residual AHI of 6.9 which is a good success. Awaitingretitration , split protocol with Co2 needed, One on One patient.   The patient in addition has asthma and central obstruction with the upper airway as well she has a  short neck and a restricted upper airway was a Mallampati 4.  Continuous use of CPAP with oxygen is needed. Epworth sleepiness test was today endorsed at 15 points which does indicate that Mrs. [facility] is very sleepy during the day. I believe that this part of fatigue and sleepiness is related to hypoxemia and hypercapnia not just at nighttime. She is urgently in need of renewal Pap machine and it is possible that a modality of BiPAP can help her more than CPAP. For this reason I order a split night polysomnography the patient may have to return for second night if he cannot titrate her sufficiently. My goal is to treat hypercapnia, hypoxemia and apnea in a patient with hypoventilation syndrome due to chest wall abnormality.    Plan:  Treatment plan and additional workup will be reviewed under Problem List.  To continue  oxygen and CPAP use this patient will need a new machine .  02 Needed 24 hours per day. SPLIT with capnography, one on one - requested female tech.   Emil Weigold, [place], DO

## 2015-11-06 NOTE — Patient Instructions (Signed)
Sleep Studies A sleep study (polysomnogram) is a series of tests done while you are sleeping. It can show how well you sleep. This can help your health care provider diagnose a sleep disorder and show how severe your sleep disorder is. A sleep study may lead to treatment that will help you sleep better and prevent other medical problems caused by poor sleep. If you have a sleep disorder, you may also be at risk for:   Sleep-related accidents.  High blood pressure.  Heart disease.  Stroke.  Other medical conditions. Sleep disorders are common. Your health care provider may suspect a sleep disorder if you:  Have loud snoring most nights.  Have brief periods when you stop breathing at night.  Feel sleepy on most days.  Fall asleep suddenly during the day.  Have trouble falling asleep or staying asleep.  Feel like you need to move your legs when trying to fall asleep.  Have dreams that seem very real shortly after falling asleep.  Feel like you cannot move when you first wake up. WHICH TESTS WILL I NEED TO HAVE?  Most sleep studies last all night and include these tests:  Recordings of your brain activity.  Recordings of your eye movements.  Recording of your heart rate and rhythm.  Blood pressure readings.  Readings of the amount of oxygen in your blood.  Measurements of your chest and belly movement as you breathe during sleep. If you have signs of the sleep disorder called sleep apnea during your test, you may get a mask to wear for the second half of the night.   The mask provides continuous positive airway pressure (CPAP). This may improve sleep apnea significantly.  You will then have all tests done again with the mask in place to see if your measurements and recordings change. HOW ARE SLEEP STUDIES DONE? Most sleep studies are done over one full night of sleep.   You will arrive at the study center in the evening and can go home in the morning.  Bring your  pajamas and toothbrush.  Do not have caffeine on the day of your sleep study.  Your health care provider will let you know if you need to stop taking any of your regular medicines before the test. To do the tests included in a polysomnogram, you will have:  Round, sticky patches with sensors attached to recording wires (electrodes) placed on your scalp, face, chest, and limbs.  Wires from all the electrodes and sensors run from your bed to a computer. The wires can be taken off and put back on if you need to get out of bed to go to the bathroom.  A sensor placed over your nose to measure airflow.  A finger clip put on one finger to measure your blood oxygen level.  A belt around your belly and a belt around your chest to measure breathing movements. WHERE ARE SLEEP STUDIES DONE?  Sleep studies are done at sleep centers. A sleep center may be inside a hospital, office, or clinic.  The room where you have the study may look like a hospital room or a hotel room. The health care providers doing the study may come in and out of the room during the study. Most of the time, they will be in another room monitoring your test.  HOW IS INFORMATION FROM SLEEP STUDIES HELPFUL? A polysomnogram can be used along with your medical history and a physical exam to diagnose conditions, such as:  Sleep   apnea.  Restless legs syndrome.  Sleep-related seizure disorders.  Sleep-related movement disorders. A medical doctor who specializes in sleep will evaluate your sleep study. The specialist will share the results with your primary health care provider. Treatments based on your sleep study may include:  Improving your sleep habits (sleep hygiene).  Wearing a CPAP mask.  Wearing an oral device at night to improve breathing and reduce snoring.  Taking medicine for:  Restless legs syndrome.  Sleep-related seizure disorder.  Sleep-related movement disorder.   This information is not intended to  replace advice given to you by your health care provider. Make sure you discuss any questions you have with your health care provider.   Document Released: 01/30/2003 Document Revised: 08/16/2014 Document Reviewed: 10/01/2013 Elsevier Interactive Patient Education 2016 [employer]   Epworth sleepiness test was today endorsed at 15 points which does indicate that [facility] is very sleepy during the day. I believe that this part of fatigue and sleepiness is related to hypoxemia and hypercapnia not just at nighttime. She is urgently in need of renewal Pap machine and it is possible that a modality of BiPAP can help her more than CPAP. For this reason I order a split night polysomnography the patient may have to return for second night if he cannot titrate her sufficiently. My goal is to treat hypercapnia, hypoxemia and apnea in a patient with hypoventilation syndrome due to chest wall abnormality.

## 2015-11-25 ENCOUNTER — Ambulatory Visit (INDEPENDENT_AMBULATORY_CARE_PROVIDER_SITE_OTHER): Payer: Medicare Other | Admitting: Neurology

## 2015-11-25 DIAGNOSIS — G4736 Sleep related hypoventilation in conditions classified elsewhere: Secondary | ICD-10-CM

## 2015-11-25 DIAGNOSIS — Z9981 Dependence on supplemental oxygen: Secondary | ICD-10-CM

## 2015-11-25 DIAGNOSIS — J989 Respiratory disorder, unspecified: Principal | ICD-10-CM

## 2015-11-25 DIAGNOSIS — M4120 Other idiopathic scoliosis, site unspecified: Secondary | ICD-10-CM

## 2015-11-25 DIAGNOSIS — G4733 Obstructive sleep apnea (adult) (pediatric): Secondary | ICD-10-CM | POA: Diagnosis not present

## 2015-11-25 DIAGNOSIS — J9610 Chronic respiratory failure, unspecified whether with hypoxia or hypercapnia: Secondary | ICD-10-CM

## 2015-11-25 NOTE — Progress Notes (Deleted)
Subjective:    Patient ID: Tracey Nguyen is a 75 y.o. female.  HPI {Common ambulatory SmartLinks:[id number]}  Review of Systems  Objective:  Neurologic Exam  Physical Exam  Assessment:   ***  Plan:   ***

## 2015-11-27 ENCOUNTER — Ambulatory Visit: Payer: Medicare Other | Admitting: Pulmonary Disease

## 2015-12-01 ENCOUNTER — Telehealth: Payer: Self-pay

## 2015-12-01 ENCOUNTER — Other Ambulatory Visit: Payer: Self-pay | Admitting: Family Medicine

## 2015-12-01 DIAGNOSIS — G4733 Obstructive sleep apnea (adult) (pediatric): Secondary | ICD-10-CM

## 2015-12-01 NOTE — Telephone Encounter (Signed)
Spoke to pt regarding her sleep study results. I advised her that her sleep study reveals such severe osa that immediate treatment would be advised. PAP therapy is indicated. Dr. Brett Fairy recommends proceeding with an in-lab study to optimize therapy and pt will remain on oxygen during titration. Pt verbalized understanding. Pt is agreeable to coming in for a cpap titration. I advised pt to avoid sedative-hypnotics which may worsen sleep apnea, alcohol and tobacco. Pt verbalized understanding. I advised pt that our sleep lab will call her to schedule the cpap titration. Pt verbalized understanding.

## 2015-12-05 NOTE — Progress Notes (Deleted)
Subjective:    Patient ID: Tracey Nguyen is a 75 y.o. female.  HPI   Review of Systems  Objective:  Neurologic Exam  Physical Exam  Assessment:     Plan:

## 2015-12-07 ENCOUNTER — Ambulatory Visit (INDEPENDENT_AMBULATORY_CARE_PROVIDER_SITE_OTHER): Payer: Medicare Other | Admitting: Neurology

## 2015-12-07 DIAGNOSIS — G4733 Obstructive sleep apnea (adult) (pediatric): Secondary | ICD-10-CM | POA: Diagnosis not present

## 2015-12-08 ENCOUNTER — Ambulatory Visit (INDEPENDENT_AMBULATORY_CARE_PROVIDER_SITE_OTHER)
Admission: RE | Admit: 2015-12-08 | Discharge: 2015-12-08 | Disposition: A | Discharge status: Home / Self Care | Payer: Medicare Other | Source: Ambulatory Visit | Attending: Pulmonary Disease | Admitting: Pulmonary Disease

## 2015-12-08 ENCOUNTER — Ambulatory Visit (INDEPENDENT_AMBULATORY_CARE_PROVIDER_SITE_OTHER): Payer: Medicare Other | Admitting: Pulmonary Disease

## 2015-12-08 ENCOUNTER — Encounter: Payer: Self-pay | Admitting: Pulmonary Disease

## 2015-12-08 VITALS — BP 128/74 | HR 110 | Ht <= 58 in | Wt 167.0 lb

## 2015-12-08 DIAGNOSIS — J45901 Unspecified asthma with (acute) exacerbation: Secondary | ICD-10-CM

## 2015-12-08 DIAGNOSIS — R079 Chest pain, unspecified: Secondary | ICD-10-CM

## 2015-12-08 NOTE — Patient Instructions (Signed)
We will set you up for a chest x-ray. Follow-up in 3 months.

## 2015-12-08 NOTE — Progress Notes (Signed)
Subjective:    Patient ID: Tracey Nguyen, female    DOB: May 25, 1941, 75 y.o.   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  HPI 75 y.o.F with moderate persistent asthma, allergic rhinitis, OSA, restriction due to scoliosis. She is a former patient of Dr. Joya Gaskins.   She was seen 2 times by Tammy Parrett in February 2017 for an asthma exacerbation. Treated with Z-Pak and prednisone taper. She feels closer to baseline now. She has developed right chest pain while moving around over the past few days. this appears to be musculoskeletal and reproducible in nature.  DATA: PFTS 07/09/11 FVC 1.88 (51%) FEV1 1.28 (62%) F/F 72 TLC 66% DLCO 36%, corrected DLCO 129%.  Past Medical History  Diagnosis Date  . Asthma   . Arthritis   . Cancer [facility]) 2007    breast cancer  . GERD (gastroesophageal reflux disease)   . Hypertension   . Osteoporosis     tx with boniva in the past  . Sleep apnea     on cpap  . Blood transfusion     after femur surgery  . Cataract     BILATERAL -REMOVED VIA SURGERY  . History of D&C   . Pneumonia     hx of  . Headache(784.0)     hx of migraines  . Complication of anesthesia     hard to wake up in 1970  . Alpha thalassemia minor trait   . Scoliosis/kyphoscoliosis   . 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 Oxygen dependent   . S/P removal of and implantation of new IM nail, right hip 10/30/2012  . COLONIC POLYPS, HX OF 08/17/2007    Qualifier: Diagnosis of  By: Sherren Mocha MD, Jory Ee   . Oxygen dependent      Current outpatient prescriptions:  .  acetaminophen (TYLENOL) 325 MG tablet, Take 325-650 mg by mouth. Taking once daily, Disp: , Rfl:  .  budesonide-formoterol (SYMBICORT) 160-4.5 MCG/ACT inhaler, Inhale 2 puffs into the lungs 2 (two) times daily., Disp: 10.2 Inhaler, Rfl: 5 .  cholecalciferol (VITAMIN D) 1000 UNITS tablet, Take 1,000  Units by mouth daily., Disp: , Rfl:  .  COMBIVENT RESPIMAT 20-100 MCG/ACT AERS respimat, INHALE 1 PUFF INTO THE LUNGS EVERY 6 HOURS AS NEEDED FOR WHEEZING OR SHORTNESS OF BREATH, Disp: 4 g, Rfl: 5 .  dextromethorphan (DELSYM) 30 MG/5ML liquid, 2 tsp by mouth twice daily as needed, Disp: , Rfl:  .  DULoxetine (CYMBALTA) 30 MG capsule, TAKE 1 CAPSULE BY MOUTH ONCE DAILY, Disp: 90 capsule, Rfl: 3 .  fluticasone (FLONASE) 50 MCG/ACT nasal spray, USE 1 SPRAY IN EACH NOSTRIL EVERY DAY, Disp: 16 g, Rfl: 3 .  furosemide (LASIX) 40 MG tablet, TAKE 1 TABLET EVERY DAY BEFORE BREAKFAST *PT NEEDS APPT**, Disp: 30 tablet, Rfl: 0 .  losartan (COZAAR) 50 MG tablet, TAKE 1 TABLET (50 MG TOTAL) BY MOUTH DAILY. *PT NEEDS APPT**, Disp: 30 tablet, Rfl: 2 .  polyethylene glycol (MIRALAX / GLYCOLAX) packet, Take 17 g by mouth as needed. , Disp: , Rfl:  .  pramipexole (MIRAPEX) 1 MG tablet, TAKE 1 & 1/2 TABLETS BY MOUTH ONCE EVERY EVENING, Disp: 135 tablet, Rfl: 1 .  Probiotic Product (ALIGN) 4 MG CAPS, Take 1 capsule by mouth daily. , Disp: , Rfl:   Review of Systems  Cough, chest tightness, dyspnea, wheezing, greenish sputum. Denies any fevers, chills. No chest pain,  palpitations No nausea, vomiting, diarrhea, constipation. All other review of systems are negative.     Objective:   Physical Exam Blood pressure 128/74, pulse 110, height 4\' 9"  (1.448 m), weight 167 lb (75.751 kg), SpO2 90 %. Gen: Mild distress Neuro: No gross focal deficits. Neck: No JVD, lymphadenopathy, thyromegaly. RS: Bibasilar crackles. No wheeze CVS: S1-S2 heard, no murmurs rubs gallops. Abdomen: Soft, positive bowel sounds. Extremities: No edema.    Assessment & Plan:  #1 Stable extrinsic asthma with recent exacerbation She appears to be stable now after antibiotics and steroids. We will continue the current inhalers. She's had right chest pain, possible pulled muscle, musculo skeletal in nature. I'll get a chest x-ray to evaluate  for any rib fracture.  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 Follow with Dr. Brett Fairy.   Plan: CXR Continue current inahlers  Marshell Garfinkel MD [facility] Pager [phone] If no answer or after 3pm call: [phone] 12/08/2015, 3:12 PM

## 2015-12-08 NOTE — Progress Notes (Signed)
Quick Note:  Called spoke with patient, advised of cxr results / recs as stated by PM. Pt verbalized her understanding and denied any questions. ______

## 2015-12-08 NOTE — Progress Notes (Deleted)
Subjective:    Patient ID: Tracey Nguyen is a 75 y.o. female.  HPI {Common ambulatory SmartLinks:[id number]}  Review of Systems  Objective:  Neurologic Exam  Physical Exam  Assessment:   ***  Plan:   ***

## 2015-12-10 ENCOUNTER — Ambulatory Visit (INDEPENDENT_AMBULATORY_CARE_PROVIDER_SITE_OTHER): Payer: Medicare Other | Admitting: Adult Health

## 2015-12-10 ENCOUNTER — Encounter: Payer: Self-pay | Admitting: Adult Health

## 2015-12-10 ENCOUNTER — Telehealth: Payer: Self-pay

## 2015-12-10 VITALS — BP 126/86 | HR 116 | Temp 98.3°F | Resp 18 | Ht <= 58 in | Wt 168.3 lb

## 2015-12-10 DIAGNOSIS — Z760 Encounter for issue of repeat prescription: Secondary | ICD-10-CM | POA: Diagnosis not present

## 2015-12-10 DIAGNOSIS — M810 Age-related osteoporosis without current pathological fracture: Secondary | ICD-10-CM

## 2015-12-10 DIAGNOSIS — I10 Essential (primary) hypertension: Secondary | ICD-10-CM

## 2015-12-10 DIAGNOSIS — Z7189 Other specified counseling: Secondary | ICD-10-CM | POA: Diagnosis not present

## 2015-12-10 DIAGNOSIS — G4731 Primary central sleep apnea: Secondary | ICD-10-CM

## 2015-12-10 DIAGNOSIS — Z7689 Persons encountering health services in other specified circumstances: Secondary | ICD-10-CM

## 2015-12-10 MED ORDER — LOSARTAN POTASSIUM 50 MG PO TABS: ORAL_TABLET | ORAL | Status: DC

## 2015-12-10 MED ORDER — FUROSEMIDE 40 MG PO TABS: 40.0000 mg | ORAL_TABLET | Freq: Every day | ORAL | Status: DC

## 2015-12-10 NOTE — Progress Notes (Signed)
Patient presents to clinic today to establish care. 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  Her last medicare wellness exam was on 12/11/2014    Acute Concerns: Establish Care  Chronic Issues: HTN, chronic LE edema,Obesity: -meds: losartan 50 mg, lasix 40mg  -reports she struggles with weight loss, loves certain foods -Denies: CP, palpitations, swelling  Moderate persistent asthma, chronic resp failure, OSA, restriction due to scoliosis:  -managed by her pulmonologist, Dr. Joya Gaskins -O2 continuous -cpap at night -symbicort and flonase dialy and combivent prn  Insomnia/RLS, sleep related hypoventilation due to chest wall disorder: -sees Dr. Brett Fairy for her sleep issues, scoliosis and RLS - takes miripex/duloxetine for this per her report (denies hx depression but on problem list) -on CPAP at night  Health Maintenance: Dental -- Twice a year - Dr. Ruffin Pyo  Vision -- Does not have one Immunizations --UTD  Colonoscopy -- 2012  Mammogram -- 12/2014  PAP -- No longer needed Bone Density --  Greater than 2 years. She does have osteoporosis.   She is followed by: 1. Dr. Vaughan Browner - Pulmonology 2. Dr. Brett Fairy - Neurology   Past Medical History  Diagnosis Date  . Asthma   . Arthritis   . Cancer [facility]) 2007    breast cancer  . GERD (gastroesophageal reflux disease)   . Hypertension   . Osteoporosis     tx with boniva in the past  . Sleep apnea     on cpap  . Blood  transfusion     after femur surgery  . Cataract     BILATERAL -REMOVED VIA SURGERY  . History of D&C   . Pneumonia     hx of  . Headache(784.0)     hx of migraines  . Complication of anesthesia     hard to wake up in 1970  . Alpha thalassemia minor trait   . Scoliosis/kyphoscoliosis   . 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 Oxygen dependent   . S/P removal of and implantation of new IM nail, right hip 10/30/2012  . COLONIC POLYPS, HX OF 08/17/2007    Qualifier: Diagnosis of  By: Sherren Mocha MD, Jory Ee   . Oxygen dependent     Past Surgical History  Procedure Laterality Date  . Femur fracture surgery  2011    titanium rod/right  . Mastectomy  2007    right  . Hemorrhoidectomy  1987  . Breast biopsy  1995    left  . Cataract extraction, bilateral  2006  . Dilation and curettage of uterus    . Femur im nail Right 10/30/2012    Procedure: revision right femur INTRAMEDULLARY NAILING;  Surgeon: Mauri Pole, MD;  Location: [facility];  Service: Orthopedics;  Laterality: Right;  . Hip surgery  10/30/12    Current Outpatient Prescriptions on File Prior to Visit  Medication Sig Dispense Refill  . acetaminophen (TYLENOL) 325 MG tablet Take 325-650 mg by mouth. Taking once daily    . budesonide-formoterol (SYMBICORT) 160-4.5 MCG/ACT inhaler Inhale 2 puffs into the lungs 2 (two) times daily. 10.2 Inhaler 5  .  cholecalciferol (VITAMIN D) 1000 UNITS tablet Take 1,000 Units by mouth daily.    . COMBIVENT RESPIMAT 20-100 MCG/ACT AERS respimat INHALE 1 PUFF INTO THE LUNGS EVERY 6 HOURS AS NEEDED FOR WHEEZING OR SHORTNESS OF BREATH 4 g 5  . dextromethorphan (DELSYM) 30 MG/5ML liquid 2 tsp by mouth twice daily as needed    . DULoxetine (CYMBALTA) 30 MG capsule TAKE 1 CAPSULE BY MOUTH ONCE DAILY 90 capsule 3  . fluticasone (FLONASE) 50 MCG/ACT nasal spray USE 1 SPRAY IN EACH NOSTRIL EVERY DAY 16 g 3  . furosemide (LASIX) 40 MG tablet TAKE 1 TABLET EVERY DAY BEFORE BREAKFAST *PT  NEEDS APPT** 30 tablet 0  . losartan (COZAAR) 50 MG tablet TAKE 1 TABLET (50 MG TOTAL) BY MOUTH DAILY. *PT NEEDS APPT** 30 tablet 2  . polyethylene glycol (MIRALAX / GLYCOLAX) packet Take 17 g by mouth as needed.     . pramipexole (MIRAPEX) 1 MG tablet TAKE 1 & 1/2 TABLETS BY MOUTH ONCE EVERY EVENING 135 tablet 1  . Probiotic Product (ALIGN) 4 MG CAPS Take 1 capsule by mouth daily.      No current facility-administered medications on file prior to visit.    Allergies  Allergen Reactions  . Augmentin [Amoxicillin-Pot Clavulanate] Diarrhea  . Codeine     REACTION: spacey    No family history on file.  Social History   Social History  . Marital Status: Married    Spouse Name: [name]  . Number of Children: 1  . Years of Education: 12   Occupational History  .     Social History Main Topics  . Smoking status: Former Smoker -- 0.20 packs/day for 10 years    Types: Cigarettes    Quit date: 08/09/1964  . Smokeless tobacco: Never Used  . Alcohol Use: No  . Drug Use: No  . Sexual Activity: No   Other Topics Concern  . Not on file   Social History Narrative   patient needs to be seen face to face for oxygen qualification, RR is 16 , min. mallompati 4 and co2 retention from scoliosis overlap with severe OSA - AHI was 87 at baseline. titrated to 13 cm water CPAP, [facility] to follow.   Patient is married [name]) and lives at home with her husband.   Patient has one child.   Patient is retired.   Patient has a high school education.   Patient is right-handed.   Patient drinks very little caffeine.              Review of Systems  Constitutional: Negative.   HENT: Negative.   Eyes: Negative.   Respiratory: Positive for cough and shortness of breath.   Cardiovascular: Positive for leg swelling (Left ).  Skin: Negative.   Neurological: Positive for tremors. Negative for dizziness, sensory change and loss of consciousness.  Psychiatric/Behavioral: Negative.   All other  systems reviewed and are negative.   There were no vitals taken for this visit.  Physical Exam  Constitutional: She is oriented to person, place, and time and well-developed, well-nourished, and in no distress. No distress.  HENT:  Head: Normocephalic and atraumatic.  Right Ear: External ear normal.  Left Ear: External ear normal.  Nose: Nose normal.  Mouth/Throat: Oropharynx is clear and moist. No oropharyngeal exudate.  Cardiovascular: Normal rate, regular rhythm, normal heart sounds and intact distal pulses.  Exam reveals no gallop and no friction rub.   No murmur heard. Pulmonary/Chest: Effort normal and breath sounds  normal. No respiratory distress. She has no wheezes. She has no rales. She exhibits no tenderness.  Musculoskeletal: She exhibits edema (trace edema in left foot).  Walks with rolling walker  Neurological: She is alert and oriented to person, place, and time. She has normal reflexes. No cranial nerve deficit. She exhibits normal muscle tone. Gait normal. GCS score is 15.  Skin: Skin is warm and dry. No rash noted. She is not diaphoretic. No erythema. No pallor.  Psychiatric: Mood, memory, affect and judgment normal.  Nursing note and vitals reviewed.   Recent Results (from the past 2160 hour(s))  CBC with Differential/Platelet     Status: Abnormal   Collection Time: 10/02/15 11:55 AM  Result Value Ref Range   WBC 12.8 (H) 4.0 - 10.5 K/uL   RBC 6.27 (H) 3.87 - 5.11 Mil/uL   Hemoglobin 12.4 12.0 - 15.0 g/dL   HCT 41.5 36.0 - 46.0 %   MCV 66.1 Rechecked and verified result. (L) 78.0 - 100.0 fl   MCHC 29.8 Rechecked and verified result. (L) 30.0 - 36.0 g/dL   RDW 15.2 11.5 - 15.5 %   Platelets 222.0 150.0 - 400.0 K/uL   Neutrophils Relative % 76.6 43.0 - 77.0 %   Lymphocytes Relative 11.9 (L) 12.0 - 46.0 %   Monocytes Relative 7.1 3.0 - 12.0 %   Eosinophils Relative 4.3 0.0 - 5.0 %   Basophils Relative 0.1 0.0 - 3.0 %   Neutro Abs 9.8 (H) 1.4 - 7.7 K/uL    Lymphs Abs 1.5 0.7 - 4.0 K/uL   Monocytes Absolute 0.9 0.1 - 1.0 K/uL   Eosinophils Absolute 0.5 0.0 - 0.7 K/uL   Basophils Absolute 0.0 0.0 - 0.1 K/uL  B Nat Peptide     Status: None   Collection Time: 10/02/15 11:55 AM  Result Value Ref Range   Pro B Natriuretic peptide (BNP) 25.0 0.0 - 100.0 pg/mL  Basic Metabolic Panel (BMET)     Status: Abnormal   Collection Time: 10/02/15 11:55 AM  Result Value Ref Range   Sodium 143 135 - 145 mEq/L   Potassium 3.9 3.5 - 5.1 mEq/L   Chloride 99 96 - 112 mEq/L   CO2 40 (H) 19 - 32 mEq/L   Glucose, Bld 106 (H) 70 - 99 mg/dL   BUN 19 6 - 23 mg/dL   Creatinine, Ser 1.26 (H) 0.40 - 1.20 mg/dL   Calcium 9.8 8.4 - 10.5 mg/dL   GFR 43.91 (L) >60.00 mL/min    Assessment/Plan: 1. Encounter to establish care - Reviewed all health maintenance activities and updated as needed - She is due for MWE - Follow up as needed  2. Osteoporosis - DG Bone Density; Future  3. Essential hypertension - Well controlled, no change in medication therapy - losartan (COZAAR) 50 MG tablet; TAKE 1 TABLET (50 MG TOTAL) BY MOUTH DAILY.  Dispense: 90 tablet; Refill: 3  4. Medication refill - losartan (COZAAR) 50 MG tablet; TAKE 1 TABLET (50 MG TOTAL) BY MOUTH DAILY.  Dispense: 90 tablet; Refill: 3 - furosemide (LASIX) 40 MG tablet; Take 1 tablet (40 mg total) by mouth daily.  Dispense: 90 tablet; Refill: 3   Dorothyann Peng, NP

## 2015-12-10 NOTE — Telephone Encounter (Signed)
Called to discuss sleep study results. No answer, left a message asking her to call me back.

## 2015-12-10 NOTE — Telephone Encounter (Signed)
Spoke to pt. I advised her that her sleep study revealed some improvement with less respiratory events during PAP titration. Dr. Roddie Mc recommends starting a machine that can serve as a cpap or bipap and bleed in 2 L of oxygen. Pt is agreeable to starting a new machine. She is already established with [facility] and is asking that the order be sent to them. I advised pt to avoid caffeine containing beverages and chocolate. A follow up appt was scheduled for 03/09/16 at 1:30. Pt verbalized understanding.

## 2015-12-10 NOTE — Patient Instructions (Signed)
It was great meeting you today!  Please follow up for your medicare wellness exam   Follow up for the other issues as well  Someone will call you to schedule your bone density  Please let me know if you need anything

## 2015-12-15 ENCOUNTER — Encounter: Payer: Self-pay | Admitting: *Deleted

## 2015-12-17 ENCOUNTER — Ambulatory Visit (INDEPENDENT_AMBULATORY_CARE_PROVIDER_SITE_OTHER)
Admission: RE | Admit: 2015-12-17 | Discharge: 2015-12-17 | Disposition: A | Discharge status: Home / Self Care | Payer: Medicare Other | Source: Ambulatory Visit | Attending: Adult Health | Admitting: Adult Health

## 2015-12-17 ENCOUNTER — Inpatient Hospital Stay: Admission: RE | Admit: 2015-12-17 | Payer: Medicare Other | Source: Ambulatory Visit

## 2015-12-17 DIAGNOSIS — M810 Age-related osteoporosis without current pathological fracture: Secondary | ICD-10-CM | POA: Diagnosis not present

## 2016-01-08 ENCOUNTER — Other Ambulatory Visit (INDEPENDENT_AMBULATORY_CARE_PROVIDER_SITE_OTHER): Payer: Medicare Other

## 2016-01-08 DIAGNOSIS — Z0000 Encounter for general adult medical examination without abnormal findings: Secondary | ICD-10-CM

## 2016-01-08 LAB — LIPID PANEL
CHOL/HDL RATIO: 5
Cholesterol: 200 mg/dL (ref 0–200)
HDL: 43.9 mg/dL (ref >39.00)
LDL CALC: 124 mg/dL — AB (ref 0–99)
NONHDL: 156.38
Triglycerides: 164 mg/dL — ABNORMAL HIGH (ref 0.0–149.0)
VLDL: 32.8 mg/dL (ref 0.0–40.0)

## 2016-01-08 LAB — HEPATIC FUNCTION PANEL
ALK PHOS: 66 U/L (ref 39–117)
ALT: 9 U/L (ref 0–35)
AST: 11 U/L (ref 0–37)
Albumin: 4.5 g/dL (ref 3.5–5.2)
BILIRUBIN DIRECT: 0.1 mg/dL (ref 0.0–0.3)
TOTAL PROTEIN: 6.8 g/dL (ref 6.0–8.3)
Total Bilirubin: 0.5 mg/dL (ref 0.2–1.2)

## 2016-01-08 LAB — CBC WITH DIFFERENTIAL/PLATELET
BASOS ABS: 0 10*3/uL (ref 0.0–0.1)
Basophils Relative: 0.4 % (ref 0.0–3.0)
Eosinophils Absolute: 0.7 10*3/uL (ref 0.0–0.7)
Eosinophils Relative: 6.9 % — ABNORMAL HIGH (ref 0.0–5.0)
HEMATOCRIT: 39.1 % (ref 36.0–46.0)
HEMOGLOBIN: 11.9 g/dL — AB (ref 12.0–15.0)
LYMPHS PCT: 16.8 % (ref 12.0–46.0)
Lymphs Abs: 1.7 10*3/uL (ref 0.7–4.0)
MCHC: 30.4 g/dL (ref 30.0–36.0)
MCV: 65.8 fl — ABNORMAL LOW (ref 78.0–100.0)
MONOS PCT: 5.9 % (ref 3.0–12.0)
Monocytes Absolute: 0.6 10*3/uL (ref 0.1–1.0)
NEUTROS ABS: 7 10*3/uL (ref 1.4–7.7)
Neutrophils Relative %: 70 % (ref 43.0–77.0)
Platelets: 250 10*3/uL (ref 150.0–400.0)
RBC: 5.94 Mil/uL — AB (ref 3.87–5.11)
RDW: 15.5 % (ref 11.5–15.5)
WBC: 10.1 10*3/uL (ref 4.0–10.5)

## 2016-01-08 LAB — BASIC METABOLIC PANEL
BUN: 25 mg/dL — ABNORMAL HIGH (ref 6–23)
CALCIUM: 10.6 mg/dL — AB (ref 8.4–10.5)
CHLORIDE: 99 meq/L (ref 96–112)
CO2: 37 meq/L — AB (ref 19–32)
Creatinine, Ser: 1.3 mg/dL — ABNORMAL HIGH (ref 0.40–1.20)
GFR: 42.33 mL/min — ABNORMAL LOW (ref >60.00)
Glucose, Bld: 95 mg/dL (ref 70–99)
Potassium: 4.3 mEq/L (ref 3.5–5.1)
SODIUM: 144 meq/L (ref 135–145)

## 2016-01-08 LAB — POC URINALSYSI DIPSTICK (AUTOMATED)
Bilirubin, UA: NEGATIVE
Glucose, UA: NEGATIVE
KETONES UA: NEGATIVE
Leukocytes, UA: NEGATIVE
Nitrite, UA: NEGATIVE
PROTEIN UA: NEGATIVE
SPEC GRAV UA: 1.015
UROBILINOGEN UA: 0.2
pH, UA: 6.5

## 2016-01-08 LAB — TSH: TSH: 2.49 u[IU]/mL (ref 0.35–4.50)

## 2016-01-13 ENCOUNTER — Telehealth: Payer: Self-pay

## 2016-01-13 NOTE — Telephone Encounter (Signed)
[facility] is advising [place] that pt is unable to tolerate her set bilevel pressures of I=24, E=20. She is having high mask leaks. I have printed a therapy report for your review.

## 2016-01-13 NOTE — Telephone Encounter (Signed)
I spoke to Dr. Brett Fairy. Before changing pt's pressures, she wants Robin, sleep lab manager to call pt and discuss a PAP nap.

## 2016-01-13 NOTE — Telephone Encounter (Signed)
LM for patient to return my call regarding scheduling a pap nap.

## 2016-01-13 NOTE — Telephone Encounter (Signed)
-----   Message from Leeroy Bock sent at 01/13/2016  8:11 AM EDT ----- Jonni Sanger wanted me to let you know that patient is unable to tolerate her set BiLevel pressures of I=24 E=20 extremely high mask leaks.  Please advise... Thanks! Stanton Kidney

## 2016-01-13 NOTE — Telephone Encounter (Signed)
I to 16 and E to 12 cm water.

## 2016-01-15 ENCOUNTER — Other Ambulatory Visit: Payer: Self-pay | Admitting: Family Medicine

## 2016-01-15 ENCOUNTER — Encounter: Payer: Self-pay | Admitting: Adult Health

## 2016-01-15 ENCOUNTER — Ambulatory Visit (INDEPENDENT_AMBULATORY_CARE_PROVIDER_SITE_OTHER): Payer: Medicare Other | Admitting: Adult Health

## 2016-01-15 VITALS — BP 132/90 | Temp 99.0°F | Ht <= 58 in | Wt 167.0 lb

## 2016-01-15 DIAGNOSIS — R5383 Other fatigue: Secondary | ICD-10-CM | POA: Diagnosis not present

## 2016-01-15 DIAGNOSIS — R251 Tremor, unspecified: Secondary | ICD-10-CM | POA: Diagnosis not present

## 2016-01-15 DIAGNOSIS — E785 Hyperlipidemia, unspecified: Secondary | ICD-10-CM

## 2016-01-15 DIAGNOSIS — R600 Localized edema: Secondary | ICD-10-CM | POA: Diagnosis not present

## 2016-01-15 DIAGNOSIS — I10 Essential (primary) hypertension: Secondary | ICD-10-CM | POA: Diagnosis not present

## 2016-01-15 NOTE — Patient Instructions (Signed)
It was great seeing you again.   I will follow up with you regarding your labs.   Increase your water intake.   You can try Melatonin to help you sleep. Take 30 minutes before bedtime. 2-3 mg

## 2016-01-15 NOTE — Telephone Encounter (Signed)
Ok to refill for one year  

## 2016-01-15 NOTE — Progress Notes (Signed)
Subjective:    Patient ID: Tracey Nguyen, female    DOB: 08/31/1940, 75 y.o.   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 her husband is with her today.   She presents to the office today for follow up due to hypertension and hyperlipidemia  She continues to see Dr. Vaughan Browner as her pulmonologist as well as Dr. Brett Fairy as her Neurologist.   Over all she feels as though she is doing " pretty good" . She continues to have issues with chronic fatigue/daytime sleepiness. She is only sleeping " 3-4" hours a night. related to restricted chest wall mobility, scoliosis related thoracic extension limitations. She is not wearing her supplemental oxygen during this visit. She sees Dr. Brett Fairy for her sleep issues.    She feels as though her blood pressures are well controlled. She is currently taking losartan 50mg  and lasix 40 mg. Her legs continue to be edematous. She is not eating a high sodium diet but is not drinking water either. She reports that she may drink 2-3 glasses of water today.    Review of Systems  Constitutional: Positive for activity change and fatigue.  HENT: Negative.   Eyes: Negative.   Respiratory: Positive for shortness of breath. Negative for cough, choking, chest tightness, wheezing and stridor.   Cardiovascular: Positive for leg swelling.  Gastrointestinal: Negative.   Endocrine: Negative.   Musculoskeletal: Positive for gait problem. Negative for joint swelling.    Skin: Negative.   Neurological: Positive for tremors and weakness. Negative for dizziness, facial asymmetry, light-headedness and headaches.  Hematological: Negative.   Psychiatric/Behavioral: Positive for sleep disturbance (insomnia).  All other systems reviewed and are negative.  Past Medical History  Diagnosis Date  . Asthma   . Arthritis   . Cancer [facility]) 2007    breast cancer  . GERD (gastroesophageal reflux disease)   . Hypertension   . Osteoporosis     tx with boniva in the past  . Sleep apnea     on cpap  . Blood transfusion     after femur surgery  . Cataract     BILATERAL -REMOVED VIA SURGERY  . History of D&C   . Pneumonia     hx of  . Headache(784.0)     hx of migraines  . Complication of anesthesia     hard to wake up in 1970  . Alpha thalassemia minor trait   . Scoliosis/kyphoscoliosis   . 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 Oxygen dependent   . S/P removal of and implantation of new IM nail, right hip 10/30/2012  . COLONIC POLYPS, HX OF 08/17/2007    Qualifier: Diagnosis of  By: Sherren Mocha MD, Jory Ee   . Oxygen dependent     Social History   Social History  . Marital Status: Married    Spouse Name: [name]  . Number of Children: 1  . Years of Education: 12   Occupational History  .     Social History Main Topics  . Smoking status: Former Smoker -- 0.20 packs/day for 10 years  Types: Cigarettes    Quit date: 08/09/1964  . Smokeless tobacco: Never Used  . Alcohol Use: No  . Drug Use: No  . Sexual Activity: No   Other Topics Concern  . Not on file   Social History Narrative   patient needs to be seen face to face for oxygen qualification, RR is 16 , min. mallompati 4 and co2 retention from scoliosis overlap with severe OSA - AHI was 87 at baseline. titrated to 13 cm water CPAP, [facility] to follow.   Patient is married [name]) and lives at home with her husband.   Patient has one child.   Patient is retired.   Patient  has a high school education.   Patient is right-handed.   Patient drinks very little caffeine.              Past Surgical History  Procedure Laterality Date  . Femur fracture surgery  2011    titanium rod/right  . Mastectomy  2007    right  . Hemorrhoidectomy  1987  . Breast biopsy  1995    left  . Cataract extraction, bilateral  2006  . Dilation and curettage of uterus    . Femur im nail Right 10/30/2012  . Hip surgery  10/30/12    Family History  Problem Relation Age of Onset  . Cancer Sister     Cervical ?   . Thalassemia Sister   . Dermatomyositis Mother   . Heart attack Father   . Tuberculosis Father     Allergies  Allergen Reactions  . Augmentin [Amoxicillin-Pot Clavulanate] Diarrhea  . Codeine     REACTION: spacey    Current Outpatient Prescriptions on File Prior to Visit  Medication Sig Dispense Refill  . acetaminophen (TYLENOL) 325 MG tablet Take 325-650 mg by mouth. Taking once daily    . budesonide-formoterol (SYMBICORT) 160-4.5 MCG/ACT inhaler Inhale 2 puffs into the lungs 2 (two) times daily. 10.2 Inhaler 5  . cholecalciferol (VITAMIN D) 1000 UNITS tablet Take 1,000 Units by mouth daily.    . COMBIVENT RESPIMAT 20-100 MCG/ACT AERS respimat INHALE 1 PUFF INTO THE LUNGS EVERY 6 HOURS AS NEEDED FOR WHEEZING OR SHORTNESS OF BREATH 4 g 5  . dextromethorphan (DELSYM) 30 MG/5ML liquid 2 tsp by mouth twice daily as needed    . fluticasone (FLONASE) 50 MCG/ACT nasal spray USE 1 SPRAY IN EACH NOSTRIL EVERY DAY 16 g 3  . furosemide (LASIX) 40 MG tablet Take 1 tablet (40 mg total) by mouth daily. 90 tablet 3  . losartan (COZAAR) 50 MG tablet TAKE 1 TABLET (50 MG TOTAL) BY MOUTH DAILY. 90 tablet 3  . polyethylene glycol (MIRALAX / GLYCOLAX) packet Take 17 g by mouth as needed.     . pramipexole (MIRAPEX) 1 MG tablet TAKE 1 & 1/2 TABLETS BY MOUTH ONCE EVERY EVENING 135 tablet 1  . Probiotic Product (ALIGN) 4 MG CAPS Take 1 capsule by mouth daily.      No current  facility-administered medications on file prior to visit.    BP 132/90 mmHg  Temp(Src) 99 F (37.2 C) (Oral)  Ht 4\' 9"  (1.448 m)  Wt 167 lb (75.751 kg)  BMI 36.13 kg/m2       Objective:   Physical Exam  Constitutional: She is oriented to person, place, and time. She appears well-developed and well-nourished. No distress.  HENT:  Head: Normocephalic and atraumatic.  Right Ear: External ear normal.  Left Ear: External ear normal.  Nose: Nose  normal.  Mouth/Throat: Oropharynx is clear and moist. No oropharyngeal exudate.  Eyes: Conjunctivae and EOM are normal. Pupils are equal, round, and reactive to light. Right eye exhibits no discharge. Left eye exhibits no discharge. No scleral icterus.  Neck: Normal range of motion. Neck supple. No thyromegaly present.  Cardiovascular: Normal rate, regular rhythm, normal heart sounds and intact distal pulses.  Exam reveals no gallop and no friction rub.   No murmur heard. Pulmonary/Chest: Effort normal. No respiratory distress. She has wheezes. She has no rales. She exhibits no tenderness.  Abdominal: Soft. Bowel sounds are normal. She exhibits no distension and no mass. There is no tenderness. There is no rebound and no guarding.  Genitourinary:  Deferred  Musculoskeletal: Normal range of motion. She exhibits edema (+ 1 pitting edema in bilateral lower extremities) and tenderness.  Lymphadenopathy:    She has no cervical adenopathy.  Neurological: She is alert and oriented to person, place, and time. She exhibits abnormal muscle tone. Coordination abnormal.  Tremor present  Skin: Skin is warm and dry. No rash noted. She is not diaphoretic. No erythema. No pallor.  Psychiatric: She has a normal mood and affect. Her behavior is normal. Judgment and thought content normal.  Nursing note and vitals reviewed.     Assessment & Plan:  1. Essential hypertension - EKG 12-Lead - Sinus Tachycardia with a rate of 105. Her pulse dropped into normal  range further into the appointment.  - Continue with current medications. Near goal  - Continue to monitoe   2. Other fatigue Likely from sleep disturbance in part in related by  restricted chest wall mobility and scoliosis related thoracic extension limitations. - Vitamin B12 - Continue to see Neuro   3. Bilateral edema of lower extremity - Continue with Lasix 40 mg - Compression socks - Increase water consumption  - Elevate legs as much as possible.   4. Hyperlipidemia - Currently taking 40 mg Zocor. Total cholesterol is borderline with elevated triglycerides ( have come down) and an elevated LDL - Continue to work on diet and exercise - No change in medication at this time  Dorothyann Peng, NP

## 2016-01-16 ENCOUNTER — Other Ambulatory Visit: Payer: Self-pay | Admitting: Pulmonary Disease

## 2016-01-16 LAB — VITAMIN B12: VITAMIN B 12: 194 pg/mL — AB (ref 211–911)

## 2016-01-21 ENCOUNTER — Telehealth: Payer: Self-pay

## 2016-01-21 DIAGNOSIS — G4731 Primary central sleep apnea: Secondary | ICD-10-CM

## 2016-01-21 NOTE — Telephone Encounter (Signed)
Patient came to sleep lab for a mask fitting and get adjusted to BIPAP. I fitted her with Res med Airfit N20 medium.She had a zero leak and tolerated this great. I had her on BIPAP 18/14. She feels she can tolerate this pressure. Also had her in supine position and still had no leak. Can we get [facility] to change this on her machine and update mask in system? Can they call her when machine has been changed? Jonni Sanger told her he could adjust this online.

## 2016-01-21 NOTE — Telephone Encounter (Signed)
Order for bipap pressure change sent to [facility].  

## 2016-01-26 ENCOUNTER — Other Ambulatory Visit: Payer: Self-pay | Admitting: Adult Health

## 2016-01-26 DIAGNOSIS — Z1231 Encounter for screening mammogram for malignant neoplasm of breast: Secondary | ICD-10-CM

## 2016-02-18 ENCOUNTER — Ambulatory Visit
Admission: RE | Admit: 2016-02-18 | Discharge: 2016-02-18 | Disposition: A | Discharge status: Home / Self Care | Payer: Medicare Other | Source: Ambulatory Visit | Attending: Adult Health | Admitting: Adult Health

## 2016-02-18 DIAGNOSIS — Z1231 Encounter for screening mammogram for malignant neoplasm of breast: Secondary | ICD-10-CM

## 2016-03-09 ENCOUNTER — Encounter: Payer: Self-pay | Admitting: Neurology

## 2016-03-09 ENCOUNTER — Ambulatory Visit (INDEPENDENT_AMBULATORY_CARE_PROVIDER_SITE_OTHER): Payer: Medicare Other | Admitting: Neurology

## 2016-03-09 VITALS — BP 110/78 | HR 96 | Resp 20 | Ht <= 58 in | Wt 173.0 lb

## 2016-03-09 DIAGNOSIS — G5600 Carpal tunnel syndrome, unspecified upper limb: Secondary | ICD-10-CM | POA: Diagnosis not present

## 2016-03-09 DIAGNOSIS — G4733 Obstructive sleep apnea (adult) (pediatric): Secondary | ICD-10-CM | POA: Diagnosis not present

## 2016-03-09 NOTE — Progress Notes (Signed)
[facility]  Provider:  Dr Skippy Marhefka Referring Provider: Dorothyann Peng, NP Primary Care Physician:  Dorothyann Peng, NP  Chief Complaint  Patient presents with  . Follow-up    bipap follow up    HPI:  Tracey Nguyen is a 75 y.o. female here as a follow up from [facility], DO    for excessive daytime sleepiness , with apnea and oxygen need due to hypoventilation.  The patient has severe scoliosis and a non compliant chest wall.   She has been followed in this office since 2006. Last seen 6 month ago by my NP.  The patient a 75 year old Caucasian female patient, married, right-handed. She has a history of  obstructive apnea and uses CPAP with nasal pillows at night. The patient had a pulse oximetry on CPAP performed by using CPAP which confirmed the need for oxygen this was performed on 10-19-11 and the patient was in status of desaturation or hypoxemia for 4 hours and 47 minutes by being on CPAP. She has a portable and a 02 standard concentrator, non portable. She also has a history of restless leg syndrome- she is currently on Cymbalta and Mirapex tolerating both without side effects.   She is using CPAP for many years her last for a sleep study was in 2007 and her last sleep study in 09/09/2011 . At the time the patient had and was the Epworth Sleepiness Scale at 16 points the back inventory at 21 points her BMI was 37.7 her neck circumference measured at 18 inches. She had to increasing fatigue and sleepiness concerns she also needed to take fluid pills for swelling of the ankle and she needed Combivent for her restrictive breathing disorder. She had increasing dyspnea at the time.  Sleep study AHI was 87.7 her RDI of 92.7 , obstructive apneas in the majority of events , shallow breathing - likely attributed to her noncompliant chest wall.  The patient was titrated to 10 cm water and the residual AHI was 5.3 after CPAP was initiated her borderline tachycardia was  reduced to the 80s in normal sinus rhythm. The patient had the CPAP data downloads in 2013,  as well as an overnight pulse oximetry.The pulse oximetry from 10-19-11 ;persistent low oxygen levels at or below 88% for 4 hours and 47 minutes at night. CPAP - residual apnea hypopnea index  between 6.9 and 7.7.The patient CPAP machine was set at 13 cm  , She requires  2 liters oxygen due to her chest wall abnormality and hypoventilation. This is bled into her machine. The patient's restless legs were less well controlled now with anticipation around 7 PM, I increased the dose form 1 mg of Mirapex at night to 1.5 mg ,  I refilled 90 day supplies.   11-06-14,The patient is here today for her routine revisit she is followed by Dr. Sherren Mocha as her primary care physician Dr. Joya Gaskins is her pulmonologist. She will continue to see Dr. Maudie Mercury after Dr. Sherren Mocha will retire. The patient carries a diagnosis of sleep apnea with severe hypoventilation due to scoliosis and chest wall movement restriction. She was positive for CO2 retention. Is using oxygen 2 L at night. She needs it in daytime with exertion, too.  CPAP user , [facility] is DME - Linzie Collin , RT. The treatment for asthma that Dr. Joya Gaskins initiated has helped the patient's shortness of breath tremendously. CPAP Download obtained : Baseline respiratory rate 18 while speaking , hoarseness is present.   11-06-2015, Tracey Nguyen  has a history of excessive daytime sleepiness related to restricted chest wall  mobility, scoliosis related thoracic extension limitations. Also she is here today for a CPAP compliance visit, it is no longer possible to obtain data from her machine. She is definitely due for a new Pap machine but she also will need a new titration. Due to her hypoventilation status with hypercapnia, hypoxia and the severity of her apnea only and attended sleep study is indicated. This is severe degree of scoliosis she has also developed loss of reflexes in her lower  extremities and to some degree hyperreflexia in her upper extremities as well as bilateral grip strength loss. I support the family that this is related to spinal stenosis impairment. She does have some neck pain but not continuously so. She continues to use CPAP with oxygen at home and he will need a new sleep study to titrate her to one of the newer machines this can also be used for BiPAP if needed. The new study will be a split-night polysomnography with capnography. She has morning headaches.   03-09-2016 I had the pleasure of seeing Tracey Nguyen today, who underwent a repeat polysomnography followed by BiPAP titration. The patient's primary sleep study was on 11-25-15 and documented an AHI of 56.9 in supine sleep 57.0 REM sleep was not seen. The study was explicitly requested for CO2 monitoring which increased to a maximum of 59.94 torr during non-REM sleep, oxygen level was 75% at its lowest and  156.7 minutes of desaturation. This study was followed  in 12 days by a BiPAP titration. The patient responded poorly to CPAP ( failed CPAP )  and clearly required BiPAP. The AHI was now reduced to 4.4 but a pressure of 18/14 cm water. The patient also reports that the new mask is working well for her she is using a nasal mask not a full face mask at home now. Airfit N 20 Medium.    Review of Systems: Out of a complete 14 system review, the patient complains of only the following symptoms, and all other reviewed systems are negative. Epworth  9 ,  On CPAP and 2 litres of 02 at night. FSS: 57 points, GDS 6 points. SOB . High Fall risk - uses walker  , one on one attention in sleep lab.    Social History   Social History  . Marital status: Married    Spouse name: [name]  . Number of children: 1  . Years of education: 38   Occupational History  .  Retired   Social History Main Topics  . Smoking status: Former Smoker    Packs/day: 0.20    Years: 10.00    Types: Cigarettes    Quit date:  08/09/1964  . Smokeless tobacco: Never Used  . Alcohol use No  . Drug use: No  . Sexual activity: No   Other Topics Concern  . Not on file   Social History Narrative   patient needs to be seen face to face for oxygen qualification, RR is 16 , min. mallompati 4 and co2 retention from scoliosis overlap with severe OSA - AHI was 87 at baseline. titrated to 13 cm water CPAP, [facility] to follow.   Patient is married [name]) and lives at home with her husband.   Patient has one child.   Patient is retired.   Patient has a high school education.   Patient is right-handed.   Patient drinks very little caffeine.  Family History  Problem Relation Age of Onset  . Cancer Sister     Cervical ?   . Thalassemia Sister   . Dermatomyositis Mother   . Heart attack Father   . Tuberculosis Father     Past Medical History:  Diagnosis Date  . Alpha thalassemia minor trait   . Arthritis   . Asthma   . Blood transfusion    after femur surgery  . Cancer [facility]) 2007   breast cancer  . Cataract    BILATERAL -REMOVED VIA SURGERY  . COLONIC POLYPS, HX OF 08/17/2007   Qualifier: Diagnosis of  By: Sherren Mocha MD, Jory Ee   . Complication of anesthesia    hard to wake up in 1970  . GERD (gastroesophageal reflux disease)   . Headache(784.0)    hx of migraines  . History of D&C   . Hypertension   . Osteoporosis    tx with boniva in the past  . Oxygen dependent   . Oxygen dependent   . Pneumonia    hx of  . S/P removal of and implantation of new IM nail, right hip 10/30/2012  . Scoliosis/kyphoscoliosis   . Sleep apnea    on cpap  . 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 BREAST BIOPSY  1995   left  . CATARACT EXTRACTION, BILATERAL  2006  . DILATION AND CURETTAGE OF UTERUS    . FEMUR FRACTURE SURGERY  2011   titanium rod/right  . FEMUR IM NAIL Right 10/30/2012  . hemorrhoidectomy  1987  . HIP SURGERY   10/30/12  . MASTECTOMY  2007   right    Current Outpatient Prescriptions  Medication Sig Dispense Refill  . acetaminophen (TYLENOL) 325 MG tablet Take 325-650 mg by mouth. Taking once daily    . budesonide-formoterol (SYMBICORT) 160-4.5 MCG/ACT inhaler Inhale 2 puffs into the lungs 2 (two) times daily. 10.2 Inhaler 5  . cholecalciferol (VITAMIN D) 1000 UNITS tablet Take 1,000 Units by mouth daily.    . COMBIVENT RESPIMAT 20-100 MCG/ACT AERS respimat INHALE 1 PUFF INTO THE LUNGS EVERY 6 HOURS AS NEEDED FOR WHEEZING OR SHORTNESS OF BREATH 4 g 5  . dextromethorphan (DELSYM) 30 MG/5ML liquid 2 tsp by mouth twice daily as needed    . DULoxetine (CYMBALTA) 30 MG capsule TAKE 1 CAPSULE BY MOUTH ONCE DAILY 90 capsule 3  . fluticasone (FLONASE) 50 MCG/ACT nasal spray USE 1 SPRAY IN EACH NOSTRIL EVERY DAY 16 g 5  . furosemide (LASIX) 40 MG tablet Take 1 tablet (40 mg total) by mouth daily. 90 tablet 3  . losartan (COZAAR) 50 MG tablet TAKE 1 TABLET (50 MG TOTAL) BY MOUTH DAILY. 90 tablet 3  . Multiple Vitamins-Minerals (PRESERVISION AREDS PO) Take by mouth.    . polyethylene glycol (MIRALAX / GLYCOLAX) packet Take 17 g by mouth as needed.     . pramipexole (MIRAPEX) 1 MG tablet TAKE 1 & 1/2 TABLETS BY MOUTH ONCE EVERY EVENING 135 tablet 1  . Probiotic Product (ALIGN) 4 MG CAPS Take 1 capsule by mouth daily.     . vitamin B-12 (CYANOCOBALAMIN) 100 MCG tablet Take 100 mcg by mouth daily.     No current facility-administered medications for this visit.     Allergies as of 03/09/2016 - Review Complete 03/09/2016  Allergen Reaction Noted  . Augmentin [amoxicillin-pot clavulanate] Diarrhea 08/16/2014  . Codeine  05/25/2007    Vitals: BP  110/78   Pulse 96   Resp 20   Ht 4\' 9"  (1.448 m)   Wt 173 lb (78.5 kg)   BMI 37.44 kg/m  Last Weight:  Wt Readings from Last 1 Encounters:  03/09/16 173 lb (78.5 kg)   Last Height:   Ht Readings from Last 1 Encounters:  03/09/16 4\' 9"  (1.448 m)   Vision  Screening:  Physical exam:  General: The patient is awake, alert and appears not in acute distress. The patient is well groomed. Head: Normocephalic, atraumatic.  Neck is supple, but ROM is  Low. . Mallampati 4, neck circumference: 22 inches  Cardiovascular:  Regular rate and rhythm, fast sinus.  without murmurs or carotid bruit.  Respiratory: Lungs are clear to auscultation. Minimal chest wall movements.   Skin:  Without evidence of edema, or rash Trunk: BMI is elevated and patient has diet instruction. She has severe scoliosis .  She has a short , thick neck and un-compliant chestwall with tachypnoea at rest.  Neurologic exam :The patient is awake and alert, oriented to place and time. Memory subjective described as impaired to naming and word finding.  Delayed name and word finding by 1-2 minutes. There is a normal attention span & concentration ability. Speech is fluent without dysarthria, but she has Dysphonia. Her speech is interrupted by breathing .  Mood and affect are appropriate.  Cranial nerves: Pupils are equal and briskly reactive to light. Funduscopic exam without  evidence of pallor or edema. Extraocular movements  in vertical and horizontal planes intact and without nystagmus. Visual fields by finger perimetry are intact. Hearing to finger rub intact.  Facial sensation intact to fine touch. Facial motor strength is symmetric and tongue and uvula move midline.   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  2) BiPAP dependent OSA patient -doing very well on 18/14 cm water without ST settings easily brief function is on a mild AHI is 4.4, average user time is 6 hours 34 minutes with 100% compliance..  3) oxygen dependent at night - 2 L per nasal cannula bled into BiPAP. 4) a new concern of possible carpal  tunnel syndrome. The patient described her thumb mostly on her left hand to her and the pain ascends through them antebrachial area. The thenar eminence appears atrophied. This is actually also true for her right hand. I will send her for nerve conduction study. Tinel sign was positive. If the nerve conduction study confirms medial nerve impingement I will order of wrist braces for her overnight should she feels no improvement after 30 days of using the wrist braces I will refer her to a hand surgeon..   Plan:  Treatment plan and additional workup will be reviewed under Problem List.   Larey Seat, MD   Cc [name], DO Dr Fredna Dow. Handsurgeon.

## 2016-03-09 NOTE — Patient Instructions (Signed)

## 2016-03-22 ENCOUNTER — Ambulatory Visit (INDEPENDENT_AMBULATORY_CARE_PROVIDER_SITE_OTHER): Payer: Medicare Other | Admitting: Pulmonary Disease

## 2016-03-22 ENCOUNTER — Encounter: Payer: Self-pay | Admitting: Pulmonary Disease

## 2016-03-22 VITALS — BP 102/76 | HR 98 | Ht <= 58 in | Wt 172.4 lb

## 2016-03-22 DIAGNOSIS — J4541 Moderate persistent asthma with (acute) exacerbation: Secondary | ICD-10-CM | POA: Diagnosis not present

## 2016-03-22 MED ORDER — BUDESONIDE-FORMOTEROL FUMARATE 160-4.5 MCG/ACT IN AERO: 2.0000 | INHALATION_SPRAY | Freq: Two times a day (BID) | RESPIRATORY_TRACT | 5 refills | Status: AC

## 2016-03-22 MED ORDER — IPRATROPIUM-ALBUTEROL 20-100 MCG/ACT IN AERS: 1.0000 | INHALATION_SPRAY | Freq: Four times a day (QID) | RESPIRATORY_TRACT | 5 refills | Status: DC | PRN

## 2016-03-22 NOTE — Addendum Note (Signed)
Addended by: Mathis Dad on: 03/22/2016 02:05 PM   Modules accepted: Orders

## 2016-03-22 NOTE — Progress Notes (Signed)
Subjective:    Patient ID: Tracey Nguyen, female    DOB: 01/10/1941, 75 y.o.   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  HPI 75 y.o.F with moderate persistent asthma, allergic rhinitis, OSA, restriction due to scoliosis. She is a former patient of Dr. Joya Gaskins.   She was seen in February 2017 for an asthma exacerbation. Treated with Z-Pak and prednisone taper. She has improved with the symptoms since then for the past few days she reports increased tightness in the chest. She denies any cough, sputum production, wheezing, fevers, chills. She feels this is due to the increased humidity.  DATA: PFTS 07/09/11 FVC 1.88 (51%) FEV1 1.28 (62%) F/F 72 TLC 66% DLCO 36%, corrected DLCO 129%.  Past Medical History:  Diagnosis Date  . Alpha thalassemia minor trait   . Arthritis   . Asthma   . Blood transfusion    after femur surgery  . Cancer [facility]) 2007   breast cancer  . Cataract    BILATERAL -REMOVED VIA SURGERY  . COLONIC POLYPS, HX OF 08/17/2007   Qualifier: Diagnosis of  By: Sherren Mocha MD, Jory Ee   . Complication of anesthesia    hard to wake up in 1970  . GERD (gastroesophageal reflux disease)   . Headache(784.0)    hx of migraines  . History of D&C   . Hypertension   . Osteoporosis    tx with boniva in the past  . Oxygen dependent   . Oxygen dependent   . Pneumonia    hx of  . S/P removal of and implantation of new IM nail, right hip 10/30/2012  . Scoliosis/kyphoscoliosis   . Sleep apnea    on cpap  . Sleep-related hypoventilation due to chest wall disorder      Current Outpatient Prescriptions:  .  acetaminophen (TYLENOL) 325 MG tablet, Take 325-650 mg by mouth. Taking once daily, Disp: , Rfl:  .  budesonide-formoterol (SYMBICORT) 160-4.5 MCG/ACT inhaler, Inhale 2 puffs into the lungs 2 (two) times daily., Disp: 10.2 Inhaler, Rfl: 5 .  cholecalciferol (VITAMIN D) 1000  UNITS tablet, Take 1,000 Units by mouth daily., Disp: , Rfl:  .  COMBIVENT RESPIMAT 20-100 MCG/ACT AERS respimat, INHALE 1 PUFF INTO THE LUNGS EVERY 6 HOURS AS NEEDED FOR WHEEZING OR SHORTNESS OF BREATH, Disp: 4 g, Rfl: 5 .  dextromethorphan (DELSYM) 30 MG/5ML liquid, 2 tsp by mouth twice daily as needed, Disp: , Rfl:  .  DULoxetine (CYMBALTA) 30 MG capsule, TAKE 1 CAPSULE BY MOUTH ONCE DAILY, Disp: 90 capsule, Rfl: 3 .  fluticasone (FLONASE) 50 MCG/ACT nasal spray, USE 1 SPRAY IN EACH NOSTRIL EVERY DAY, Disp: 16 g, Rfl: 5 .  furosemide (LASIX) 40 MG tablet, Take 1 tablet (40 mg total) by mouth daily., Disp: 90 tablet, Rfl: 3 .  losartan (COZAAR) 50 MG tablet, TAKE 1 TABLET (50 MG TOTAL) BY MOUTH DAILY., Disp: 90 tablet, Rfl: 3 .  Multiple Vitamins-Minerals (PRESERVISION AREDS PO), Take 1 tablet by mouth 2 (two) times daily. , Disp: , Rfl:  .  polyethylene glycol (MIRALAX / GLYCOLAX) packet, Take 17 g by mouth as needed. , Disp: , Rfl:  .  pramipexole (MIRAPEX) 1 MG tablet, TAKE 1 & 1/2 TABLETS BY MOUTH ONCE EVERY EVENING, Disp: 135 tablet, Rfl: 1 .  Probiotic Product (ALIGN) 4 MG CAPS, Take 1 capsule by mouth daily. , Disp: , Rfl:  .  vitamin B-12 (CYANOCOBALAMIN) 100 MCG  tablet, Take 100 mcg by mouth daily., Disp: , Rfl:   Review of Systems  + chest tightness, No cough,dyspnea, wheezing, greenish sputum. Denies any fevers, chills. No chest pain, palpitations No nausea, vomiting, diarrhea, constipation. All other review of systems are negative.     Objective:   Physical Exam Blood pressure 102/76, pulse 98, height 4\' 9"  (1.448 m), weight 172 lb 6.4 oz (78.2 kg), SpO2 92 %. Gen: Mild distress Neuro: No gross focal deficits. Neck: No JVD, lymphadenopathy, thyromegaly. RS: Bibasilar crackles. No wheeze CVS: S1-S2 heard, no murmurs rubs gallops. Abdomen: Soft, positive bowel sounds. Extremities: No edema.    Assessment & Plan:  #1 Stable extrinsic asthma She appears to be stable.  Although she reports some chest tightness she is not wheezing in the office today. I'll hold off on giving her any prednisone as she has history of osteoporosis.. We will continue the current inhalers.   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 Follow with Dr. Brett Fairy.   Plan: Continue current inhalers.  Return in 6 months.  Marshell Garfinkel MD [facility] Pager [phone] If no answer or after 3pm call: [phone] 03/22/2016, 1:40 PM

## 2016-03-22 NOTE — Patient Instructions (Signed)
Continue using your inhalers as prescribed. Please call us if there is any worsening of his symptoms.  Return to clinic in 6 months.

## 2016-03-26 ENCOUNTER — Encounter: Payer: Self-pay | Admitting: Internal Medicine

## 2016-04-05 ENCOUNTER — Telehealth: Payer: Self-pay | Admitting: Pulmonary Disease

## 2016-04-05 MED ORDER — DOXYCYCLINE HYCLATE 100 MG PO TABS: 100.0000 mg | ORAL_TABLET | Freq: Two times a day (BID) | ORAL | 0 refills | Status: DC

## 2016-04-05 MED ORDER — PREDNISONE 10 MG PO TABS: ORAL_TABLET | ORAL | 0 refills | Status: DC

## 2016-04-05 NOTE — Telephone Encounter (Signed)
Pred taper starting at 40 mg. Reduce dose by 10 mg every 2 days. Doxy 100mg  bid for 7 days.

## 2016-04-05 NOTE — Telephone Encounter (Signed)
Called and spoke to pt. Pt c/o increase in SOB, prod cough with clear mucus, chest tightness, and blowing out green mucus from nose x 3-4 days. Pt denies headache, f/c/s. Pt states she is taking delsym at night and this seems to help. Pt is requesting prednisone. Pt last seen on 8/14 by Dr. Vaughan Browner and advised to f/u in 6 months.    Dr. Vaughan Browner please advise. Thanks.   Allergies  Allergen Reactions  . Augmentin [Amoxicillin-Pot Clavulanate] Diarrhea  . Codeine     REACTION: spacey

## 2016-04-05 NOTE — Telephone Encounter (Signed)
Called and spoke to pt. Informed her of the recs per PM. Rx sent to preferred pharmacy. Pt verbalized understanding and denied any further questions or concerns at this time.

## 2016-04-06 ENCOUNTER — Other Ambulatory Visit: Payer: Self-pay

## 2016-04-08 ENCOUNTER — Encounter: Payer: Medicare Other | Admitting: Diagnostic Neuroimaging

## 2016-06-09 ENCOUNTER — Emergency Department (HOSPITAL_COMMUNITY)
Admission: EM | Admit: 2016-06-09 | Discharge: 2016-06-09 | Disposition: A | Discharge status: Home / Self Care | Payer: Medicare Other | Source: Home / Self Care | Attending: Physician Assistant | Admitting: Physician Assistant

## 2016-06-09 ENCOUNTER — Encounter (HOSPITAL_COMMUNITY): Payer: Self-pay

## 2016-06-09 ENCOUNTER — Telehealth: Payer: Self-pay | Admitting: Pulmonary Disease

## 2016-06-09 ENCOUNTER — Emergency Department (HOSPITAL_COMMUNITY): Payer: Medicare Other

## 2016-06-09 DIAGNOSIS — N390 Urinary tract infection, site not specified: Secondary | ICD-10-CM

## 2016-06-09 DIAGNOSIS — Z853 Personal history of malignant neoplasm of breast: Secondary | ICD-10-CM | POA: Insufficient documentation

## 2016-06-09 DIAGNOSIS — J45909 Unspecified asthma, uncomplicated: Secondary | ICD-10-CM | POA: Insufficient documentation

## 2016-06-09 DIAGNOSIS — Z79899 Other long term (current) drug therapy: Secondary | ICD-10-CM | POA: Insufficient documentation

## 2016-06-09 DIAGNOSIS — R319 Hematuria, unspecified: Secondary | ICD-10-CM | POA: Insufficient documentation

## 2016-06-09 DIAGNOSIS — R000 Tachycardia, unspecified: Secondary | ICD-10-CM | POA: Diagnosis not present

## 2016-06-09 DIAGNOSIS — K921 Melena: Secondary | ICD-10-CM | POA: Diagnosis not present

## 2016-06-09 DIAGNOSIS — I10 Essential (primary) hypertension: Secondary | ICD-10-CM | POA: Insufficient documentation

## 2016-06-09 DIAGNOSIS — Z87891 Personal history of nicotine dependence: Secondary | ICD-10-CM | POA: Insufficient documentation

## 2016-06-09 LAB — URINE MICROSCOPIC-ADD ON: Bacteria, UA: NONE SEEN

## 2016-06-09 LAB — COMPREHENSIVE METABOLIC PANEL
ALBUMIN: 3.5 g/dL (ref 3.5–5.0)
ALT: 13 U/L — ABNORMAL LOW (ref 14–54)
ANION GAP: 11 (ref 5–15)
AST: 14 U/L — ABNORMAL LOW (ref 15–41)
Alkaline Phosphatase: 64 U/L (ref 38–126)
BILIRUBIN TOTAL: 0.7 mg/dL (ref 0.3–1.2)
BUN: 71 mg/dL — ABNORMAL HIGH (ref 6–20)
CO2: 30 mmol/L (ref 22–32)
Calcium: 10 mg/dL (ref 8.9–10.3)
Chloride: 101 mmol/L (ref 101–111)
Creatinine, Ser: 1.36 mg/dL — ABNORMAL HIGH (ref 0.44–1.00)
GFR calc non Af Amer: 37 mL/min — ABNORMAL LOW (ref >60)
GFR, EST AFRICAN AMERICAN: 43 mL/min — AB (ref >60)
GLUCOSE: 161 mg/dL — AB (ref 65–99)
POTASSIUM: 4.3 mmol/L (ref 3.5–5.1)
SODIUM: 142 mmol/L (ref 135–145)
TOTAL PROTEIN: 6.1 g/dL — AB (ref 6.5–8.1)

## 2016-06-09 LAB — CBC WITH DIFFERENTIAL/PLATELET
BASOS ABS: 0 10*3/uL (ref 0.0–0.1)
Basophils Relative: 0 %
EOS ABS: 0.3 10*3/uL (ref 0.0–0.7)
Eosinophils Relative: 2 %
HEMATOCRIT: 31.7 % — AB (ref 36.0–46.0)
Hemoglobin: 9.4 g/dL — ABNORMAL LOW (ref 12.0–15.0)
LYMPHS ABS: 2.1 10*3/uL (ref 0.7–4.0)
Lymphocytes Relative: 15 %
MCH: 19.9 pg — ABNORMAL LOW (ref 26.0–34.0)
MCHC: 29.7 g/dL — AB (ref 30.0–36.0)
MCV: 67.2 fL — ABNORMAL LOW (ref 78.0–100.0)
Monocytes Absolute: 0.8 10*3/uL (ref 0.1–1.0)
Monocytes Relative: 6 %
NEUTROS ABS: 10.5 10*3/uL — AB (ref 1.7–7.7)
Neutrophils Relative %: 77 %
Platelets: 244 10*3/uL (ref 150–400)
RBC: 4.72 MIL/uL (ref 3.87–5.11)
RDW: 15.4 % (ref 11.5–15.5)
WBC: 13.7 10*3/uL — ABNORMAL HIGH (ref 4.0–10.5)

## 2016-06-09 LAB — URINALYSIS, ROUTINE W REFLEX MICROSCOPIC
BILIRUBIN URINE: NEGATIVE
Glucose, UA: NEGATIVE mg/dL
KETONES UR: NEGATIVE mg/dL
Nitrite: NEGATIVE
PROTEIN: NEGATIVE mg/dL
Specific Gravity, Urine: 1.012 (ref 1.005–1.030)
pH: 6 (ref 5.0–8.0)

## 2016-06-09 LAB — I-STAT CG4 LACTIC ACID, ED
LACTIC ACID, VENOUS: 2.87 mmol/L — AB (ref 0.5–1.9)
LACTIC ACID, VENOUS: 1.14 mmol/L (ref 0.5–1.9)

## 2016-06-09 LAB — D-DIMER, QUANTITATIVE (NOT AT ARMC): D DIMER QUANT: 0.27 ug{FEU}/mL (ref 0.00–0.50)

## 2016-06-09 LAB — I-STAT TROPONIN, ED: TROPONIN I, POC: 0.01 ng/mL (ref 0.00–0.08)

## 2016-06-09 MED ORDER — SODIUM CHLORIDE 0.9 % IV BOLUS (SEPSIS): 1000.0000 mL | Freq: Once | INTRAVENOUS | Status: DC

## 2016-06-09 MED ORDER — SODIUM CHLORIDE 0.9 % IV BOLUS (SEPSIS)
1000.0000 mL | Freq: Once | INTRAVENOUS | Status: AC
  Administered 2016-06-09: 1000 mL via INTRAVENOUS

## 2016-06-09 MED ORDER — CEPHALEXIN 500 MG PO CAPS: 500.0000 mg | ORAL_CAPSULE | Freq: Four times a day (QID) | ORAL | 0 refills | Status: DC

## 2016-06-09 MED ORDER — CEPHALEXIN 250 MG PO CAPS
500.0000 mg | ORAL_CAPSULE | Freq: Once | ORAL | Status: AC
  Administered 2016-06-09: 500 mg via ORAL
  Filled 2016-06-09: qty 2

## 2016-06-09 NOTE — Progress Notes (Signed)
Orthopedic Tech Progress Note Patient Details:  Tracey Nguyen September 15, 1940 [id number]  Ortho Devices Type of Ortho Device: Ulna gutter splint Ortho Device/Splint Location: lue  Ortho Device/Splint Interventions: Ordered, Application   Karolee Stamps 06/09/2016, 11:54 PM

## 2016-06-09 NOTE — ED Triage Notes (Signed)
Patient here with tachycardia throughout last night and today. On home oxygen all the time and arrived with sats of 90%, no O2 being used on arrival. Denies CP but can feel her heart beating fast

## 2016-06-09 NOTE — Telephone Encounter (Signed)
Spoke with patient-aware that TP suggests going to ER. PM not reachable at this time. Pt aware and nothing more needed at this time.

## 2016-06-09 NOTE — ED Provider Notes (Addendum)
[facility] DEPT Provider Note   CSN: [id number] Arrival date & time: 06/09/16  1828     History   Chief Complaint Chief Complaint  Patient presents with  . Tachycardia    HPI Tracey Nguyen is a 75 y.o. female.  HPI   Patient is a pleasant 84 her old female with chronic lung issues, oxygen dependent, history of arthritis asthma sleep apnea and obesity. She is presenting today with tachycardia. Patient has pulse ox at home and had noticed that her heart rate was above 100 over the course of today. Patient began feeling poorly last night with nonspecific malaise. Patient has been urinating frequently. Has noted no increase in shortness of breath cough or congestion.  Past Medical History:  Diagnosis Date  . Alpha thalassemia minor trait   . Arthritis   . Asthma   . Blood transfusion    after femur surgery  . Cancer [facility]) 2007   breast cancer  . Cataract    BILATERAL -REMOVED VIA SURGERY  . COLONIC POLYPS, HX OF 08/17/2007   Qualifier: Diagnosis of  By: Sherren Mocha MD, Jory Ee   . Complication of anesthesia    hard to wake up in 1970  . GERD (gastroesophageal reflux disease)   . Headache(784.0)    hx of migraines  . History of D&C   . Hypertension   . Osteoporosis    tx with boniva in the past  . Oxygen dependent   . Oxygen dependent   . Pneumonia    hx of  . S/P removal of and implantation of new IM nail, right hip 10/30/2012  . Scoliosis/kyphoscoliosis   . Sleep apnea    on cpap  . Sleep-related hypoventilation due to chest wall disorder     Patient Active Problem List   Diagnosis Date Noted  . Supplemental oxygen dependent 11/06/2015  . Leg swelling 09/16/2015  . Scoliosis (and kyphoscoliosis), idiopathic 11/06/2014  . OSA on CPAP 11/06/2014  . Chronic respiratory failure ([place]) 11/05/2013  . 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 Restless legs syndrome 04/30/2013  . Obstructive sleep apnea 11/16/2012  . Obesity (BMI 30.0-34.9)  10/31/2012  . PERIPHERAL NEUROPATHY 09/17/2008  . Depression 08/17/2007  . ALLERGIC RHINITIS 08/17/2007  . Extrinsic asthma 08/17/2007  . GERD 08/17/2007  . Essential hypertension 08/17/2007    Past Surgical History:  Procedure Laterality Date  . BREAST BIOPSY  1995   left  . CATARACT EXTRACTION, BILATERAL  2006  . DILATION AND CURETTAGE OF UTERUS    . FEMUR FRACTURE SURGERY  2011   titanium rod/right  . FEMUR IM NAIL Right 10/30/2012  . hemorrhoidectomy  1987  . HIP SURGERY  10/30/12  . MASTECTOMY  2007   right    OB History    No data available       Home Medications    Prior to Admission medications   Medication Sig Start Date End Date Taking? Authorizing Provider  budesonide-formoterol (SYMBICORT) 160-4.5 MCG/ACT inhaler Inhale 2 puffs into the lungs 2 (two) times daily. 03/22/16  Yes Praveen Mannam, MD  cholecalciferol (VITAMIN D) 1000 UNITS tablet Take 1,000 Units by mouth daily.   Yes Historical Provider, MD  dextromethorphan (DELSYM) 30 MG/5ML liquid Take 60 mg by mouth 2 (two) times daily as needed for cough. 2 tsp by mouth twice daily as needed    Yes Historical Provider, MD  DULoxetine (CYMBALTA) 30 MG capsule TAKE 1 CAPSULE BY MOUTH ONCE DAILY 01/15/16  Yes  Dorothyann Peng, NP  fluticasone (FLONASE) 50 MCG/ACT nasal spray USE 1 SPRAY IN EACH NOSTRIL EVERY DAY 01/19/16  Yes Praveen Mannam, MD  furosemide (LASIX) 40 MG tablet Take 1 tablet (40 mg total) by mouth daily. 12/10/15  Yes Dorothyann Peng, NP  Ipratropium-Albuterol (COMBIVENT RESPIMAT) 20-100 MCG/ACT AERS respimat Inhale 1 puff into the lungs every 6 (six) hours as needed for wheezing. 03/22/16  Yes Praveen Mannam, MD  losartan (COZAAR) 50 MG tablet TAKE 1 TABLET (50 MG TOTAL) BY MOUTH DAILY. Patient taking differently: Take 75 mg by mouth at bedtime. TAKE 1 TABLET (50 MG TOTAL) BY MOUTH DAILY. 12/10/15  Yes Dorothyann Peng, NP  Multiple Vitamins-Minerals (PRESERVISION AREDS PO) Take 1 tablet by mouth 2 (two) times  daily.    Yes Historical Provider, MD  pramipexole (MIRAPEX) 1 MG tablet TAKE 1 & 1/2 TABLETS BY MOUTH ONCE EVERY EVENING 07/23/15  Yes Larey Seat, MD  predniSONE (DELTASONE) 10 MG tablet Take 40mg  x 2 days, then 30mg  x 2 days, then 20mg  x 2 days, then 10mg  x 2 days. 04/05/16  Yes Praveen Mannam, MD  Probiotic Product (ALIGN) 4 MG CAPS Take 1 capsule by mouth daily.    Yes Historical Provider, MD  vitamin B-12 (CYANOCOBALAMIN) 100 MCG tablet Take 100 mcg by mouth daily.   Yes Historical Provider, MD  acetaminophen (TYLENOL) 325 MG tablet Take 325-650 mg by mouth. Taking once daily 11/16/12   Ivan Anchors Love, PA-C  doxycycline (VIBRA-TABS) 100 MG tablet Take 1 tablet (100 mg total) by mouth 2 (two) times daily. Patient not taking: Reported on 06/09/2016 04/05/16   Marshell Garfinkel, MD  polyethylene glycol (MIRALAX / GLYCOLAX) packet Take 17 g by mouth as needed for mild constipation.  11/02/12   Danae Orleans, PA-C    Family History Family History  Problem Relation Age of Onset  . Dermatomyositis Mother   . Heart attack Father   . Tuberculosis Father   . Cancer Sister     Cervical ?   . Thalassemia Sister     Social History Social History  Substance Use Topics  . Smoking status: Former Smoker    Packs/day: 0.20    Years: 10.00    Types: Cigarettes    Quit date: 08/09/1964  . Smokeless tobacco: Never Used  . Alcohol use No     Allergies   Augmentin [amoxicillin-pot clavulanate] and Codeine   Review of Systems Review of Systems  Constitutional: Positive for fatigue and fever. Negative for activity change.  Respiratory: Negative for cough, chest tightness, shortness of breath and wheezing.   Cardiovascular: Negative for chest pain.  Gastrointestinal: Negative for abdominal pain.  Genitourinary: Positive for frequency. Negative for decreased urine volume, difficulty urinating and dysuria.  Neurological: Positive for weakness.  All other systems reviewed and are  negative.    Physical Exam Updated Vital Signs BP 110/70   Pulse 113   Temp 99.6 F (37.6 C) (Rectal)   Resp (!) 27   Ht 4\' 9"  (1.448 m)   Wt 165 lb (74.8 kg)   SpO2 100%   BMI 35.71 kg/m   Physical Exam  Constitutional: She is oriented to person, place, and time. She appears well-developed and well-nourished.  HENT:  Head: Normocephalic and atraumatic.  Eyes: Right eye exhibits no discharge.  Cardiovascular: Regular rhythm.   tachycardia  Pulmonary/Chest:  Baseline tachypnea  Abdominal: Soft. There is no tenderness.  Musculoskeletal: She exhibits edema.  Bilateral pitting edema.  Neurological: She is oriented to person,  place, and time.  Skin: Skin is warm and dry. She is not diaphoretic.  Psychiatric: She has a normal mood and affect.  Nursing note and vitals reviewed.    ED Treatments / Results  Labs (all labs ordered are listed, but only abnormal results are displayed) Labs Reviewed  CBC WITH DIFFERENTIAL/PLATELET - Abnormal; Notable for the following:       Result Value   WBC 13.7 (*)    Hemoglobin 9.4 (*)    HCT 31.7 (*)    MCV 67.2 (*)    MCH 19.9 (*)    MCHC 29.7 (*)    Neutro Abs 10.5 (*)    All other components within normal limits  COMPREHENSIVE METABOLIC PANEL - Abnormal; Notable for the following:    Glucose, Bld 161 (*)    BUN 71 (*)    Creatinine, Ser 1.36 (*)    Total Protein 6.1 (*)    AST 14 (*)    ALT 13 (*)    GFR calc non Af Amer 37 (*)    GFR calc Af Amer 43 (*)    All other components within normal limits  URINALYSIS, ROUTINE W REFLEX MICROSCOPIC (NOT AT [facility]) - Abnormal; Notable for the following:    Hgb urine dipstick SMALL (*)    Leukocytes, UA TRACE (*)    All other components within normal limits  URINE MICROSCOPIC-ADD ON - Abnormal; Notable for the following:    Squamous Epithelial / LPF 0-5 (*)    Casts HYALINE CASTS (*)    All other components within normal limits  I-STAT CG4 LACTIC ACID, ED - Abnormal; Notable for the  following:    Lactic Acid, Venous 2.87 (*)    All other components within normal limits  URINE CULTURE  D-DIMER, QUANTITATIVE (NOT AT [facility])  I-STAT TROPOININ, ED  I-STAT CG4 LACTIC ACID, ED    EKG  EKG Interpretation  Date/Time:  Wednesday June 09 2016 18:38:57 EDT Ventricular Rate:  130 PR Interval:  116 QRS Duration: 74 QT Interval:  278 QTC Calculation: 409 R Axis:   32 Text Interpretation:  Sinus tachycardia with Premature supraventricular complexes Possible Lateral infarct , age undetermined Possible Inferior infarct , age undetermined Abnormal ECG Sinus tachycardia Confirmed by Gerald Leitz ([id number]) on 06/09/2016 8:39:36 PM       Radiology Dg Chest 2 View  Result Date: 06/09/2016 CLINICAL DATA:  Acute onset of shortness of breath, nausea, fever and tachycardia. Initial encounter. EXAM: CHEST  2 VIEW COMPARISON:  Chest radiograph performed 12/08/2015 FINDINGS: There is mild elevation of the left hemidiaphragm. Mild bibasilar atelectasis is noted. There is no evidence of pleural effusion or pneumothorax. The heart is borderline normal in size. No acute osseous abnormalities are seen. A small hiatal hernia is suggested. IMPRESSION: 1. Mild elevation of the left hemidiaphragm. Mild bibasilar atelectasis noted. 2. Small hiatal hernia suggested. Electronically Signed   By: Garald Balding M.D.   On: 06/09/2016 20:52    Procedures Procedures (including critical care time)  Medications Ordered in ED Medications  sodium chloride 0.9 % bolus 1,000 mL (not administered)  sodium chloride 0.9 % bolus 1,000 mL (1,000 mLs Intravenous New Bag/Given 06/09/16 2006)     Initial Impression / Assessment and Plan / ED Course  I have reviewed the triage vital signs and the nursing notes.  Pertinent labs & imaging results that were available during my care of the patient were reviewed by me and considered in my medical decision making (see chart  for details).  Clinical Course     Patient is a pleasant 75 year old female oxygen dependent with this intrinsic lung disease, OSA, heart failure, presenting today with tachycardia. Patient reports feeling ill last night nonspecifically. Patient also reports she's been using the bathroom more frequently. Patient's tachycardia at 130 here. I suspect some type of infection given her low-grade fever. Concern for urinary versus pulmonary infection. Will get lactic, chest x-ray, UA. Given patient's tachycardia we'll get d-dimer to rule out pulmonary embolism. We'll gently fluid resuscitate given her tachycardia.  10:19 PM Urine intimidate. Given low-grade fever and symptoms would treat with short course of antibiotics. No back pain. NO ho kidney stones. Patient normal heart rate at 110. Patient reports that this is her baseline. She is therefore baseline after 1 L fluid. Patient feels improved. Patient occasionally she has bruising on her left hand that she was going to go to see orthopedics physician for tomorrow. We'll do an x-ray here as a Manufacturing engineer.  Patietn at her baseline pulmonary status.   Xray shows farcture- will splint and have her keep her appointment tomorrow.   Patient is comfortable, ambulatory, and taking PO at time of discharge.  Patient expressed understanding about return precautions.    Final Clinical Impressions(s) / ED Diagnoses   Final diagnoses:  None    New Prescriptions New Prescriptions   No medications on file     Shoshana Johal Julio Alm, MD 06/09/16 1937    Abdikadir Fohl Julio Alm, MD 06/09/16 2311

## 2016-06-09 NOTE — Discharge Instructions (Signed)
You were found to have a urinary tract infection. Additionally have a fracture of your hand. Please follow up with orthopedics as you are planning tomorrow. Please use antibiotic to treat your urinary tract infection and return with any concerns.

## 2016-06-11 ENCOUNTER — Inpatient Hospital Stay (HOSPITAL_COMMUNITY)
Admission: EM | Admit: 2016-06-11 | Discharge: 2016-06-18 | DRG: 004 | Payer: Medicare Other | Attending: Internal Medicine | Admitting: Internal Medicine

## 2016-06-11 ENCOUNTER — Encounter (HOSPITAL_COMMUNITY): Payer: Self-pay | Admitting: Emergency Medicine

## 2016-06-11 ENCOUNTER — Emergency Department (HOSPITAL_COMMUNITY): Payer: Medicare Other

## 2016-06-11 DIAGNOSIS — N189 Chronic kidney disease, unspecified: Secondary | ICD-10-CM | POA: Diagnosis present

## 2016-06-11 DIAGNOSIS — Z9010 Acquired absence of unspecified breast and nipple: Secondary | ICD-10-CM

## 2016-06-11 DIAGNOSIS — M810 Age-related osteoporosis without current pathological fracture: Secondary | ICD-10-CM | POA: Diagnosis present

## 2016-06-11 DIAGNOSIS — Z7951 Long term (current) use of inhaled steroids: Secondary | ICD-10-CM

## 2016-06-11 DIAGNOSIS — J9811 Atelectasis: Secondary | ICD-10-CM | POA: Diagnosis present

## 2016-06-11 DIAGNOSIS — Z86010 Personal history of colonic polyps: Secondary | ICD-10-CM

## 2016-06-11 DIAGNOSIS — Z886 Allergy status to analgesic agent status: Secondary | ICD-10-CM

## 2016-06-11 DIAGNOSIS — R571 Hypovolemic shock: Secondary | ICD-10-CM | POA: Diagnosis present

## 2016-06-11 DIAGNOSIS — K921 Melena: Principal | ICD-10-CM | POA: Diagnosis present

## 2016-06-11 DIAGNOSIS — K317 Polyp of stomach and duodenum: Secondary | ICD-10-CM | POA: Diagnosis present

## 2016-06-11 DIAGNOSIS — K259 Gastric ulcer, unspecified as acute or chronic, without hemorrhage or perforation: Secondary | ICD-10-CM | POA: Diagnosis present

## 2016-06-11 DIAGNOSIS — K922 Gastrointestinal hemorrhage, unspecified: Secondary | ICD-10-CM | POA: Diagnosis present

## 2016-06-11 DIAGNOSIS — R000 Tachycardia, unspecified: Secondary | ICD-10-CM | POA: Diagnosis present

## 2016-06-11 DIAGNOSIS — K2980 Duodenitis without bleeding: Secondary | ICD-10-CM | POA: Diagnosis present

## 2016-06-11 DIAGNOSIS — E869 Volume depletion, unspecified: Secondary | ICD-10-CM | POA: Diagnosis present

## 2016-06-11 DIAGNOSIS — K269 Duodenal ulcer, unspecified as acute or chronic, without hemorrhage or perforation: Secondary | ICD-10-CM | POA: Diagnosis present

## 2016-06-11 DIAGNOSIS — M1990 Unspecified osteoarthritis, unspecified site: Secondary | ICD-10-CM | POA: Diagnosis present

## 2016-06-11 DIAGNOSIS — Z853 Personal history of malignant neoplasm of breast: Secondary | ICD-10-CM

## 2016-06-11 DIAGNOSIS — D62 Acute posthemorrhagic anemia: Secondary | ICD-10-CM | POA: Diagnosis present

## 2016-06-11 DIAGNOSIS — J9601 Acute respiratory failure with hypoxia: Secondary | ICD-10-CM

## 2016-06-11 DIAGNOSIS — E870 Hyperosmolality and hypernatremia: Secondary | ICD-10-CM | POA: Diagnosis present

## 2016-06-11 DIAGNOSIS — J984 Other disorders of lung: Secondary | ICD-10-CM | POA: Diagnosis present

## 2016-06-11 DIAGNOSIS — J811 Chronic pulmonary edema: Secondary | ICD-10-CM | POA: Diagnosis present

## 2016-06-11 DIAGNOSIS — Z809 Family history of malignant neoplasm, unspecified: Secondary | ICD-10-CM

## 2016-06-11 DIAGNOSIS — Z6836 Body mass index (BMI) 36.0-36.9, adult: Secondary | ICD-10-CM

## 2016-06-11 DIAGNOSIS — R579 Shock, unspecified: Secondary | ICD-10-CM

## 2016-06-11 DIAGNOSIS — K210 Gastro-esophageal reflux disease with esophagitis: Secondary | ICD-10-CM | POA: Diagnosis present

## 2016-06-11 DIAGNOSIS — Z4659 Encounter for fitting and adjustment of other gastrointestinal appliance and device: Secondary | ICD-10-CM

## 2016-06-11 DIAGNOSIS — T148XXA Other injury of unspecified body region, initial encounter: Secondary | ICD-10-CM

## 2016-06-11 DIAGNOSIS — N179 Acute kidney failure, unspecified: Secondary | ICD-10-CM | POA: Diagnosis present

## 2016-06-11 DIAGNOSIS — K2900 Acute gastritis without bleeding: Secondary | ICD-10-CM | POA: Diagnosis present

## 2016-06-11 DIAGNOSIS — Z79899 Other long term (current) drug therapy: Secondary | ICD-10-CM

## 2016-06-11 DIAGNOSIS — Z9981 Dependence on supplemental oxygen: Secondary | ICD-10-CM

## 2016-06-11 DIAGNOSIS — D649 Anemia, unspecified: Secondary | ICD-10-CM

## 2016-06-11 DIAGNOSIS — Z978 Presence of other specified devices: Secondary | ICD-10-CM

## 2016-06-11 DIAGNOSIS — E669 Obesity, unspecified: Secondary | ICD-10-CM | POA: Diagnosis present

## 2016-06-11 DIAGNOSIS — R578 Other shock: Secondary | ICD-10-CM | POA: Diagnosis present

## 2016-06-11 DIAGNOSIS — Z01818 Encounter for other preprocedural examination: Secondary | ICD-10-CM

## 2016-06-11 DIAGNOSIS — Z9841 Cataract extraction status, right eye: Secondary | ICD-10-CM

## 2016-06-11 DIAGNOSIS — J45909 Unspecified asthma, uncomplicated: Secondary | ICD-10-CM | POA: Diagnosis present

## 2016-06-11 DIAGNOSIS — Z9842 Cataract extraction status, left eye: Secondary | ICD-10-CM

## 2016-06-11 DIAGNOSIS — E876 Hypokalemia: Secondary | ICD-10-CM | POA: Diagnosis present

## 2016-06-11 DIAGNOSIS — R0682 Tachypnea, not elsewhere classified: Secondary | ICD-10-CM

## 2016-06-11 DIAGNOSIS — G4733 Obstructive sleep apnea (adult) (pediatric): Secondary | ICD-10-CM | POA: Diagnosis present

## 2016-06-11 DIAGNOSIS — I10 Essential (primary) hypertension: Secondary | ICD-10-CM | POA: Diagnosis present

## 2016-06-11 DIAGNOSIS — J9621 Acute and chronic respiratory failure with hypoxia: Secondary | ICD-10-CM | POA: Diagnosis present

## 2016-06-11 DIAGNOSIS — Z87891 Personal history of nicotine dependence: Secondary | ICD-10-CM

## 2016-06-11 LAB — COMPREHENSIVE METABOLIC PANEL
ALBUMIN: 2.4 g/dL — AB (ref 3.5–5.0)
ALT: 9 U/L — AB (ref 14–54)
AST: 12 U/L — AB (ref 15–41)
Alkaline Phosphatase: 37 U/L — ABNORMAL LOW (ref 38–126)
Anion gap: 9 (ref 5–15)
BUN: 116 mg/dL — AB (ref 6–20)
CHLORIDE: 109 mmol/L (ref 101–111)
CO2: 22 mmol/L (ref 22–32)
CREATININE: 2.39 mg/dL — AB (ref 0.44–1.00)
Calcium: 7.5 mg/dL — ABNORMAL LOW (ref 8.9–10.3)
GFR calc Af Amer: 22 mL/min — ABNORMAL LOW (ref >60)
GFR calc non Af Amer: 19 mL/min — ABNORMAL LOW (ref >60)
GLUCOSE: 112 mg/dL — AB (ref 65–99)
POTASSIUM: 3.5 mmol/L (ref 3.5–5.1)
Sodium: 140 mmol/L (ref 135–145)
Total Bilirubin: 0.4 mg/dL (ref 0.3–1.2)
Total Protein: 4.1 g/dL — ABNORMAL LOW (ref 6.5–8.1)

## 2016-06-11 LAB — URINALYSIS, ROUTINE W REFLEX MICROSCOPIC
Bilirubin Urine: NEGATIVE
Glucose, UA: NEGATIVE mg/dL
Hgb urine dipstick: NEGATIVE
Ketones, ur: NEGATIVE mg/dL
Nitrite: NEGATIVE
Protein, ur: NEGATIVE mg/dL
Specific Gravity, Urine: 1.015 (ref 1.005–1.030)
pH: 5 (ref 5.0–8.0)

## 2016-06-11 LAB — CBC WITH DIFFERENTIAL/PLATELET
BASOS PCT: 0 %
Basophils Absolute: 0 10*3/uL (ref 0.0–0.1)
EOS PCT: 1 %
Eosinophils Absolute: 0.2 10*3/uL (ref 0.0–0.7)
HEMATOCRIT: 11.1 % — AB (ref 36.0–46.0)
HEMOGLOBIN: 3.4 g/dL — AB (ref 12.0–15.0)
LYMPHS ABS: 2.4 10*3/uL (ref 0.7–4.0)
LYMPHS PCT: 11 %
MCH: 19.7 pg — AB (ref 26.0–34.0)
MCHC: 30.6 g/dL (ref 30.0–36.0)
MCV: 64.2 fL — AB (ref 78.0–100.0)
MONOS PCT: 5 %
Monocytes Absolute: 1.1 10*3/uL — ABNORMAL HIGH (ref 0.1–1.0)
NEUTROS ABS: 18.2 10*3/uL — AB (ref 1.7–7.7)
Neutrophils Relative %: 83 %
Platelets: 195 10*3/uL (ref 150–400)
RBC: 1.73 MIL/uL — ABNORMAL LOW (ref 3.87–5.11)
RDW: 16 % — ABNORMAL HIGH (ref 11.5–15.5)
WBC: 21.9 10*3/uL — ABNORMAL HIGH (ref 4.0–10.5)

## 2016-06-11 LAB — BRAIN NATRIURETIC PEPTIDE: B Natriuretic Peptide: 69.5 pg/mL (ref 0.0–100.0)

## 2016-06-11 LAB — URINE CULTURE: Culture: 50000 — AB

## 2016-06-11 LAB — IRON AND TIBC
IRON: 56 ug/dL (ref 28–170)
Saturation Ratios: 23 % (ref 10.4–31.8)
TIBC: 245 ug/dL — ABNORMAL LOW (ref 250–450)
UIBC: 189 ug/dL

## 2016-06-11 LAB — URINE MICROSCOPIC-ADD ON

## 2016-06-11 LAB — RETICULOCYTES
RBC.: 1.8 MIL/uL — AB (ref 3.87–5.11)
RETIC COUNT ABSOLUTE: 169.2 10*3/uL (ref 19.0–186.0)
RETIC CT PCT: 9.4 % — AB (ref 0.4–3.1)

## 2016-06-11 LAB — I-STAT CG4 LACTIC ACID, ED: Lactic Acid, Venous: 1.36 mmol/L (ref 0.5–1.9)

## 2016-06-11 LAB — VITAMIN B12: Vitamin B-12: 330 pg/mL (ref 180–914)

## 2016-06-11 LAB — PREPARE RBC (CROSSMATCH)

## 2016-06-11 LAB — I-STAT TROPONIN, ED: Troponin i, poc: 0.03 ng/mL (ref 0.00–0.08)

## 2016-06-11 LAB — FERRITIN: FERRITIN: 32 ng/mL (ref 11–307)

## 2016-06-11 LAB — PROTIME-INR
INR: 1.52
PROTHROMBIN TIME: 18.4 s — AB (ref 11.4–15.2)

## 2016-06-11 LAB — POC OCCULT BLOOD, ED: Fecal Occult Bld: POSITIVE — AB

## 2016-06-11 MED ORDER — SODIUM CHLORIDE 0.9 % IV SOLN
80.0000 mg | Freq: Once | INTRAVENOUS | Status: AC
  Administered 2016-06-12: 80 mg via INTRAVENOUS
  Filled 2016-06-11: qty 80

## 2016-06-11 MED ORDER — SODIUM CHLORIDE 0.9 % IV SOLN
Freq: Once | INTRAVENOUS | Status: DC
  Administered 2016-06-12: 02:00:00 via INTRAVENOUS

## 2016-06-11 MED ORDER — SODIUM CHLORIDE 0.9 % IV BOLUS (SEPSIS)
500.0000 mL | Freq: Once | INTRAVENOUS | Status: AC
  Administered 2016-06-11: 500 mL via INTRAVENOUS

## 2016-06-11 MED ORDER — SODIUM CHLORIDE 0.9 % IV SOLN
8.0000 mg/h | INTRAVENOUS | Status: DC
  Administered 2016-06-12 – 2016-06-14 (×6): 8 mg/h via INTRAVENOUS
  Filled 2016-06-11 (×14): qty 80

## 2016-06-11 NOTE — ED Notes (Signed)
Will Danise, PA. Notified of critical Hemaglobin 3.4

## 2016-06-11 NOTE — ED Provider Notes (Signed)
Patient seen and evaluated. Discussed with Will Dansie PA-C.  Shortness of breath and dizziness. Exam shows take resting tachycardia. Conjunctiva pale. Hemoglobin 3. Require admission for blood transfusion. Reports black stool. Guaiac pending. Previous colonoscopy. Previous polypectomy. No history of upper abdominal GI bleeding source. No history of varices no history of ulcer disease gastritis.   Tanna Furry, MD 06/11/16 2241

## 2016-06-11 NOTE — ED Notes (Signed)
Called blood bank and checked on units status. Not ready

## 2016-06-11 NOTE — ED Triage Notes (Signed)
EMS called out for  Altered mental status. Pt unsure why she is here. Pt was desating in the 80 on 10. ls. On NRB. Pt alert and follow commands. Was seen here recently in rehab for broken hip

## 2016-06-11 NOTE — ED Provider Notes (Signed)
[facility] DEPT Provider Note   CSN: [id number] Arrival date & time: 06/11/16  2018     History   Chief Complaint Chief Complaint  Patient presents with  . Altered Mental Status    HPI Tracey Nguyen is a 75 y.o. female.  Level V Caveat: AMS  Tracey Nguyen is a 75 y.o. Female who presents to the ED with her husband from home by EMS and her husband reports that today she's had a change in her mental status. The patient was seen 2 days ago in the emergency department for an elevated heart rate and was determined to have a urinary tract infection. She started on Keflex. She is normally alert and oriented and in today has had some increased confusion. Patient's husband reports that she became increasingly confused this evening. She was not been answering questions appropriately. Patient is on oxygen at home. EMS reports she does not seem to be receiving her part oxygen due to the extremely long length of the tubing at the house. The patient's husband reports on my exam she seems to be breathing much at her now and is responding much better currently. She does not think she's been taking her antibiotics today. Husband currently reports that she is breathing at her baseline now. He denies fevers, vomiting, diarrhea, rashes, or coughing.    The history is provided by the patient, the EMS personnel and medical records.  Altered Mental Status   Associated symptoms include confusion.    Past Medical History:  Diagnosis Date  . Alpha thalassemia minor trait   . Arthritis   . Asthma   . Blood transfusion    after femur surgery  . Cancer [facility]) 2007   breast cancer  . Cataract    BILATERAL -REMOVED VIA SURGERY  . COLONIC POLYPS, HX OF 08/17/2007   Qualifier: Diagnosis of  By: Sherren Mocha MD, Jory Ee   . Complication of anesthesia    hard to wake up in 1970  . GERD (gastroesophageal reflux disease)   . Headache(784.0)    hx of migraines  . History of D&C   . Hypertension    . Osteoporosis    tx with boniva in the past  . Oxygen dependent   . Oxygen dependent   . Pneumonia    hx of  . S/P removal of and implantation of new IM nail, right hip 10/30/2012  . Scoliosis/kyphoscoliosis   . Sleep apnea    on cpap  . Sleep-related hypoventilation due to chest wall disorder     Patient Active Problem List   Diagnosis Date Noted  . Acute upper GI bleed 06/12/2016  . Supplemental oxygen dependent 11/06/2015  . Leg swelling 09/16/2015  . Scoliosis (and kyphoscoliosis), idiopathic 11/06/2014  . OSA on CPAP 11/06/2014  . Chronic respiratory failure ([place]) 11/05/2013  . 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 Restless legs syndrome 04/30/2013  . Obstructive sleep apnea 11/16/2012  . Obesity (BMI 30.0-34.9) 10/31/2012  . PERIPHERAL NEUROPATHY 09/17/2008  . Depression 08/17/2007  . ALLERGIC RHINITIS 08/17/2007  . Extrinsic asthma 08/17/2007  . GERD 08/17/2007  . Essential hypertension 08/17/2007    Past Surgical History:  Procedure Laterality Date  . BREAST BIOPSY  1995   left  . CATARACT EXTRACTION, BILATERAL  2006  . DILATION AND CURETTAGE OF UTERUS    . FEMUR FRACTURE SURGERY  2011   titanium rod/right  . FEMUR IM NAIL Right 10/30/2012  . hemorrhoidectomy  1987  .  HIP SURGERY  10/30/12  . MASTECTOMY  2007   right    OB History    No data available       Home Medications    Prior to Admission medications   Medication Sig Start Date End Date Taking? Authorizing Provider  budesonide-formoterol (SYMBICORT) 160-4.5 MCG/ACT inhaler Inhale 2 puffs into the lungs 2 (two) times daily. 03/22/16  Yes Praveen Mannam, MD  cholecalciferol (VITAMIN D) 1000 UNITS tablet Take 1,000 Units by mouth daily.   Yes Historical Provider, MD  dextromethorphan (DELSYM) 30 MG/5ML liquid Take 60 mg by mouth 2 (two) times daily as needed for cough. 2 tsp by mouth twice daily as needed    Yes Historical Provider, MD  DULoxetine (CYMBALTA) 30 MG  capsule TAKE 1 CAPSULE BY MOUTH ONCE DAILY 01/15/16  Yes Dorothyann Peng, NP  fluticasone (FLONASE) 50 MCG/ACT nasal spray USE 1 SPRAY IN EACH NOSTRIL EVERY DAY 01/19/16  Yes Praveen Mannam, MD  furosemide (LASIX) 40 MG tablet Take 1 tablet (40 mg total) by mouth daily. 12/10/15  Yes Dorothyann Peng, NP  Ipratropium-Albuterol (COMBIVENT RESPIMAT) 20-100 MCG/ACT AERS respimat Inhale 1 puff into the lungs every 6 (six) hours as needed for wheezing. 03/22/16  Yes Praveen Mannam, MD  losartan (COZAAR) 50 MG tablet TAKE 1 TABLET (50 MG TOTAL) BY MOUTH DAILY. Patient taking differently: Take 75 mg by mouth at bedtime. TAKE 1 TABLET (50 MG TOTAL) BY MOUTH DAILY. 12/10/15  Yes Dorothyann Peng, NP  Multiple Vitamins-Minerals (PRESERVISION AREDS PO) Take 1 tablet by mouth 2 (two) times daily.    Yes Historical Provider, MD  polyethylene glycol (MIRALAX / GLYCOLAX) packet Take 17 g by mouth as needed for mild constipation.  11/02/12  Yes Matthew Babish, PA-C  pramipexole (MIRAPEX) 1 MG tablet TAKE 1 & 1/2 TABLETS BY MOUTH ONCE EVERY EVENING 07/23/15  Yes Larey Seat, MD  Probiotic Product (ALIGN) 4 MG CAPS Take 1 capsule by mouth daily.    Yes Historical Provider, MD  vitamin B-12 (CYANOCOBALAMIN) 100 MCG tablet Take 100 mcg by mouth daily.   Yes Historical Provider, MD    Family History Family History  Problem Relation Age of Onset  . Dermatomyositis Mother   . Heart attack Father   . Tuberculosis Father   . Cancer Sister     Cervical ?   . Thalassemia Sister     Social History Social History  Substance Use Topics  . Smoking status: Former Smoker    Packs/day: 0.20    Years: 10.00    Types: Cigarettes    Quit date: 08/09/1964  . Smokeless tobacco: Never Used  . Alcohol use No     Allergies   Augmentin [amoxicillin-pot clavulanate] and Codeine   Review of Systems Review of Systems  Unable to perform ROS: Mental status change  Constitutional: Negative for fever.  Skin: Negative for rash.    Psychiatric/Behavioral: Positive for confusion.     Physical Exam Updated Vital Signs BP (!) 80/46   Pulse 97   Temp 98.2 F (36.8 C) (Oral)   Resp 21   SpO2 100%   Physical Exam  Constitutional: She appears well-developed and well-nourished. No distress.  HENT:  Head: Normocephalic and atraumatic.  Right Ear: External ear normal.  Left Ear: External ear normal.  Mouth/Throat: Oropharynx is clear and moist.  Mucous membranes are moist.  Eyes: Conjunctivae are normal. Pupils are equal, round, and reactive to light. Right eye exhibits no discharge. Left eye exhibits no discharge.  Neck: Neck supple.  Cardiovascular: Normal rate, regular rhythm, normal heart sounds and intact distal pulses.  Exam reveals no gallop and no friction rub.   No murmur heard. Pulmonary/Chest:  Initial respirations of 28 when moved to room. During exam and interview with husband respirations improved to 24 and patient appeared more comfortable. Some increased work of breathing. Husband reports this is her baseline. Diminished in bilateral bases. No wheezing.   Abdominal: Soft. There is no tenderness. There is no guarding.  Genitourinary: Rectal exam shows guaiac positive stool.  Genitourinary Comments: Digital rectal exam with our initial heparin. Dark tarry stool on exam. Guaiac positive.  Musculoskeletal: She exhibits no edema or tenderness.  Wearing splint to left hand. No lower extremity edema or tenderness.  Lymphadenopathy:    She has no cervical adenopathy.  Neurological: She is alert. Coordination normal.  Patient is alert and oriented to person only. Speech is clear and coherent. No facial droop. She is spontaneously moving all her extremities with good strength  Skin: Skin is warm and dry. Capillary refill takes less than 2 seconds. No rash noted. She is not diaphoretic. No erythema. There is pallor.  Psychiatric: She has a normal mood and affect. Her behavior is normal.  Nursing note and  vitals reviewed.    ED Treatments / Results  Labs (all labs ordered are listed, but only abnormal results are displayed) Labs Reviewed  COMPREHENSIVE METABOLIC PANEL - Abnormal; Notable for the following:       Result Value   Glucose, Bld 112 (*)    BUN 116 (*)    Creatinine, Ser 2.39 (*)    Calcium 7.5 (*)    Total Protein 4.1 (*)    Albumin 2.4 (*)    AST 12 (*)    ALT 9 (*)    Alkaline Phosphatase 37 (*)    GFR calc non Af Amer 19 (*)    GFR calc Af Amer 22 (*)    All other components within normal limits  CBC WITH DIFFERENTIAL/PLATELET - Abnormal; Notable for the following:    WBC 21.9 (*)    RBC 1.73 (*)    Hemoglobin 3.4 (*)    HCT 11.1 (*)    MCV 64.2 (*)    MCH 19.7 (*)    RDW 16.0 (*)    Neutro Abs 18.2 (*)    Monocytes Absolute 1.1 (*)    All other components within normal limits  URINALYSIS, ROUTINE W REFLEX MICROSCOPIC (NOT AT [facility]) - Abnormal; Notable for the following:    APPearance CLOUDY (*)    Leukocytes, UA SMALL (*)    All other components within normal limits  IRON AND TIBC - Abnormal; Notable for the following:    TIBC 245 (*)    All other components within normal limits  RETICULOCYTES - Abnormal; Notable for the following:    Retic Ct Pct 9.4 (*)    RBC. 1.80 (*)    All other components within normal limits  URINE MICROSCOPIC-ADD ON - Abnormal; Notable for the following:    Squamous Epithelial / LPF 0-5 (*)    Bacteria, UA RARE (*)    Casts HYALINE CASTS (*)    All other components within normal limits  PROTIME-INR - Abnormal; Notable for the following:    Prothrombin Time 18.4 (*)    All other components within normal limits  POC OCCULT BLOOD, ED - Abnormal; Notable for the following:    Fecal Occult Bld POSITIVE (*)  All other components within normal limits  BRAIN NATRIURETIC PEPTIDE  VITAMIN B12  FOLATE  FERRITIN  HEMOGLOBIN AND HEMATOCRIT, BLOOD  HEMOGLOBIN AND HEMATOCRIT, BLOOD  HEMOGLOBIN AND HEMATOCRIT, BLOOD  CBC  BASIC  METABOLIC PANEL  I-STAT TROPOININ, ED  I-STAT CG4 LACTIC ACID, ED  TYPE AND SCREEN  PREPARE RBC (CROSSMATCH)    EKG  EKG Interpretation None       Radiology Dg Chest Port 1 View  Result Date: 06/11/2016 CLINICAL DATA:  Dyspnea.  Altered mental status. EXAM: PORTABLE CHEST 1 VIEW COMPARISON:  06/09/2016 FINDINGS: There is stable elevation of the left hemidiaphragm. No airspace consolidation. Hilar, mediastinal and cardiac contours are unremarkable and unchanged. No large effusions. IMPRESSION: No acute findings. Electronically Signed   By: Andreas Newport M.D.   On: 06/11/2016 21:04    Procedures Procedures (including critical care time)  Medications Ordered in ED Medications  pantoprazole (PROTONIX) 80 mg in sodium chloride 0.9 % 250 mL (0.32 mg/mL) infusion (8 mg/hr Intravenous New Bag/Given 06/12/16 0041)  0.9 %  sodium chloride infusion (not administered)  arformoterol (BROVANA) nebulizer solution 15 mcg (not administered)  budesonide (PULMICORT) nebulizer solution 0.5 mg (not administered)  ipratropium-albuterol (DUONEB) 0.5-2.5 (3) MG/3ML nebulizer solution 3 mL (not administered)  sodium chloride 0.9 % bolus 500 mL (0 mLs Intravenous Stopped 06/11/16 2216)  pantoprazole (PROTONIX) 80 mg in sodium chloride 0.9 % 100 mL IVPB (0 mg Intravenous Stopped 06/12/16 0034)     Initial Impression / Assessment and Plan / ED Course  I have reviewed the triage vital signs and the nursing notes.  Pertinent labs & imaging results that were available during my care of the patient were reviewed by me and considered in my medical decision making (see chart for details).  Clinical Course   This  is a 75 y.o. Female who presents to the ED with her husband from home by EMS and her husband reports that today she's had a change in her mental status. The patient was seen 2 days ago in the emergency department for an elevated heart rate and was determined to have a urinary tract infection. She  started on Keflex. She is normally alert and oriented and in today has had some increased confusion. Patient's husband reports that she became increasingly confused this evening. She was not been answering questions appropriately. Patient is on oxygen at home. EMS reports she does not seem to be receiving her part oxygen due to the extremely long length of the tubing at the house. The patient's husband reports on my exam she seems to be breathing much at her now and is responding much better currently. She does not think she's been taking her antibiotics today.   Initially on exam the patient seemed to be working hard to breathe. This improved with oxygen. Her mental status seemed to improve. She is oriented to person only on exam. Lungs are clear to auscultation bilaterally. She is pale in appearance. CBC returned with a hemoglobin of 3.4. This is a significant decrease from her hemoglobin 9.92 days ago. She is a leukocytosis with a white count of 21,000. CMP reveals an elevated creatinine of 2.39 which is an increase from her baseline.  At further investigation the patient's husband reports he noticed she's had work stools since Wednesday. He reports he believes they were from the antibiotic that she was started on. No vomiting. Naproxen is on her med list. Husband is unsure if she is taking this.   Plan is to  transfuse the patient. Patient's husband agrees. I discussed the risks with blood transfusion.  Patient is guaiac positive with dark tarry stool on digital rectal exam.  Chest x-ray is unremarkable.  Patient started with blood transfusion. Protonix drip started.  I consulted with gastroenterologist Dr. Michail Sermon who agreed with plan so far and will see the patient in the AM. Admit to medicine.   I consulted with Dr. Alcario Drought from [facility] who would like CCM consulted.   I consulted with critical care medicine who came down to evaluate the patient and agrees to take the patient to ICU.  This  patient was discussed with and evaluated by Dr. Jeneen Rinks who agrees with assessment and plan.   CRITICAL CARE Performed by: Hanley Hays   Total critical care time: 45 minutes  Critical care time was exclusive of separately billable procedures and treating other patients.  Critical care was necessary to treat or prevent imminent or life-threatening deterioration.  Critical care was time spent personally by me on the following activities: development of treatment plan with patient and/or surrogate as well as nursing, discussions with consultants, evaluation of patient's response to treatment, examination of patient, obtaining history from patient or surrogate, ordering and performing treatments and interventions, ordering and review of laboratory studies, ordering and review of radiographic studies, pulse oximetry and re-evaluation of patient's condition.  Final Clinical Impressions(s) / ED Diagnoses   Final diagnoses:  Symptomatic anemia  Upper GI bleed    New Prescriptions Current Discharge Medication List       Waynetta Pean, PA-C 06/12/16 0159    Tanna Furry, MD 07/05/16 2314

## 2016-06-11 NOTE — Progress Notes (Signed)
RT checked on pt. with chart reviewed, placed to [facility] @ 6L/35%fi02 to keep sats>95%, removed NRB mask/15 lpm from 06/09/2017 ABG results, uses CPAP @ home h/s, has BiPAP order active if needed for ED use, pt. tolerating current therapy, RT to monitor.

## 2016-06-12 ENCOUNTER — Encounter (HOSPITAL_COMMUNITY)
Admission: EM | Disposition: A | Discharge status: Other Acute Inpatient Hospital | Payer: Self-pay | Source: Home / Self Care | Attending: Internal Medicine

## 2016-06-12 ENCOUNTER — Inpatient Hospital Stay (HOSPITAL_COMMUNITY): Payer: Medicare Other

## 2016-06-12 ENCOUNTER — Telehealth (HOSPITAL_BASED_OUTPATIENT_CLINIC_OR_DEPARTMENT_OTHER): Payer: Self-pay

## 2016-06-12 DIAGNOSIS — Z9010 Acquired absence of unspecified breast and nipple: Secondary | ICD-10-CM | POA: Diagnosis not present

## 2016-06-12 DIAGNOSIS — R000 Tachycardia, unspecified: Secondary | ICD-10-CM | POA: Diagnosis present

## 2016-06-12 DIAGNOSIS — K922 Gastrointestinal hemorrhage, unspecified: Secondary | ICD-10-CM | POA: Diagnosis present

## 2016-06-12 DIAGNOSIS — J9601 Acute respiratory failure with hypoxia: Secondary | ICD-10-CM | POA: Diagnosis not present

## 2016-06-12 DIAGNOSIS — Z86010 Personal history of colonic polyps: Secondary | ICD-10-CM | POA: Diagnosis not present

## 2016-06-12 DIAGNOSIS — J811 Chronic pulmonary edema: Secondary | ICD-10-CM | POA: Diagnosis present

## 2016-06-12 DIAGNOSIS — K210 Gastro-esophageal reflux disease with esophagitis: Secondary | ICD-10-CM | POA: Diagnosis present

## 2016-06-12 DIAGNOSIS — Z9842 Cataract extraction status, left eye: Secondary | ICD-10-CM | POA: Diagnosis not present

## 2016-06-12 DIAGNOSIS — R571 Hypovolemic shock: Secondary | ICD-10-CM | POA: Diagnosis present

## 2016-06-12 DIAGNOSIS — J988 Other specified respiratory disorders: Secondary | ICD-10-CM | POA: Diagnosis not present

## 2016-06-12 DIAGNOSIS — Z886 Allergy status to analgesic agent status: Secondary | ICD-10-CM | POA: Diagnosis not present

## 2016-06-12 DIAGNOSIS — R578 Other shock: Secondary | ICD-10-CM | POA: Diagnosis present

## 2016-06-12 DIAGNOSIS — Z79899 Other long term (current) drug therapy: Secondary | ICD-10-CM | POA: Diagnosis not present

## 2016-06-12 DIAGNOSIS — J9811 Atelectasis: Secondary | ICD-10-CM | POA: Diagnosis present

## 2016-06-12 DIAGNOSIS — M1990 Unspecified osteoarthritis, unspecified site: Secondary | ICD-10-CM | POA: Diagnosis present

## 2016-06-12 DIAGNOSIS — I10 Essential (primary) hypertension: Secondary | ICD-10-CM | POA: Diagnosis present

## 2016-06-12 DIAGNOSIS — K921 Melena: Secondary | ICD-10-CM | POA: Diagnosis present

## 2016-06-12 DIAGNOSIS — R579 Shock, unspecified: Secondary | ICD-10-CM | POA: Diagnosis not present

## 2016-06-12 DIAGNOSIS — Z9841 Cataract extraction status, right eye: Secondary | ICD-10-CM | POA: Diagnosis not present

## 2016-06-12 DIAGNOSIS — Z9981 Dependence on supplemental oxygen: Secondary | ICD-10-CM | POA: Diagnosis not present

## 2016-06-12 DIAGNOSIS — J45909 Unspecified asthma, uncomplicated: Secondary | ICD-10-CM | POA: Diagnosis present

## 2016-06-12 DIAGNOSIS — D62 Acute posthemorrhagic anemia: Secondary | ICD-10-CM | POA: Diagnosis present

## 2016-06-12 DIAGNOSIS — E869 Volume depletion, unspecified: Secondary | ICD-10-CM | POA: Diagnosis present

## 2016-06-12 DIAGNOSIS — K269 Duodenal ulcer, unspecified as acute or chronic, without hemorrhage or perforation: Secondary | ICD-10-CM | POA: Diagnosis present

## 2016-06-12 DIAGNOSIS — Z809 Family history of malignant neoplasm, unspecified: Secondary | ICD-10-CM | POA: Diagnosis not present

## 2016-06-12 DIAGNOSIS — J9621 Acute and chronic respiratory failure with hypoxia: Secondary | ICD-10-CM | POA: Diagnosis present

## 2016-06-12 DIAGNOSIS — Z7951 Long term (current) use of inhaled steroids: Secondary | ICD-10-CM | POA: Diagnosis not present

## 2016-06-12 DIAGNOSIS — D649 Anemia, unspecified: Secondary | ICD-10-CM | POA: Diagnosis not present

## 2016-06-12 DIAGNOSIS — E870 Hyperosmolality and hypernatremia: Secondary | ICD-10-CM | POA: Diagnosis present

## 2016-06-12 DIAGNOSIS — Z01818 Encounter for other preprocedural examination: Secondary | ICD-10-CM | POA: Diagnosis not present

## 2016-06-12 DIAGNOSIS — N179 Acute kidney failure, unspecified: Secondary | ICD-10-CM | POA: Diagnosis present

## 2016-06-12 HISTORY — PX: ESOPHAGOGASTRODUODENOSCOPY: SHX5428

## 2016-06-12 LAB — HEMOGLOBIN AND HEMATOCRIT, BLOOD
HCT: 24.6 % — ABNORMAL LOW (ref 36.0–46.0)
HCT: 25.2 % — ABNORMAL LOW (ref 36.0–46.0)
HEMATOCRIT: 20.2 % — AB (ref 36.0–46.0)
HEMATOCRIT: 24.8 % — AB (ref 36.0–46.0)
HEMOGLOBIN: 6.9 g/dL — AB (ref 12.0–15.0)
HEMOGLOBIN: 8.4 g/dL — AB (ref 12.0–15.0)
HEMOGLOBIN: 8.2 g/dL — AB (ref 12.0–15.0)
Hemoglobin: 8.2 g/dL — ABNORMAL LOW (ref 12.0–15.0)

## 2016-06-12 LAB — BASIC METABOLIC PANEL
Anion gap: 9 (ref 5–15)
BUN: 114 mg/dL — AB (ref 6–20)
CALCIUM: 8.3 mg/dL — AB (ref 8.9–10.3)
CHLORIDE: 108 mmol/L (ref 101–111)
CO2: 23 mmol/L (ref 22–32)
CREATININE: 2.37 mg/dL — AB (ref 0.44–1.00)
GFR calc non Af Amer: 19 mL/min — ABNORMAL LOW (ref >60)
GFR, EST AFRICAN AMERICAN: 22 mL/min — AB (ref >60)
GLUCOSE: 117 mg/dL — AB (ref 65–99)
Potassium: 3.9 mmol/L (ref 3.5–5.1)
Sodium: 140 mmol/L (ref 135–145)

## 2016-06-12 LAB — GLUCOSE, CAPILLARY
GLUCOSE-CAPILLARY: 113 mg/dL — AB (ref 65–99)
Glucose-Capillary: 130 mg/dL — ABNORMAL HIGH (ref 65–99)

## 2016-06-12 LAB — CBC
HEMATOCRIT: 21 % — AB (ref 36.0–46.0)
HEMOGLOBIN: 7 g/dL — AB (ref 12.0–15.0)
MCH: 24.1 pg — AB (ref 26.0–34.0)
MCHC: 33.3 g/dL (ref 30.0–36.0)
MCV: 72.2 fL — AB (ref 78.0–100.0)
PLATELETS: 180 10*3/uL (ref 150–400)
RBC: 2.91 MIL/uL — AB (ref 3.87–5.11)
RDW: 20.8 % — ABNORMAL HIGH (ref 11.5–15.5)
WBC: 19 10*3/uL — AB (ref 4.0–10.5)

## 2016-06-12 LAB — POCT I-STAT 3, ART BLOOD GAS (G3+)
Acid-base deficit: 1 mmol/L (ref 0.0–2.0)
Bicarbonate: 23.9 mmol/L (ref 20.0–28.0)
O2 Saturation: 99 %
PCO2 ART: 37.5 mmHg (ref 32.0–48.0)
PH ART: 7.412 (ref 7.350–7.450)
TCO2: 25 mmol/L (ref 0–100)
pO2, Arterial: 118 mmHg — ABNORMAL HIGH (ref 83.0–108.0)

## 2016-06-12 LAB — TROPONIN I
TROPONIN I: 0.06 ng/mL — AB (ref <0.03)
TROPONIN I: 0.07 ng/mL — AB (ref <0.03)
Troponin I: 0.06 ng/mL (ref <0.03)

## 2016-06-12 LAB — FOLATE: FOLATE: 13.4 ng/mL (ref >5.9)

## 2016-06-12 LAB — TRIGLYCERIDES: Triglycerides: 221 mg/dL — ABNORMAL HIGH (ref <150)

## 2016-06-12 LAB — PREPARE RBC (CROSSMATCH)

## 2016-06-12 LAB — MRSA PCR SCREENING: MRSA by PCR: NEGATIVE

## 2016-06-12 SURGERY — EGD (ESOPHAGOGASTRODUODENOSCOPY)
Anesthesia: Moderate Sedation

## 2016-06-12 MED ORDER — MIDAZOLAM HCL 2 MG/2ML IJ SOLN
INTRAMUSCULAR | Status: AC
Start: 2016-06-12 — End: 2016-06-12
  Filled 2016-06-12: qty 2

## 2016-06-12 MED ORDER — SODIUM CHLORIDE 0.9 % IV SOLN
INTRAVENOUS | Status: DC
  Administered 2016-06-12 (×2): via INTRAVENOUS
  Administered 2016-06-13: 75 mL/h via INTRAVENOUS
  Administered 2016-06-13 – 2016-06-17 (×2): via INTRAVENOUS

## 2016-06-12 MED ORDER — CHLORHEXIDINE GLUCONATE 0.12 % MT SOLN
15.0000 mL | Freq: Two times a day (BID) | OROMUCOSAL | Status: DC
  Administered 2016-06-12: 15 mL via OROMUCOSAL

## 2016-06-12 MED ORDER — ARFORMOTEROL TARTRATE 15 MCG/2ML IN NEBU
15.0000 ug | INHALATION_SOLUTION | Freq: Two times a day (BID) | RESPIRATORY_TRACT | Status: DC
  Administered 2016-06-12 – 2016-06-17 (×11): 15 ug via RESPIRATORY_TRACT
  Filled 2016-06-12 (×17): qty 2

## 2016-06-12 MED ORDER — ORAL CARE MOUTH RINSE
15.0000 mL | Freq: Four times a day (QID) | OROMUCOSAL | Status: DC
  Administered 2016-06-13 (×2): 15 mL via OROMUCOSAL

## 2016-06-12 MED ORDER — SODIUM CHLORIDE 0.9 % IV SOLN
Freq: Once | INTRAVENOUS | Status: AC
  Administered 2016-06-12: 11:00:00 via INTRAVENOUS

## 2016-06-12 MED ORDER — ETOMIDATE 2 MG/ML IV SOLN
20.0000 mg | Freq: Once | INTRAVENOUS | Status: AC
  Administered 2016-06-12: 20 mg via INTRAVENOUS

## 2016-06-12 MED ORDER — FENTANYL CITRATE (PF) 100 MCG/2ML IJ SOLN
50.0000 ug | Freq: Once | INTRAMUSCULAR | Status: AC
  Administered 2016-06-12: 50 ug via INTRAVENOUS

## 2016-06-12 MED ORDER — MIDAZOLAM HCL 10 MG/2ML IJ SOLN
INTRAMUSCULAR | Status: DC | PRN
  Administered 2016-06-12: 1 mg via INTRAVENOUS

## 2016-06-12 MED ORDER — PROPOFOL 1000 MG/100ML IV EMUL
0.0000 ug/kg/min | INTRAVENOUS | Status: DC
  Administered 2016-06-12 – 2016-06-13 (×3): 10 ug/kg/min via INTRAVENOUS
  Filled 2016-06-12 (×2): qty 100

## 2016-06-12 MED ORDER — PHENYLEPHRINE HCL 10 MG/ML IJ SOLN
0.0000 ug/min | INTRAVENOUS | Status: DC
  Administered 2016-06-12: 40 ug/min via INTRAVENOUS
  Administered 2016-06-12: 20 ug/min via INTRAVENOUS
  Administered 2016-06-13: 30 ug/min via INTRAVENOUS
  Administered 2016-06-13: 20 ug/min via INTRAVENOUS
  Administered 2016-06-13: 40 ug/min via INTRAVENOUS
  Administered 2016-06-14: 20 ug/min via INTRAVENOUS
  Administered 2016-06-14: 50 ug/min via INTRAVENOUS
  Administered 2016-06-15: 25 ug/min via INTRAVENOUS
  Filled 2016-06-12 (×9): qty 1

## 2016-06-12 MED ORDER — FENTANYL CITRATE (PF) 100 MCG/2ML IJ SOLN
INTRAMUSCULAR | Status: AC
  Filled 2016-06-12: qty 2

## 2016-06-12 MED ORDER — MIDAZOLAM HCL 5 MG/ML IJ SOLN
INTRAMUSCULAR | Status: AC
  Filled 2016-06-12: qty 2

## 2016-06-12 MED ORDER — FENTANYL CITRATE (PF) 100 MCG/2ML IJ SOLN
50.0000 ug | INTRAMUSCULAR | Status: DC | PRN
  Administered 2016-06-13 – 2016-06-14 (×2): 50 ug via INTRAVENOUS
  Filled 2016-06-12: qty 2

## 2016-06-12 MED ORDER — FENTANYL CITRATE (PF) 100 MCG/2ML IJ SOLN: 50.0000 ug | INTRAMUSCULAR | Status: DC | PRN

## 2016-06-12 MED ORDER — IPRATROPIUM-ALBUTEROL 0.5-2.5 (3) MG/3ML IN SOLN
3.0000 mL | RESPIRATORY_TRACT | Status: DC | PRN
  Administered 2016-06-16 – 2016-06-18 (×2): 3 mL via RESPIRATORY_TRACT
  Filled 2016-06-12 (×2): qty 3

## 2016-06-12 MED ORDER — MIDAZOLAM HCL 2 MG/2ML IJ SOLN
1.0000 mg | Freq: Once | INTRAMUSCULAR | Status: AC
  Administered 2016-06-12: 1 mg via INTRAVENOUS

## 2016-06-12 MED ORDER — SODIUM CHLORIDE 0.9 % IV SOLN
INTRAVENOUS | Status: DC
  Administered 2016-06-12: 13:00:00 via INTRAVENOUS

## 2016-06-12 MED ORDER — BUDESONIDE 0.5 MG/2ML IN SUSP
0.5000 mg | Freq: Two times a day (BID) | RESPIRATORY_TRACT | Status: DC
  Administered 2016-06-12 – 2016-06-18 (×13): 0.5 mg via RESPIRATORY_TRACT
  Filled 2016-06-12 (×14): qty 2

## 2016-06-12 MED ORDER — ORAL CARE MOUTH RINSE
15.0000 mL | Freq: Two times a day (BID) | OROMUCOSAL | Status: DC
  Administered 2016-06-12 (×2): 15 mL via OROMUCOSAL

## 2016-06-12 MED ORDER — ROCURONIUM BROMIDE 50 MG/5ML IV SOLN
60.0000 mg | Freq: Once | INTRAVENOUS | Status: AC
  Administered 2016-06-12: 60 mg via INTRAVENOUS
  Filled 2016-06-12: qty 6

## 2016-06-12 MED ORDER — CHLORHEXIDINE GLUCONATE 0.12% ORAL RINSE (MEDLINE KIT)
15.0000 mL | Freq: Two times a day (BID) | OROMUCOSAL | Status: DC
  Administered 2016-06-12 – 2016-06-13 (×3): 15 mL via OROMUCOSAL

## 2016-06-12 NOTE — Progress Notes (Signed)
CRITICAL VALUE ALERT  Critical value received: Troponin 0.06  Date of notification: 06/12/2016  Time of notification:  0950  Critical value read back: Yes  Nurse who received alert:  Lonn Georgia RN  MD notified (1st page):    Time of first page:   MD notified (2nd page):  Time of second page:  Responding MD: Chase Caller  Time MD responded:  [phone]

## 2016-06-12 NOTE — ED Notes (Signed)
Attempted to call report

## 2016-06-12 NOTE — Procedures (Signed)
Intubation Procedure Note Tracey Nguyen [id number] [birth year]  Procedure: Intubation Indications: Airway protection and maintenance  Procedure Details Consent: Risks of procedure as well as the alternatives and risks of each were explained to the (patient/caregiver).  Consent for procedure obtained. Time Out: Verified patient identification, verified procedure, site/side was marked, verified correct patient position, special equipment/implants available, medications/allergies/relevent history reviewed, required imaging and test results available.  Performed  Maximum sterile technique was used including antiseptics, cap, gloves, hand hygiene and mask.  MAC and 3  Glide scope 7.5 tube Could only see false cords.   Evaluation Hemodynamic Status: BP stable throughout; O2 sats: transiently fell during during procedure Patient's Current Condition: stable Complications: No apparent complications Patient did tolerate procedure well. Chest X-ray ordered to verify placement.  CXR: pending.   Tracey Nguyen 06/12/2016

## 2016-06-12 NOTE — Interval H&P Note (Signed)
History and Physical Interval Note:  06/12/2016 2:35 PM  Tracey Nguyen  has presented today for surgery, with the diagnosis of GI Bleed  The various methods of treatment have been discussed with the patient and family. After consideration of risks, benefits and other options for treatment, the patient has consented to  Procedure(s): ESOPHAGOGASTRODUODENOSCOPY (EGD) (N/A) as a surgical intervention .  The patient's history has been reviewed, patient examined, no change in status, stable for surgery.  I have reviewed the patient's chart and labs.  Questions were answered to the patient's satisfaction.     [place] C.

## 2016-06-12 NOTE — Progress Notes (Signed)
CRITICAL VALUE ALERT  Critical value received: Hgb 6.9  Date of notification:  06/12/2016  Time of notification:  [id number]  Critical value read back: Yes  Nurse who received alert:  Lonn Georgia, RN  MD notified (1st page):    Time of first page:   MD notified (2nd page):  Time of second page:  Responding MD: Chase Caller  Time MD responded:  1005

## 2016-06-12 NOTE — Consult Note (Signed)
Referring Provider: Dr. Chase Caller Primary Care Physician:  Dorothyann Peng, NP Primary Gastroenterologist:  Althia Forts  Reason for Consultation:  GI bleed; Anemia  HPI: Tracey Nguyen is a 75 y.o. female seen for a consult due to black stools and anemia. Severe weakness for the past few days and Hgb 3.4 (9.4 on 06/09/16). Black stools have been occurring this week. Denies hematemesis, nausea, or vomiting. Has been on Aleve recently. Denies abdominal pain. Hgb 7 (s/p 2 U PRBCs). No family at bedside.    Past Medical History:  Diagnosis Date  . Alpha thalassemia minor trait   . Arthritis   . Asthma   . Blood transfusion    after femur surgery  . Cancer [facility]) 2007   breast cancer  . Cataract    BILATERAL -REMOVED VIA SURGERY  . COLONIC POLYPS, HX OF 08/17/2007   Qualifier: Diagnosis of  By: Sherren Mocha MD, Jory Ee   . Complication of anesthesia    hard to wake up in 1970  . GERD (gastroesophageal reflux disease)   . Headache(784.0)    hx of migraines  . History of D&C   . Hypertension   . Osteoporosis    tx with boniva in the past  . Oxygen dependent   . Oxygen dependent   . Pneumonia    hx of  . S/P removal of and implantation of new IM nail, right hip 10/30/2012  . Scoliosis/kyphoscoliosis   . Sleep apnea    on cpap  . 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 BREAST BIOPSY  1995   left  . CATARACT EXTRACTION, BILATERAL  2006  . DILATION AND CURETTAGE OF UTERUS    . FEMUR FRACTURE SURGERY  2011   titanium rod/right  . FEMUR IM NAIL Right 10/30/2012  . hemorrhoidectomy  1987  . HIP SURGERY  10/30/12  . MASTECTOMY  2007   right    Prior to Admission medications   Medication Sig Start Date End Date Taking? Authorizing Provider  budesonide-formoterol (SYMBICORT) 160-4.5 MCG/ACT inhaler Inhale 2 puffs into the lungs 2 (two) times daily. 03/22/16  Yes Praveen Mannam, MD  cholecalciferol (VITAMIN  D) 1000 UNITS tablet Take 1,000 Units by mouth daily.   Yes Historical Provider, MD  dextromethorphan (DELSYM) 30 MG/5ML liquid Take 60 mg by mouth 2 (two) times daily as needed for cough. 2 tsp by mouth twice daily as needed    Yes Historical Provider, MD  DULoxetine (CYMBALTA) 30 MG capsule TAKE 1 CAPSULE BY MOUTH ONCE DAILY 01/15/16  Yes Dorothyann Peng, NP  fluticasone (FLONASE) 50 MCG/ACT nasal spray USE 1 SPRAY IN EACH NOSTRIL EVERY DAY 01/19/16  Yes Praveen Mannam, MD  furosemide (LASIX) 40 MG tablet Take 1 tablet (40 mg total) by mouth daily. 12/10/15  Yes Dorothyann Peng, NP  Ipratropium-Albuterol (COMBIVENT RESPIMAT) 20-100 MCG/ACT AERS respimat Inhale 1 puff into the lungs every 6 (six) hours as needed for wheezing. 03/22/16  Yes Praveen Mannam, MD  losartan (COZAAR) 50 MG tablet TAKE 1 TABLET (50 MG TOTAL) BY MOUTH DAILY. Patient taking differently: Take 75 mg by mouth at bedtime. TAKE 1 TABLET (50 MG TOTAL) BY MOUTH DAILY. 12/10/15  Yes Dorothyann Peng, NP  Multiple Vitamins-Minerals (PRESERVISION AREDS PO) Take 1 tablet by mouth 2 (two) times daily.    Yes Historical Provider, MD  polyethylene glycol (MIRALAX / GLYCOLAX) packet Take 17 g by mouth as needed for mild constipation.  11/02/12  Yes Matthew Babish, PA-C  pramipexole (MIRAPEX) 1 MG tablet TAKE 1 & 1/2 TABLETS BY MOUTH ONCE EVERY EVENING 07/23/15  Yes Larey Seat, MD  Probiotic Product (ALIGN) 4 MG CAPS Take 1 capsule by mouth daily.    Yes Historical Provider, MD  vitamin B-12 (CYANOCOBALAMIN) 100 MCG tablet Take 100 mcg by mouth daily.   Yes Historical Provider, MD    Scheduled Meds: . arformoterol  15 mcg Nebulization BID  . budesonide (PULMICORT) nebulizer solution  0.5 mg Nebulization BID  . chlorhexidine  15 mL Mouth Rinse BID  . mouth rinse  15 mL Mouth Rinse q12n4p   Continuous Infusions: . sodium chloride 75 mL/hr at 06/12/16 1200  . sodium chloride 20 mL/hr at 06/12/16 1250  . pantoprozole (PROTONIX) infusion 8 mg/hr  (06/12/16 1200)  . phenylephrine (NEO-SYNEPHRINE) Adult infusion 40 mcg/min (06/12/16 1245)  . propofol (DIPRIVAN) infusion     PRN Meds:.fentaNYL (SUBLIMAZE) injection, fentaNYL (SUBLIMAZE) injection, ipratropium-albuterol  Allergies as of 06/11/2016 - Review Complete 06/11/2016  Allergen Reaction Noted  . Augmentin [amoxicillin-pot clavulanate] Diarrhea 08/16/2014  . Codeine Other (See Comments) 05/25/2007    Family History  Problem Relation Age of Onset  . Dermatomyositis Mother   . Heart attack Father   . Tuberculosis Father   . Cancer Sister     Cervical ?   . Thalassemia Sister     Social History   Social History  . Marital status: Married    Spouse name: [name]  . Number of children: 1  . Years of education: 22   Occupational History  .  Retired   Social History Main Topics  . Smoking status: Former Smoker    Packs/day: 0.20    Years: 10.00    Types: Cigarettes    Quit date: 08/09/1964  . Smokeless tobacco: Never Used  . Alcohol use No  . Drug use: No  . Sexual activity: No   Other Topics Concern  . Not on file   Social History Narrative   patient needs to be seen face to face for oxygen qualification, RR is 16 , min. mallompati 4 and co2 retention from scoliosis overlap with severe OSA - AHI was 87 at baseline. titrated to 13 cm water CPAP, AHC to follow.   Patient is married [name]) and lives at home with her husband.   Patient has one child.   Patient is retired.   Patient has a high school education.   Patient is right-handed.   Patient drinks very little caffeine.              Review of Systems: All negative except as stated above in HPI.  Physical Exam: Vital signs: Vitals:   06/12/16 1220 06/12/16 1227  BP: (!) 93/57 (!) 93/57  Pulse: 81 81  Resp: 19 19  Temp:  99.1 F (37.3 C)   Last BM Date:  (PTA) General:   Lethargic, elderly, Well-developed, well-nourished, pleasant and cooperative, mild acute distress HEENT: anicteric  sclera, oropharynx clear Neck: supple, nontender Lungs:  Clear throughout to auscultation.   No wheezes, crackles, or rhonchi. No acute distress. Heart:  Regular rate and rhythm; no murmurs, clicks, rubs,  or gallops. Abdomen: RUQ and epigastric tenderness with guarding, soft, nondistended, +BS  Rectal:  Deferred Ext: no edema  GI:  Lab Results:  Recent Labs  06/09/16 1905 06/11/16 2053 06/12/16 0711 06/12/16 0900  WBC 13.7* 21.9* 19.0*  --   HGB 9.4* 3.4* 7.0* 6.9*  HCT 31.7*  11.1* 21.0* 20.2*  PLT 244 195 180  --    BMET  Recent Labs  06/09/16 1905 06/11/16 2053 06/12/16 0711  NA 142 140 140  K 4.3 3.5 3.9  CL 101 109 108  CO2 30 22 23   GLUCOSE 161* 112* 117*  BUN 71* 116* 114*  CREATININE 1.36* 2.39* 2.37*  CALCIUM 10.0 7.5* 8.3*   LFT  Recent Labs  06/11/16 2053  PROT 4.1*  ALBUMIN 2.4*  AST 12*  ALT 9*  ALKPHOS 37*  BILITOT 0.4   PT/INR  Recent Labs  06/11/16 2053  LABPROT 18.4*  INR 1.52     Studies/Results: Dg Chest Port 1 View  Result Date: 06/11/2016 CLINICAL DATA:  Dyspnea.  Altered mental status. EXAM: PORTABLE CHEST 1 VIEW COMPARISON:  06/09/2016 FINDINGS: There is stable elevation of the left hemidiaphragm. No airspace consolidation. Hilar, mediastinal and cardiac contours are unremarkable and unchanged. No large effusions. IMPRESSION: No acute findings. Electronically Signed   By: Andreas Newport M.D.   On: 06/11/2016 21:04    Impression/Plan: 75 yo with multiple medical problems who has been having black stools and severe anemia of Hgb 3.4 in need of an EGD to look for peptic ulcer disease. Continue Protonix drip. NPO. Will need intubation prior to EGD for airway protection and ventilation support prior to sedation due to her severe OSA and restrictive lung disease. Discussed risks/benefits of EGD with pt and she agrees to proceed.    LOS: 0 days   [place] C.  06/12/2016, 12:52 PM  Pager [phone]  If no answer or  after 5 PM call [phone]

## 2016-06-12 NOTE — H&P (Signed)
PULMONARY / CRITICAL CARE MEDICINE   Name: Tracey Nguyen MRN: [id number] DOB: 03-05-1941    ADMISSION DATE:  06/11/2016  REFERRING MD:  Dr. Jeneen Rinks, ER  CHIEF COMPLAINT:  Weak  HISTORY OF PRESENT ILLNESS:   75 yo female started feeling weak over the past few days.  She was noted to be tachycardic at rehab.  She called pulmonary office and advised to go to ER for assessment.  She had mild anemia and there was concern for UTI.  She was recently switched from tylenol to [facility] for pain control. Her husband reports that on 11/02 she looked "white as a ghost".  She was also having black stool, but he thought his was from her antibiotic.  She became progressively weaker, and he brought her back to the ER.  She was found to have severe anemia.  She is being given PRBC, and reports symptomatic improvement with transfusion.  She also has been started on protonix gtt, and GI has been called by EDP.  She denies alcohol use or smoking.  She is not aware of stomach ulcers before.  She is not having chest pain or abdominal pain.  She does feel bloated, and reports last BM was on 11/03.  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  PAST SURGICAL HISTORY: She  has a past surgical history that includes Femur fracture surgery (2011); Mastectomy (2007); hemorrhoidectomy (1987); Breast biopsy (1995); Cataract extraction, bilateral (2006); Dilation and curettage of uterus; Femur IM nail (Right, 10/30/2012); and Hip surgery  (10/30/12).  Allergies  Allergen Reactions  . Augmentin [Amoxicillin-Pot Clavulanate] Diarrhea  . Codeine Other (See Comments)    altered mental status    No current facility-administered medications on file prior to encounter.    Current Outpatient Prescriptions on File Prior to Encounter  Medication Sig  . budesonide-formoterol (SYMBICORT) 160-4.5 MCG/ACT inhaler Inhale 2 puffs into the lungs 2 (two) times daily.  . cephALEXin (KEFLEX) 500 MG capsule Take 1 capsule (500 mg total) by mouth 4 (four) times daily.  . cholecalciferol (VITAMIN D) 1000 UNITS tablet Take 1,000 Units by mouth daily.  Marland Kitchen dextromethorphan (DELSYM) 30 MG/5ML liquid Take 60 mg by mouth 2 (two) times daily as needed for cough. 2 tsp by mouth twice daily as needed   . DULoxetine (CYMBALTA) 30 MG capsule TAKE 1 CAPSULE BY MOUTH ONCE DAILY  . fluticasone (FLONASE) 50 MCG/ACT nasal spray USE 1 SPRAY IN EACH NOSTRIL EVERY DAY  . furosemide (LASIX) 40 MG tablet Take 1 tablet (40 mg total) by mouth daily.  . Ipratropium-Albuterol (COMBIVENT RESPIMAT) 20-100 MCG/ACT AERS respimat Inhale 1 puff into the lungs every 6 (six) hours as needed for wheezing.  Marland Kitchen losartan (COZAAR) 50 MG tablet TAKE 1 TABLET (50 MG TOTAL) BY MOUTH DAILY. (Patient taking differently: Take 75 mg by mouth at bedtime. TAKE 1 TABLET (50 MG TOTAL) BY MOUTH DAILY.)  . Multiple Vitamins-Minerals (PRESERVISION AREDS PO) Take 1 tablet by mouth 2 (two) times daily.   . polyethylene glycol (MIRALAX / GLYCOLAX) packet Take 17 g by mouth as needed for mild constipation.   . pramipexole (MIRAPEX) 1 MG  tablet TAKE 1 & 1/2 TABLETS BY MOUTH ONCE EVERY EVENING  . Probiotic Product (ALIGN) 4 MG CAPS Take 1 capsule by mouth daily.   . vitamin B-12 (CYANOCOBALAMIN) 100 MCG tablet Take 100 mcg by mouth daily.  Marland Kitchen doxycycline (VIBRA-TABS) 100 MG tablet Take 1 tablet (100 mg total) by mouth 2 (two) times daily. (Patient not taking: Reported on 06/11/2016)  . predniSONE  (DELTASONE) 10 MG tablet Take 40mg  x 2 days, then 30mg  x 2 days, then 20mg  x 2 days, then 10mg  x 2 days. (Patient not taking: Reported on 06/11/2016)    FAMILY HISTORY:  Her indicated that her mother is deceased. She indicated that her father is deceased.    SOCIAL HISTORY: She  reports that she quit smoking about 51 years ago. Her smoking use included Cigarettes. She has a 2.00 pack-year smoking history. She has never used smokeless tobacco. She reports that she does not drink alcohol or use drugs.  REVIEW OF SYSTEMS:   Negative except above  SUBJECTIVE:   VITAL SIGNS: BP (!) 100/53   Pulse (!) 51   Temp 98.2 F (36.8 C) (Oral)   Resp 16   SpO2 (!) 70%   HEMODYNAMICS:    VENTILATOR SETTINGS: FiO2 (%):  [35 %] 35 %  INTAKE / OUTPUT: No intake/output data recorded.  PHYSICAL EXAMINATION: General: pale Neuro:  Alert, slow verbal responses, follows commands HEENT:  No stridor, no lan Cardiovascular:  Regular, no murmur Lungs:  No wheeze/rales Abdomen:  Mild distention, increased tympany, non tender Musculoskeletal:  No edema, Lt wrist in wrap Skin:  No rashes  LABS:  BMET  Recent Labs Lab 06/09/16 1905 06/11/16 2053  NA 142 140  K 4.3 3.5  CL 101 109  CO2 30 22  BUN 71* 116*  CREATININE 1.36* 2.39*  GLUCOSE 161* 112*    Electrolytes  Recent Labs Lab 06/09/16 1905 06/11/16 2053  CALCIUM 10.0 7.5*    CBC  Recent Labs Lab 06/09/16 1905 06/11/16 2053  WBC 13.7* 21.9*  HGB 9.4* 3.4*  HCT 31.7* 11.1*  PLT 244 195    Coag's  Recent Labs Lab 06/11/16 2053  INR 1.52    Sepsis Markers  Recent Labs Lab 06/09/16 1930 06/09/16 2240 06/11/16 2112  LATICACIDVEN 2.87* 1.14 1.36    ABG No results for input(s): PHART, PCO2ART, PO2ART in the last 168 hours.  Liver Enzymes  Recent Labs Lab 06/09/16 1905 06/11/16 2053  AST 14* 12*  ALT 13* 9*  ALKPHOS 64 37*  BILITOT 0.7 0.4  ALBUMIN 3.5 2.4*    Cardiac Enzymes No results for  input(s): TROPONINI, PROBNP in the last 168 hours.  Glucose No results for input(s): GLUCAP in the last 168 hours.  Imaging Dg Chest Port 1 View  Result Date: 06/11/2016 CLINICAL DATA:  Dyspnea.  Altered mental status. EXAM: PORTABLE CHEST 1 VIEW COMPARISON:  06/09/2016 FINDINGS: There is stable elevation of the left hemidiaphragm. No airspace consolidation. Hilar, mediastinal and cardiac contours are unremarkable and unchanged. No large effusions. IMPRESSION: No acute findings. Electronically Signed   By: Andreas Newport M.D.   On: 06/11/2016 21:04    STUDIES:   CULTURES:  ANTIBIOTICS:  SIGNIFICANT EVENTS: 11/04 Admit, GI consulted  LINES/TUBES:  DISCUSSION: 74 yo female with weakness, hypotension melena likely from upper GI bleed with severe anemia.  She has recently started using NSAIDs.  She has PMHx of Asthma, rhinitis, scoliosis with restrictive lung disease, OSA on Bipap, OHS on 2 liters oxygen, Breast  cancer, HTN.  ASSESSMENT / PLAN:  Hypotension 2nd to acute upper GI bleeding. - transfuse for Hb < 7 or bleeding - continue protonix gtt - GI consulted by EDP - continue IV fluids  Chronic hypoxic respiratory failure 2nd to OSA, OHS, scoliosis with restrictive lung disease, asthma. - brovana, pulmicort, duoneb - Bipap 18/14 cm H2O qhs and prn - oxygen to keep SpO2 90 to 95%  AKI likely from volume depletion. Lactic acidosis. - continue volume resuscitation  - f/u BMET, monitor urine outpt  DVT prophylaxis - SCDs SUP - protonix gtt Goals of care - full code  Updated pt's husband at bedside  CC time 40 minutes.  Chesley Mires, MD [facility] Pulmonary/Critical Care 06/12/2016, 12:59 AM Pager:  [phone] After 3pm call: [phone]

## 2016-06-12 NOTE — Op Note (Signed)
[facility] Patient Name: Tracey Nguyen Procedure Date : 06/12/2016 MRN: [id number] Attending MD: Lear Ng , MD Date of Birth: 1941-03-29 CSN: [id number] Age: 75 Admit Type: Inpatient Procedure:                Upper GI endoscopy Indications:              Acute post hemorrhagic anemia, Melena Providers:                Lear Ng, MD, Zenon Mayo, RN, Elspeth Cho, Technician Referring MD:              Medicines:                Propofol per CCM, Midazolam 1 mg IV; patient                            electively intubated prior to the procedure Complications:            No immediate complications. Estimated Blood Loss:     Estimated blood loss: none. Procedure:                Pre-Anesthesia Assessment:                           - Prior to the procedure, a History and Physical                            was performed, and patient medications and                            allergies were reviewed. The patient's tolerance of                            previous anesthesia was also reviewed. The risks                            and benefits of the procedure and the sedation                            options and risks were discussed with the patient.                            All questions were answered, and informed consent                            was obtained. Prior Anticoagulants: The patient has                            taken no previous anticoagulant or antiplatelet                            agents. ASA Grade Assessment: III - A patient with  severe systemic disease. After reviewing the risks                            and benefits, the patient was deemed in                            satisfactory condition to undergo the procedure.                           After obtaining informed consent, the endoscope was                            passed under direct vision. Throughout the                   procedure, the patient's blood pressure, pulse, and                            oxygen saturations were monitored continuously. The                            [id number]) scope was introduced through the                            mouth, and advanced to the second part of duodenum.                            The upper GI endoscopy was accomplished without                            difficulty. The patient tolerated the procedure                            well. Scope In: Scope Out: Findings:      The examined esophagus was normal.      One non-bleeding cratered gastric ulcer with pigmented material was       found in the gastric antrum. The lesion was 4 mm in largest dimension.      Segmental mild inflammation characterized by congestion (edema) was       found in the gastric antrum.      One non-bleeding superficial gastric ulcer with no stigmata of bleeding       was found at the pylorus. The lesion was 2 mm in largest dimension.      A few small sessile polyps with no bleeding and no stigmata of recent       bleeding were found in the gastric fundus and in the gastric body.      Few non-bleeding cratered duodenal ulcers with no stigmata of bleeding       were found in the duodenal bulb. The largest lesion was 10 mm in largest       dimension.      Patchy moderate inflammation characterized by congestion (edema),       erosions and serpentine ulcerations was found in the duodenal bulb.      The second portion of the duodenum was normal. Impression:               -  Normal esophagus.                           - Non-bleeding gastric ulcer with pigmented                            material.                           - Acute gastritis.                           - Non-bleeding gastric ulcer with no stigmata of                            bleeding.                           - A few gastric polyps.                           - Multiple non-bleeding duodenal ulcers with  no                            stigmata of bleeding.                           - Acute duodenitis.                           - Normal second portion of the duodenum.                           - No specimens collected. Moderate Sedation:      Moderate (conscious) sedation was administered by the endoscopy nurse       and supervised by the endoscopist. The following parameters were       monitored: oxygen saturation, heart rate, blood pressure, and response       to care. Recommendation:           - Perform an H. pylori serology.                           - Give Protonix (pantoprazole): 8 mg/hr IV by                            continuous infusion.                           - NPO.                           - Post procedure medication orders were given. Procedure Code(s):        --- Professional ---                           [phone], Esophagogastroduodenoscopy, flexible,  transoral; diagnostic, including collection of                            specimen(s) by brushing or washing, when performed                            (separate procedure) Diagnosis Code(s):        --- Professional ---                           K92.1, Melena (includes Hematochezia)                           K26.9, Duodenal ulcer, unspecified as acute or                            chronic, without hemorrhage or perforation                           K25.9, Gastric ulcer, unspecified as acute or                            chronic, without hemorrhage or perforation                           K29.00, Acute gastritis without bleeding                           K31.7, Polyp of stomach and duodenum                           K29.80, Duodenitis without bleeding                           D62, Acute posthemorrhagic anemia CPT copyright 2016 American [employer] Association. All rights reserved. The codes documented in this report are preliminary and upon coder review may  be revised to meet current compliance  requirements. Lear Ng, MD 06/12/2016 4:58:28 PM This report has been signed electronically. Number of Addenda: 0

## 2016-06-12 NOTE — Progress Notes (Signed)
RT note- assisted with intubation, with follow up xray, ETT was repostioned times 2 and now is secure at 18cm.

## 2016-06-12 NOTE — Telephone Encounter (Signed)
Post ED Visit - Positive Culture Follow-up  Culture report reviewed by antimicrobial stewardship pharmacist:  []  Elenor Quinones, Pharm.D. []  Heide Guile, Pharm.D., BCPS []  Parks Neptune, Pharm.D. []  Alycia Rossetti, Pharm.D., BCPS []  [place], Pharm.D., BCPS, AAHIVP []  Legrand Como, Pharm.D., BCPS, AAHIVP []  Milus Glazier, Pharm.D. []  Rob Muniz, Pharm.D. Dimitri Ped Pharm D Positive urine culture Treated with Cephalexin, organism sensitive to the same and no further patient follow-up is required at this time.  Genia Del 06/12/2016, 9:19 AM

## 2016-06-12 NOTE — Progress Notes (Signed)
RT note-ETT repositioned per Marni Griffon NP, BBS equal.

## 2016-06-12 NOTE — Progress Notes (Addendum)
PULMONARY / CRITICAL CARE MEDICINE   Name: Tracey Nguyen MRN: [id number] DOB: [birth year]    ADMISSION DATE:  06/11/2016  REFERRING MD:  Dr. Jeneen Rinks, ER  CHIEF COMPLAINT:  Weak  brief 75 yo female started feeling weak over the past few days.  She was noted to be tachycardic at rehab.  She called pulmonary office and advised to go to ER for assessment.  She had mild anemia and there was concern for UTI.  She was recently switched from tylenol to [facility] for pain control. Her husband reports that on 11/02 she looked "white as a ghost".  She was also having black stool, but he thought his was from her antibiotic.  She became progressively weaker, and he brought her back to the ER.  She was found to have severe anemia.  She is being given PRBC, and reports symptomatic improvement with transfusion.  She also has been started on protonix gtt, and GI has been called by EDP.  She denies alcohol use or smoking.  She is not aware of stomach ulcers before.  She is not having chest pain or abdominal pain.  She does feel bloated, and reports last BM was on 11/03.   SIGNIFICANT EVENTS: 11/04 Admit, GI consulted   SUBJECTIVE:  06/12/16 repeat rounds - GI consult pending. No active bleed per RN. Normotensive got 2 U PRBC per RN hgb 3s to 7. Finishing her night CPAP Rx. Per RN has bad severe OSA (dr Dohmeier  AHI 87)  VITAL SIGNS: BP (!) 89/49   Pulse 82   Temp 99.6 F (37.6 C) (Axillary)   Resp 18   Ht 4\' 10"  (1.473 m)   Wt 78.3 kg (172 lb 9.9 oz)   SpO2 100%   BMI 36.08 kg/m   HEMODYNAMICS:    VENTILATOR SETTINGS: FiO2 (%):  [35 %] 35 %  INTAKE / OUTPUT: I/O last 3 completed shifts: In: 2096.5 [I.V.:479.2; Blood:1017.3; IV Piggyback:600] Out: -   PHYSICAL EXAMINATION: General: pimproved color. Obese Neuro:  Alert, slow verbal responses, follows commands. On CPAP HEENT:  No stridor, no lan. On CPAP Cardiovascular:  Regular, no murmur Lungs:  No wheeze/rales Abdomen:  Mild  distention, increased tympany, non tender Musculoskeletal:  No edema, Lt wrist in wrap Skin:  No rashes  LABS:  PULMONARY No results for input(s): PHART, PCO2ART, PO2ART, HCO3, TCO2, O2SAT in the last 168 hours.  Invalid input(s): PCO2, PO2  CBC  Recent Labs Lab 06/09/16 1905 06/11/16 2053 06/12/16 0711  HGB 9.4* 3.4* 7.0*  HCT 31.7* 11.1* 21.0*  WBC 13.7* 21.9* 19.0*  PLT 244 195 180    COAGULATION  Recent Labs Lab 06/11/16 2053  INR 1.52    CARDIAC  No results for input(s): TROPONINI in the last 168 hours. No results for input(s): PROBNP in the last 168 hours.   CHEMISTRY  Recent Labs Lab 06/09/16 1905 06/11/16 2053  NA 142 140  K 4.3 3.5  CL 101 109  CO2 30 22  GLUCOSE 161* 112*  BUN 71* 116*  CREATININE 1.36* 2.39*  CALCIUM 10.0 7.5*   Estimated Creatinine Clearance: 17.7 mL/min (by C-G formula based on SCr of 2.39 mg/dL (H)).   LIVER  Recent Labs Lab 06/09/16 1905 06/11/16 2053  AST 14* 12*  ALT 13* 9*  ALKPHOS 64 37*  BILITOT 0.7 0.4  PROT 6.1* 4.1*  ALBUMIN 3.5 2.4*  INR  --  1.52     INFECTIOUS  Recent Labs Lab 06/09/16 1930 06/09/16 2240  06/11/16 2112  LATICACIDVEN 2.87* 1.14 1.36     ENDOCRINE CBG (last 3)   Recent Labs  06/12/16 0201  GLUCAP 130*         IMAGING x48h  - image(s) personally visualized  -   highlighted in bold Dg Chest Port 1 View  Result Date: 06/11/2016 CLINICAL DATA:  Dyspnea.  Altered mental status. EXAM: PORTABLE CHEST 1 VIEW COMPARISON:  06/09/2016 FINDINGS: There is stable elevation of the left hemidiaphragm. No airspace consolidation. Hilar, mediastinal and cardiac contours are unremarkable and unchanged. No large effusions. IMPRESSION: No acute findings. Electronically Signed   By: Andreas Newport M.D.   On: 06/11/2016 21:04      DISCUSSION: 75 yo female with weakness, hypotension melena likely from upper GI bleed with severe anemia.  She has recently started using  NSAIDs.  She has PMHx of Asthma, rhinitis, scoliosis with restrictive lung disease, OSA on Bipap, OHS on 2 liters oxygen, Breast cancer, HTN.  ASSESSMENT / PLAN:  Hypotension 2nd to acute upper GI bleeding at admit   06/12/16 -normotensive. No active bleed  PLAN - transfuse for Hb < 7 or bleeding - continue protonix gtt - GI consulted by EDP d/w Dr Michail Sermon - he will see - continue IV fluids -levophed for MAP > 65 - maintain NPO   Chronic hypoxic respiratory failure 2nd to OSA, OHS, scoliosis with restrictive lung disease, asthma.  - stable clinically  PLAN - check abg stat - might need intubation for endoscopy sedation needs in presence of severe OSA - brovana, pulmicort, duoneb - Bipap 18/14 cm H2O qhs and prn - oxygen to keep SpO2 90 to 95%   AKI likely from volume depletion. Lactic acidosis.   - lactic acidosis resolved but AKI might be worse  PLAN - continue volume resuscitation  - f/u BMET, monitor urine outpt  DVT prophylaxis - SCDs SUP - protonix gtt Goals of care - full code  Dr Halford Chessman Updated pt's husband at bedside at admit      The patient is critically ill with multiple organ systems failure and requires high complexity decision making for assessment and support, frequent evaluation and titration of therapies, application of advanced monitoring technologies and extensive interpretation of multiple databases.   Critical Care Time devoted to patient care services described in this note is  30  Minutes. This time reflects time of care of this signee Dr Brand Males. This critical care time does not reflect procedure time, or teaching time or supervisory time of PA/NP/Med student/Med Resident etc but could involve care discussion time    Dr. Brand Males, M.D., [facility] Pulmonary and Critical Care Medicine Staff Physician [facility] Pager: [phone], If no answer or between  15:00h - 7:00h: call [phone]  06/12/2016 8:11 AM

## 2016-06-12 NOTE — H&P (View-Only) (Signed)
Referring Provider: Dr. Chase Caller Primary Care Physician:  Dorothyann Peng, NP Primary Gastroenterologist:  Althia Forts  Reason for Consultation:  GI bleed; Anemia  HPI: Tracey Nguyen is a 75 y.o. female seen for a consult due to black stools and anemia. Severe weakness for the past few days and Hgb 3.4 (9.4 on 06/09/16). Black stools have been occurring this week. Denies hematemesis, nausea, or vomiting. Has been on Aleve recently. Denies abdominal pain. Hgb 7 (s/p 2 U PRBCs). No family at bedside.    Past Medical History:  Diagnosis Date  . Alpha thalassemia minor trait   . Arthritis   . Asthma   . Blood transfusion    after femur surgery  . Cancer [facility]) 2007   breast cancer  . Cataract    BILATERAL -REMOVED VIA SURGERY  . COLONIC POLYPS, HX OF 08/17/2007   Qualifier: Diagnosis of  By: Sherren Mocha MD, Jory Ee   . Complication of anesthesia    hard to wake up in 1970  . GERD (gastroesophageal reflux disease)   . Headache(784.0)    hx of migraines  . History of D&C   . Hypertension   . Osteoporosis    tx with boniva in the past  . Oxygen dependent   . Oxygen dependent   . Pneumonia    hx of  . S/P removal of and implantation of new IM nail, right hip 10/30/2012  . Scoliosis/kyphoscoliosis   . Sleep apnea    on cpap  . 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 BREAST BIOPSY  1995   left  . CATARACT EXTRACTION, BILATERAL  2006  . DILATION AND CURETTAGE OF UTERUS    . FEMUR FRACTURE SURGERY  2011   titanium rod/right  . FEMUR IM NAIL Right 10/30/2012  . hemorrhoidectomy  1987  . HIP SURGERY  10/30/12  . MASTECTOMY  2007   right    Prior to Admission medications   Medication Sig Start Date End Date Taking? Authorizing Provider  budesonide-formoterol (SYMBICORT) 160-4.5 MCG/ACT inhaler Inhale 2 puffs into the lungs 2 (two) times daily. 03/22/16  Yes Praveen Mannam, MD  cholecalciferol (VITAMIN  D) 1000 UNITS tablet Take 1,000 Units by mouth daily.   Yes Historical Provider, MD  dextromethorphan (DELSYM) 30 MG/5ML liquid Take 60 mg by mouth 2 (two) times daily as needed for cough. 2 tsp by mouth twice daily as needed    Yes Historical Provider, MD  DULoxetine (CYMBALTA) 30 MG capsule TAKE 1 CAPSULE BY MOUTH ONCE DAILY 01/15/16  Yes Dorothyann Peng, NP  fluticasone (FLONASE) 50 MCG/ACT nasal spray USE 1 SPRAY IN EACH NOSTRIL EVERY DAY 01/19/16  Yes Praveen Mannam, MD  furosemide (LASIX) 40 MG tablet Take 1 tablet (40 mg total) by mouth daily. 12/10/15  Yes Dorothyann Peng, NP  Ipratropium-Albuterol (COMBIVENT RESPIMAT) 20-100 MCG/ACT AERS respimat Inhale 1 puff into the lungs every 6 (six) hours as needed for wheezing. 03/22/16  Yes Praveen Mannam, MD  losartan (COZAAR) 50 MG tablet TAKE 1 TABLET (50 MG TOTAL) BY MOUTH DAILY. Patient taking differently: Take 75 mg by mouth at bedtime. TAKE 1 TABLET (50 MG TOTAL) BY MOUTH DAILY. 12/10/15  Yes Dorothyann Peng, NP  Multiple Vitamins-Minerals (PRESERVISION AREDS PO) Take 1 tablet by mouth 2 (two) times daily.    Yes Historical Provider, MD  polyethylene glycol (MIRALAX / GLYCOLAX) packet Take 17 g by mouth as needed for mild constipation.  11/02/12  Yes Matthew Babish, PA-C  pramipexole (MIRAPEX) 1 MG tablet TAKE 1 & 1/2 TABLETS BY MOUTH ONCE EVERY EVENING 07/23/15  Yes Larey Seat, MD  Probiotic Product (ALIGN) 4 MG CAPS Take 1 capsule by mouth daily.    Yes Historical Provider, MD  vitamin B-12 (CYANOCOBALAMIN) 100 MCG tablet Take 100 mcg by mouth daily.   Yes Historical Provider, MD    Scheduled Meds: . arformoterol  15 mcg Nebulization BID  . budesonide (PULMICORT) nebulizer solution  0.5 mg Nebulization BID  . chlorhexidine  15 mL Mouth Rinse BID  . mouth rinse  15 mL Mouth Rinse q12n4p   Continuous Infusions: . sodium chloride 75 mL/hr at 06/12/16 1200  . sodium chloride 20 mL/hr at 06/12/16 1250  . pantoprozole (PROTONIX) infusion 8 mg/hr  (06/12/16 1200)  . phenylephrine (NEO-SYNEPHRINE) Adult infusion 40 mcg/min (06/12/16 1245)  . propofol (DIPRIVAN) infusion     PRN Meds:.fentaNYL (SUBLIMAZE) injection, fentaNYL (SUBLIMAZE) injection, ipratropium-albuterol  Allergies as of 06/11/2016 - Review Complete 06/11/2016  Allergen Reaction Noted  . Augmentin [amoxicillin-pot clavulanate] Diarrhea 08/16/2014  . Codeine Other (See Comments) 05/25/2007    Family History  Problem Relation Age of Onset  . Dermatomyositis Mother   . Heart attack Father   . Tuberculosis Father   . Cancer Sister     Cervical ?   . Thalassemia Sister     Social History   Social History  . Marital status: Married    Spouse name: [name]  . Number of children: 1  . Years of education: 21   Occupational History  .  Retired   Social History Main Topics  . Smoking status: Former Smoker    Packs/day: 0.20    Years: 10.00    Types: Cigarettes    Quit date: 08/09/1964  . Smokeless tobacco: Never Used  . Alcohol use No  . Drug use: No  . Sexual activity: No   Other Topics Concern  . Not on file   Social History Narrative   patient needs to be seen face to face for oxygen qualification, RR is 16 , min. mallompati 4 and co2 retention from scoliosis overlap with severe OSA - AHI was 87 at baseline. titrated to 13 cm water CPAP, AHC to follow.   Patient is married [name]) and lives at home with her husband.   Patient has one child.   Patient is retired.   Patient has a high school education.   Patient is right-handed.   Patient drinks very little caffeine.              Review of Systems: All negative except as stated above in HPI.  Physical Exam: Vital signs: Vitals:   06/12/16 1220 06/12/16 1227  BP: (!) 93/57 (!) 93/57  Pulse: 81 81  Resp: 19 19  Temp:  99.1 F (37.3 C)   Last BM Date:  (PTA) General:   Lethargic, elderly, Well-developed, well-nourished, pleasant and cooperative, mild acute distress HEENT: anicteric  sclera, oropharynx clear Neck: supple, nontender Lungs:  Clear throughout to auscultation.   No wheezes, crackles, or rhonchi. No acute distress. Heart:  Regular rate and rhythm; no murmurs, clicks, rubs,  or gallops. Abdomen: RUQ and epigastric tenderness with guarding, soft, nondistended, +BS  Rectal:  Deferred Ext: no edema  GI:  Lab Results:  Recent Labs  06/09/16 1905 06/11/16 2053 06/12/16 0711 06/12/16 0900  WBC 13.7* 21.9* 19.0*  --   HGB 9.4* 3.4* 7.0* 6.9*  HCT 31.7*  11.1* 21.0* 20.2*  PLT 244 195 180  --    BMET  Recent Labs  06/09/16 1905 06/11/16 2053 06/12/16 0711  NA 142 140 140  K 4.3 3.5 3.9  CL 101 109 108  CO2 30 22 23   GLUCOSE 161* 112* 117*  BUN 71* 116* 114*  CREATININE 1.36* 2.39* 2.37*  CALCIUM 10.0 7.5* 8.3*   LFT  Recent Labs  06/11/16 2053  PROT 4.1*  ALBUMIN 2.4*  AST 12*  ALT 9*  ALKPHOS 37*  BILITOT 0.4   PT/INR  Recent Labs  06/11/16 2053  LABPROT 18.4*  INR 1.52     Studies/Results: Dg Chest Port 1 View  Result Date: 06/11/2016 CLINICAL DATA:  Dyspnea.  Altered mental status. EXAM: PORTABLE CHEST 1 VIEW COMPARISON:  06/09/2016 FINDINGS: There is stable elevation of the left hemidiaphragm. No airspace consolidation. Hilar, mediastinal and cardiac contours are unremarkable and unchanged. No large effusions. IMPRESSION: No acute findings. Electronically Signed   By: Andreas Newport M.D.   On: 06/11/2016 21:04    Impression/Plan: 75 yo with multiple medical problems who has been having black stools and severe anemia of Hgb 3.4 in need of an EGD to look for peptic ulcer disease. Continue Protonix drip. NPO. Will need intubation prior to EGD for airway protection and ventilation support prior to sedation due to her severe OSA and restrictive lung disease. Discussed risks/benefits of EGD with pt and she agrees to proceed.    LOS: 0 days   [place] C.  06/12/2016, 12:52 PM  Pager [phone]  If no answer or  after 5 PM call [phone]

## 2016-06-13 DIAGNOSIS — J9601 Acute respiratory failure with hypoxia: Secondary | ICD-10-CM

## 2016-06-13 DIAGNOSIS — R579 Shock, unspecified: Secondary | ICD-10-CM

## 2016-06-13 LAB — GLUCOSE, CAPILLARY
GLUCOSE-CAPILLARY: 111 mg/dL — AB (ref 65–99)
GLUCOSE-CAPILLARY: 114 mg/dL — AB (ref 65–99)
Glucose-Capillary: 116 mg/dL — ABNORMAL HIGH (ref 65–99)
Glucose-Capillary: 138 mg/dL — ABNORMAL HIGH (ref 65–99)
Glucose-Capillary: 115 mg/dL — ABNORMAL HIGH (ref 65–99)
Glucose-Capillary: 162 mg/dL — ABNORMAL HIGH (ref 65–99)

## 2016-06-13 LAB — MAGNESIUM: MAGNESIUM: 2.1 mg/dL (ref 1.7–2.4)

## 2016-06-13 LAB — BASIC METABOLIC PANEL
Anion gap: 6 (ref 5–15)
BUN: 68 mg/dL — AB (ref 6–20)
CALCIUM: 8 mg/dL — AB (ref 8.9–10.3)
CO2: 24 mmol/L (ref 22–32)
CREATININE: 1.69 mg/dL — AB (ref 0.44–1.00)
Chloride: 115 mmol/L — ABNORMAL HIGH (ref 101–111)
GFR calc Af Amer: 33 mL/min — ABNORMAL LOW (ref >60)
GFR, EST NON AFRICAN AMERICAN: 28 mL/min — AB (ref >60)
GLUCOSE: 119 mg/dL — AB (ref 65–99)
Potassium: 3.6 mmol/L (ref 3.5–5.1)
SODIUM: 145 mmol/L (ref 135–145)

## 2016-06-13 LAB — CBC WITH DIFFERENTIAL/PLATELET
Basophils Absolute: 0.1 10*3/uL (ref 0.0–0.1)
Basophils Relative: 1 %
EOS ABS: 0.2 10*3/uL (ref 0.0–0.7)
Eosinophils Relative: 2 %
HEMATOCRIT: 23.6 % — AB (ref 36.0–46.0)
HEMOGLOBIN: 7.8 g/dL — AB (ref 12.0–15.0)
LYMPHS ABS: 2 10*3/uL (ref 0.7–4.0)
LYMPHS PCT: 15 %
MCH: 25 pg — ABNORMAL LOW (ref 26.0–34.0)
MCHC: 33.1 g/dL (ref 30.0–36.0)
MCV: 75.6 fL — ABNORMAL LOW (ref 78.0–100.0)
MONO ABS: 1.1 10*3/uL — AB (ref 0.1–1.0)
Monocytes Relative: 9 %
NEUTROS PCT: 73 %
Neutro Abs: 9.7 10*3/uL — ABNORMAL HIGH (ref 1.7–7.7)
PLATELETS: 140 10*3/uL — AB (ref 150–400)
RBC: 3.12 MIL/uL — AB (ref 3.87–5.11)
RDW: 21.4 % — ABNORMAL HIGH (ref 11.5–15.5)
WBC: 13 10*3/uL — AB (ref 4.0–10.5)

## 2016-06-13 LAB — PHOSPHORUS: Phosphorus: 2.6 mg/dL (ref 2.5–4.6)

## 2016-06-13 MED ORDER — PRAMIPEXOLE DIHYDROCHLORIDE 1.5 MG PO TABS
1.5000 mg | ORAL_TABLET | Freq: Every day | ORAL | Status: DC
  Administered 2016-06-13 – 2016-06-17 (×5): 1.5 mg via ORAL
  Filled 2016-06-13 (×6): qty 1

## 2016-06-13 MED ORDER — RACEPINEPHRINE HCL 2.25 % IN NEBU
0.5000 mL | INHALATION_SOLUTION | RESPIRATORY_TRACT | Status: DC | PRN
  Administered 2016-06-13: 0.5 mL via RESPIRATORY_TRACT

## 2016-06-13 MED ORDER — CHLORHEXIDINE GLUCONATE 0.12 % MT SOLN
15.0000 mL | Freq: Two times a day (BID) | OROMUCOSAL | Status: DC
  Administered 2016-06-14: 15 mL via OROMUCOSAL

## 2016-06-13 MED ORDER — CHLORHEXIDINE GLUCONATE 0.12 % MT SOLN: 15.0000 mL | Freq: Two times a day (BID) | OROMUCOSAL | Status: DC

## 2016-06-13 MED ORDER — ORAL CARE MOUTH RINSE
15.0000 mL | Freq: Two times a day (BID) | OROMUCOSAL | Status: DC
  Administered 2016-06-13 – 2016-06-14 (×2): 15 mL via OROMUCOSAL

## 2016-06-13 MED ORDER — ORAL CARE MOUTH RINSE: 15.0000 mL | Freq: Two times a day (BID) | OROMUCOSAL | Status: DC

## 2016-06-13 MED ORDER — RACEPINEPHRINE HCL 2.25 % IN NEBU
INHALATION_SOLUTION | RESPIRATORY_TRACT | Status: AC
  Filled 2016-06-13: qty 0.5

## 2016-06-13 NOTE — Progress Notes (Addendum)
[facility] Progress Note  Tracey Nguyen 75 y.o. 1940/12/31   Subjective: Extubated this morning. Sitting in bedside chair. Feels better. Black formed stool today. Family in room.  Objective: Vital signs in last 24 hours: Vitals:   06/13/16 1145 06/13/16 1200  BP:  102/62  Pulse:  91  Resp: 20 (!) 25  Temp:     T 99.4  Physical Exam: Gen: lethargic, elderly, frail, no acute distress CV: RRR Chest: Coarse breath sounds Abd: soft, nontender, nondistended, +BS  Lab Results:  Recent Labs  06/12/16 0711 06/13/16 0343  NA 140 145  K 3.9 3.6  CL 108 115*  CO2 23 24  GLUCOSE 117* 119*  BUN 114* 68*  CREATININE 2.37* 1.69*  CALCIUM 8.3* 8.0*  MG  --  2.1  PHOS  --  2.6    Recent Labs  06/11/16 2053  AST 12*  ALT 9*  ALKPHOS 37*  BILITOT 0.4  PROT 4.1*  ALBUMIN 2.4*    Recent Labs  06/11/16 2053 06/12/16 0711  06/12/16 1520 06/12/16 2214  WBC 21.9* 19.0*  --   --   --   NEUTROABS 18.2*  --   --   --   --   HGB 3.4* 7.0*  < > 8.4* 8.2*  HCT 11.1* 21.0*  < > 24.8* 25.2*  MCV 64.2* 72.2*  --   --   --   PLT 195 180  --   --   --   < > = values in this interval not displayed.  Recent Labs  06/11/16 2053  LABPROT 18.4*  INR 1.52      Assessment/Plan: GI bleed - likely due to duodenal ulcers with severe anemia on admit (Hgb 3.4). Hgb 8.2 yesterday. Recheck CBC ordered. H. Pylori serology pending (if positive then would treat). Black stools likely residual blood. Continue Protonix drip. Clears ok but defer timing to CCM due to recent extubation. Patient sees Dr. Henrene Pastor and has a history of colon polyps (husband reports she is due for a surveillance colonoscopy). Would have pt f/u with Dr. Henrene Pastor as an outpt in [place] to discuss timing of repeat colonoscopy. I do not think an inpatient colonoscopy is needed. If Hgb remains stable, then d/c Protonix drip tomorrow and change to IV PPI Q 12 hours (if available) or PPI PO BID when on solid  food. Will sign off. Call if questions.   [place] C. 06/13/2016, 12:35 PM  Pager [phone]  If no answer or after 5 PM call [phone] ID: Tracey Nguyen, female   DOB: [birth year], 76 y.o.   MRN: [id number]

## 2016-06-13 NOTE — Procedures (Signed)
Extubation Procedure Note  Patient Details:   Name: Tracey Nguyen DOB: August 08, 1941 MRN: [id number]   Airway Documentation:     Evaluation  O2 sats: stable throughout Complications: No apparent complications Patient did tolerate procedure well. Bilateral Breath Sounds: Clear   Yes  Placed on 3l/min Tracey Nguyen IS instructed 31ml  Revonda Standard 06/13/2016, 9:03 AM

## 2016-06-13 NOTE — Progress Notes (Signed)
[facility] Progress Note Patient Name: Tracey Nguyen DOB: 07-13-1941 MRN: [id number]   Date of Service  06/13/2016  HPI/Events of Note  Stridor vs upper airway constriction. Hx of GI bleeding (esphagitits/gastritis d/t NSAID's. Would like to avoid steroids is possible.   eICU Interventions  Will order: 1. Racemic Epinephrine nebs Q 1 hour PRN.         Redith Drach Cornelia Copa 06/13/2016, 4:36 PM

## 2016-06-13 NOTE — Progress Notes (Signed)
[facility] Progress Note Patient Name: Tracey Nguyen DOB: 1940-11-04 MRN: [id number]   Date of Service  06/13/2016  HPI/Events of Note  Request for Home Mirapex.  eICU Interventions  Will order: 1. Mirapex 1.5 mg PO Q HS.      Intervention Category Minor Interventions: Routine modifications to care plan (e.g. PRN medications for pain, fever)  Tracey Nguyen 06/13/2016, 8:04 PM

## 2016-06-13 NOTE — Progress Notes (Signed)
RT note Racemic nebulizer given per order, patient has mild inspiratory wheeze on left side, when Bipap is removed, patient has poor cough, rhonchi, unable to clear secretions.

## 2016-06-13 NOTE — Progress Notes (Signed)
PULMONARY / CRITICAL CARE MEDICINE   Name: Tracey Nguyen MRN: [id number] DOB: May 20, 1941    ADMISSION DATE:  06/11/2016  REFERRING MD:  Dr. Jeneen Rinks, ER  CHIEF COMPLAINT:  Weak  brief 75 yo female started feeling weak over the past few days.  She was noted to be tachycardic at rehab.  She called pulmonary office and advised to go to ER for assessment.  She had mild anemia and there was concern for UTI.  She was recently switched from tylenol to [facility] for pain control. Her husband reports that on 11/02 she looked "white as a ghost".  She was also having black stool, but he thought his was from her antibiotic.  She became progressively weaker, and he brought her back to the ER.  She was found to have severe anemia.  She is being given PRBC, and reports symptomatic improvement with transfusion.  She also has been started on protonix gtt, and GI has been called by EDP.  She denies alcohol use or smoking.  She is not aware of stomach ulcers before.  She is not having chest pain or abdominal pain.  She does feel bloated, and reports last BM was on 11/03.  She has PMHx of Asthma, rhinitis, scoliosis with restrictive lung disease, OSA on Bipap, OHS on 2 liters oxygen, Breast cancer, HTN. Recently started using NSAIDs   SIGNIFICANT EVENTS: 11/04 Admit, GI consulted 06/12/16 repeat rounds - GI consult pending. No active bleed per RN. Normotensive got 2 U PRBC per RN hgb 3s to 7. Finishing her night CPAP Rx. Per RN has bad severe OSA (dr Dohmeier  AHI 87)   SUBJECTIVE/OVERNIGHT/INTERVAL HX 06/13/16 - intubated yesterday for endoscopy which showed 2 non bleeding gastruc ulcers - ? Biopsy sent and acute duodenitis. No futher bleeding but remains on precedex. Meets extubation criteria. AKI improving   VITAL SIGNS: BP (!) 92/58 (BP Location: Left Arm)   Pulse (!) 59   Temp 99.4 F (37.4 C) (Oral)   Resp 20   Ht 4\' 10"  (1.473 m)   Wt 81.1 kg (178 lb 12.7 oz)   SpO2 100%   BMI 37.37 kg/m    HEMODYNAMICS:    VENTILATOR SETTINGS: Vent Mode: PRVC FiO2 (%):  [40 %-100 %] 40 % Set Rate:  [16 bmp] 16 bmp Vt Set:  [350 mL] 350 mL PEEP:  [5 cmH20] 5 cmH20 Pressure Support:  [5 cmH20] 5 cmH20 Plateau Pressure:  [17 cmH20-19 cmH20] 19 cmH20  INTAKE / OUTPUT: I/O last 3 completed shifts: In: 5679 [I.V.:3681.7; Blood:1347.3; Other:50; IV [id number] Out: 1000 [Urine:1000]  PHYSICAL EXAMINATION: General: r. Obese Neuro:  Alert,  follows commands. On intubated vent support. Moves all 4s HEENT:  No stridor, no lan. On CPAP Cardiovascular:  Regular, no murmur Lungs:  No wheeze/rales Abdomen:  Mild distention, increased tympany, non tender Musculoskeletal:  No edema, Lt wrist in wrap Skin:  No rashes   LABS:  PULMONARY  Recent Labs Lab 06/12/16 0906  PHART 7.412  PCO2ART 37.5  PO2ART 118.0*  HCO3 23.9  TCO2 25  O2SAT 99.0    CBC  Recent Labs Lab 06/09/16 1905 06/11/16 2053 06/12/16 0711  06/12/16 1308 06/12/16 1520 06/12/16 2214  HGB 9.4* 3.4* 7.0*  < > 8.2* 8.4* 8.2*  HCT 31.7* 11.1* 21.0*  < > 24.6* 24.8* 25.2*  WBC 13.7* 21.9* 19.0*  --   --   --   --   PLT 244 195 180  --   --   --   --   < > =  values in this interval not displayed.  COAGULATION  Recent Labs Lab 06/11/16 2053  INR 1.52    CARDIAC    Recent Labs Lab 06/12/16 0854 06/12/16 1520 06/12/16 2214  TROPONINI 0.06* 0.06* 0.07*   No results for input(s): PROBNP in the last 168 hours.   CHEMISTRY  Recent Labs Lab 06/09/16 1905 06/11/16 2053 06/12/16 0711 06/13/16 0343  NA 142 140 140 145  K 4.3 3.5 3.9 3.6  CL 101 109 108 115*  CO2 30 22 23 24   GLUCOSE 161* 112* 117* 119*  BUN 71* 116* 114* 68*  CREATININE 1.36* 2.39* 2.37* 1.69*  CALCIUM 10.0 7.5* 8.3* 8.0*  MG  --   --   --  2.1  PHOS  --   --   --  2.6   Estimated Creatinine Clearance: 25.5 mL/min (by C-G formula based on SCr of 1.69 mg/dL (H)).   LIVER  Recent Labs Lab 06/09/16 1905  06/11/16 2053  AST 14* 12*  ALT 13* 9*  ALKPHOS 64 37*  BILITOT 0.7 0.4  PROT 6.1* 4.1*  ALBUMIN 3.5 2.4*  INR  --  1.52     INFECTIOUS  Recent Labs Lab 06/09/16 1930 06/09/16 2240 06/11/16 2112  LATICACIDVEN 2.87* 1.14 1.36     ENDOCRINE CBG (last 3)   Recent Labs  06/13/16 0006 06/13/16 0330 06/13/16 0843  GLUCAP 114* 116* 138*         IMAGING x48h  - image(s) personally visualized  -   highlighted in bold Dg Chest Port 1 View  Result Date: 06/12/2016 CLINICAL DATA:  Status post intubation EXAM: PORTABLE CHEST 1 VIEW COMPARISON:  06/11/2016 FINDINGS: Cardiac shadow remains enlarged. Endotracheal tube is noted just above the carina and should be withdrawn 1-2 cm. Some elevation of left hemidiaphragm is again seen with some increased atelectatic changes. This may be related to the endotracheal tube placement. No bony abnormality is seen. IMPRESSION: Endotracheal tube at the level of the carina with some increased left basilar atelectasis. The tube should be withdrawn 1-2 cm. These results were called by telephone at the time of interpretation on 06/12/2016 at 1:03 pm to [facility], the pts nurse who verbally acknowledged these results. Electronically Signed   By: Inez Catalina M.D.   On: 06/12/2016 13:04   Dg Chest Port 1 View  Result Date: 06/11/2016 CLINICAL DATA:  Dyspnea.  Altered mental status. EXAM: PORTABLE CHEST 1 VIEW COMPARISON:  06/09/2016 FINDINGS: There is stable elevation of the left hemidiaphragm. No airspace consolidation. Hilar, mediastinal and cardiac contours are unremarkable and unchanged. No large effusions. IMPRESSION: No acute findings. Electronically Signed   By: Andreas Newport M.D.   On: 06/11/2016 21:04      ASSESSMENT / PLAN:  Hypotension/Circulatlry shock  2nd to acute upper GI bleeding at admit   06/13/16 -needing neo that was started whileon diprivan for procedure No active bleed  PLAN - transfuse for Hb < 7 or bleeding - continue  protonix gtt -neo for MAP > 65 - maintain NPO   GI bleed - GU bleed and duodenitis. Likely due to NSAIDS  - continue PPI - miantina nPO - further insturctions by GI  Chronic hypoxic respiratory failure 2nd to OSA, OHS, scoliosis with restrictive lung disease, asthma. Acute preop resp failure = intubated for procedure  - stable clinically  PLAN - extubate - check abg stat - might need intubation for endoscopy sedation needs in presence of severe OSA - brovana, pulmicort, duoneb - Bipap 18/14  cm H2O qhs and prn - oxygen to keep SpO2 90 to 95%   AKI likely from volume depletion. Lactic acidosis.   - lactic acidosis resolvedand AKI  improved  PLAN - continue volume resuscitation  - f/u BMET, monitor urine outpt  DVT prophylaxis - SCDs SUP - protonix gtt Goals of care - full code  Dr Halford Chessman Updated pt's husband at bedside at admit 06/11/2016 and Dr Chase Caller patient 06/13/2016       The patient is critically ill with multiple organ systems failure and requires high complexity decision making for assessment and support, frequent evaluation and titration of therapies, application of advanced monitoring technologies and extensive interpretation of multiple databases.   Critical Care Time devoted to patient care services described in this note is  30  Minutes. This time reflects time of care of this signee Dr Brand Males. This critical care time does not reflect procedure time, or teaching time or supervisory time of PA/NP/Med student/Med Resident etc but could involve care discussion time    Dr. Brand Males, M.D., [facility].C.P Pulmonary and Critical Care Medicine Staff Physician [place] Pager: [phone], If no answer or between  15:00h - 7:00h: call [phone]  06/13/2016 8:49 AM

## 2016-06-13 NOTE — Progress Notes (Addendum)
Having some increased respiratory effort s/p extubation.  Seems like more soft tissue than actually airway  Plan Keep in ICU BIPAP now, then PRN and mandatory at [place] ACNP-BC [place] Pager # [phone] OR # [phone] if no answer

## 2016-06-14 ENCOUNTER — Inpatient Hospital Stay (HOSPITAL_COMMUNITY): Payer: Medicare Other

## 2016-06-14 ENCOUNTER — Encounter (HOSPITAL_COMMUNITY): Payer: Self-pay | Admitting: Gastroenterology

## 2016-06-14 DIAGNOSIS — J9601 Acute respiratory failure with hypoxia: Secondary | ICD-10-CM

## 2016-06-14 LAB — CBC
HCT: 24.6 % — ABNORMAL LOW (ref 36.0–46.0)
Hemoglobin: 7.6 g/dL — ABNORMAL LOW (ref 12.0–15.0)
MCH: 24.5 pg — ABNORMAL LOW (ref 26.0–34.0)
MCHC: 30.9 g/dL (ref 30.0–36.0)
MCV: 79.4 fL (ref 78.0–100.0)
PLATELETS: 126 10*3/uL — AB (ref 150–400)
RBC: 3.1 MIL/uL — AB (ref 3.87–5.11)
RDW: 22.4 % — ABNORMAL HIGH (ref 11.5–15.5)
WBC: 11.2 10*3/uL — AB (ref 4.0–10.5)

## 2016-06-14 LAB — GLUCOSE, CAPILLARY
GLUCOSE-CAPILLARY: 102 mg/dL — AB (ref 65–99)
GLUCOSE-CAPILLARY: 105 mg/dL — AB (ref 65–99)
GLUCOSE-CAPILLARY: 105 mg/dL — AB (ref 65–99)
GLUCOSE-CAPILLARY: 94 mg/dL (ref 65–99)
GLUCOSE-CAPILLARY: 99 mg/dL (ref 65–99)
Glucose-Capillary: 112 mg/dL — ABNORMAL HIGH (ref 65–99)

## 2016-06-14 LAB — BASIC METABOLIC PANEL
Anion gap: 5 (ref 5–15)
Anion gap: 8 (ref 5–15)
BUN: 31 mg/dL — ABNORMAL HIGH (ref 6–20)
BUN: 27 mg/dL — AB (ref 6–20)
CALCIUM: 8.3 mg/dL — AB (ref 8.9–10.3)
CALCIUM: 8.7 mg/dL — AB (ref 8.9–10.3)
CO2: 24 mmol/L (ref 22–32)
CO2: 26 mmol/L (ref 22–32)
CREATININE: 1.44 mg/dL — AB (ref 0.44–1.00)
CREATININE: 1.55 mg/dL — AB (ref 0.44–1.00)
Chloride: 120 mmol/L — ABNORMAL HIGH (ref 101–111)
Chloride: 115 mmol/L — ABNORMAL HIGH (ref 101–111)
GFR calc non Af Amer: 31 mL/min — ABNORMAL LOW (ref >60)
GFR, EST AFRICAN AMERICAN: 40 mL/min — AB (ref >60)
GFR, EST AFRICAN AMERICAN: 36 mL/min — AB (ref >60)
GFR, EST NON AFRICAN AMERICAN: 34 mL/min — AB (ref >60)
GLUCOSE: 125 mg/dL — AB (ref 65–99)
Glucose, Bld: 108 mg/dL — ABNORMAL HIGH (ref 65–99)
Potassium: 4 mmol/L (ref 3.5–5.1)
Potassium: 3.5 mmol/L (ref 3.5–5.1)
SODIUM: 149 mmol/L — AB (ref 135–145)
Sodium: 149 mmol/L — ABNORMAL HIGH (ref 135–145)

## 2016-06-14 LAB — BLOOD GAS, ARTERIAL
Acid-base deficit: 3.3 mmol/L — ABNORMAL HIGH (ref 0.0–2.0)
Bicarbonate: 22.2 mmol/L (ref 20.0–28.0)
Drawn by: 406621
FIO2: 50
O2 Saturation: 98.8 %
PATIENT TEMPERATURE: 98.6
PCO2 ART: 46.4 mmHg (ref 32.0–48.0)
PEEP: 5 cmH2O
PO2 ART: 132 mmHg — AB (ref 83.0–108.0)
RATE: 15 resp/min
VT: 350 mL
pH, Arterial: 7.3 — ABNORMAL LOW (ref 7.350–7.450)

## 2016-06-14 LAB — MAGNESIUM
Magnesium: 2.2 mg/dL (ref 1.7–2.4)
Magnesium: 2.1 mg/dL (ref 1.7–2.4)

## 2016-06-14 LAB — H. PYLORI ANTIBODY, IGG

## 2016-06-14 LAB — PHOSPHORUS: PHOSPHORUS: 2.5 mg/dL (ref 2.5–4.6)

## 2016-06-14 LAB — CORTISOL: CORTISOL PLASMA: 28.8 ug/dL

## 2016-06-14 MED ORDER — CHLORHEXIDINE GLUCONATE 0.12% ORAL RINSE (MEDLINE KIT)
15.0000 mL | Freq: Two times a day (BID) | OROMUCOSAL | Status: DC
  Administered 2016-06-14 – 2016-06-18 (×8): 15 mL via OROMUCOSAL

## 2016-06-14 MED ORDER — ETOMIDATE 2 MG/ML IV SOLN
20.0000 mg | Freq: Once | INTRAVENOUS | Status: AC
  Administered 2016-06-14: 20 mg via INTRAVENOUS

## 2016-06-14 MED ORDER — MIDAZOLAM HCL 2 MG/2ML IJ SOLN: 1.0000 mg | INTRAMUSCULAR | Status: DC | PRN

## 2016-06-14 MED ORDER — ORAL CARE MOUTH RINSE
15.0000 mL | Freq: Four times a day (QID) | OROMUCOSAL | Status: DC
  Administered 2016-06-14 – 2016-06-18 (×15): 15 mL via OROMUCOSAL

## 2016-06-14 MED ORDER — ORAL CARE MOUTH RINSE
15.0000 mL | Freq: Four times a day (QID) | OROMUCOSAL | Status: DC
  Administered 2016-06-14: 15 mL via OROMUCOSAL

## 2016-06-14 MED ORDER — FENTANYL CITRATE (PF) 100 MCG/2ML IJ SOLN
50.0000 ug | INTRAMUSCULAR | Status: AC | PRN
  Administered 2016-06-15 – 2016-06-16 (×3): 50 ug via INTRAVENOUS
  Filled 2016-06-14 (×4): qty 2

## 2016-06-14 MED ORDER — FENTANYL CITRATE (PF) 100 MCG/2ML IJ SOLN
INTRAMUSCULAR | Status: AC
  Administered 2016-06-14: 50 ug
  Filled 2016-06-14: qty 2

## 2016-06-14 MED ORDER — FUROSEMIDE 10 MG/ML IJ SOLN
40.0000 mg | Freq: Four times a day (QID) | INTRAMUSCULAR | Status: AC
  Administered 2016-06-14 (×2): 40 mg via INTRAVENOUS
  Filled 2016-06-14 (×2): qty 4

## 2016-06-14 MED ORDER — FENTANYL CITRATE (PF) 100 MCG/2ML IJ SOLN
50.0000 ug | Freq: Once | INTRAMUSCULAR | Status: AC
  Administered 2016-06-14: 50 ug via INTRAVENOUS

## 2016-06-14 MED ORDER — MIDAZOLAM HCL 2 MG/2ML IJ SOLN
INTRAMUSCULAR | Status: AC
  Administered 2016-06-14: 2 mg
  Filled 2016-06-14: qty 2

## 2016-06-14 MED ORDER — MIDAZOLAM HCL 2 MG/2ML IJ SOLN: 2.0000 mg | Freq: Once | INTRAMUSCULAR | Status: AC

## 2016-06-14 MED ORDER — PANTOPRAZOLE SODIUM 40 MG IV SOLR
40.0000 mg | Freq: Two times a day (BID) | INTRAVENOUS | Status: DC
  Administered 2016-06-14 – 2016-06-18 (×9): 40 mg via INTRAVENOUS
  Filled 2016-06-14 (×10): qty 40

## 2016-06-14 MED ORDER — MIDAZOLAM HCL 2 MG/2ML IJ SOLN
1.0000 mg | INTRAMUSCULAR | Status: DC | PRN
  Administered 2016-06-15 – 2016-06-16 (×2): 1 mg via INTRAVENOUS
  Filled 2016-06-14 (×3): qty 2

## 2016-06-14 NOTE — Progress Notes (Signed)
[facility] Progress Note Patient Name: Tracey Nguyen DOB: 1941-08-02 MRN: [id number]   Date of Service  06/14/2016  HPI/Events of Note  Patient being diuresed. No labs for AM. Already has Mg++ and PO4--- levels ordered for the AM.  eICU Interventions  Will order: 1. BMP and Mg++ now. 2. CBC and BMP for the AM.      Intervention Category Minor Interventions: Clinical assessment - ordering diagnostic tests  Lysle Dingwall 06/14/2016, 7:27 PM

## 2016-06-14 NOTE — Procedures (Signed)
Intubation Procedure Note KOTA ZUSMAN [id number] [birth year]  Procedure: Intubation Indications: Airway protection and maintenance  Procedure Details Consent: Unable to obtain consent because of emergent medical necessity. Time Out: Verified patient identification, verified procedure, site/side was marked, verified correct patient position, special equipment/implants available, medications/allergies/relevent history reviewed, required imaging and test results available.  Performed  Drugs Etomidate 20mg , versed, fentanyl DL x 1 with GS 3 blade Grade 4 view> this was an exceeding difficult airway due to massive amounts of flesh/obstruction in her airway making view of the cords difficult 7.5 ET tube passed through cords under direct visualization Placement confirmed with bilateral breath sounds, positive EtCO2 change and smoke in tube   Evaluation Hemodynamic Status: BP stable throughout; O2 sats: stable throughout Patient's Current Condition: stable Complications: No apparent complications Patient did tolerate procedure well. Chest X-ray ordered to verify placement.  CXR: pending.   Tracey Nguyen 06/14/2016

## 2016-06-14 NOTE — Progress Notes (Signed)
PULMONARY / CRITICAL CARE MEDICINE   Name: Tracey Nguyen MRN: [id number] DOB: 18-May-1941    ADMISSION DATE:  06/11/2016  REFERRING MD:  Dr. Jeneen Rinks, ER  CHIEF COMPLAINT:  Weak  brief 75 yo female started feeling weak over the past few days.  She was noted to be tachycardic at rehab.  She called pulmonary office and advised to go to ER for assessment.  She had mild anemia and there was concern for UTI.  She was recently switched from tylenol to [facility] for pain control. Her husband reports that on 11/02 she looked "white as a ghost".  She was also having black stool, but he thought his was from her antibiotic.  She became progressively weaker, and he brought her back to the ER.  She was found to have severe anemia.  She is being given PRBC, and reports symptomatic improvement with transfusion.  She also has been started on protonix gtt, and GI has been called by EDP.  She denies alcohol use or smoking.  She is not aware of stomach ulcers before.  She is not having chest pain or abdominal pain.  She does feel bloated, and reports last BM was on 11/03.  She has PMHx of Asthma, rhinitis, scoliosis with restrictive lung disease, OSA on Bipap, OHS on 2 liters oxygen, Breast cancer, HTN. Recently started using NSAIDs  IMAGING x48h Dg Chest Port 1 View  06/12/2016 Endotracheal tube at the level of the carina with some increased left basilar atelectasis. The tube should be withdrawn 1-2 cm. These results were called by telephone at the time of interpretation on 06/12/2016 at 1:03 pm to [facility], the pts nurse who verbally acknowledged these results.    SIGNIFICANT EVENTS: 11/04 Admit, GI consulted  06/12/16 repeat rounds - GI consult pending. No active bleed per RN. Normotensive got 2 U PRBC per RN hgb 3s to 7. Finishing her night CPAP Rx. Per RN has bad severe OSA (dr Dohmeier  AHI 87)  06/13/16 - Extubated to 3L by   Subjective:  Patient on BiPAP overnight. Denies chest pain, shortness of  breath or other complaints. She asks for ice chips.   VITAL SIGNS: BP (!) 108/56   Pulse 72   Temp 98.2 F (36.8 C) (Oral)   Resp 20   Ht 4\' 10"  (1.473 m)   Wt 82.4 kg (181 lb 11.2 oz)   SpO2 100%   BMI 37.98 kg/m   HEMODYNAMICS:  Not on pressors  VENTILATOR SETTINGS: Vent Mode: BIPAP FiO2 (%):  [40 %] 40 % Set Rate:  [8 bmp-16 bmp] 10 bmp Vt Set:  [350 mL] 350 mL PEEP:  [5 cmH20] 5 cmH20 Pressure Support:  [7 cmH20] 7 cmH20 Plateau Pressure:  [19 cmH20] 19 cmH20  INTAKE / OUTPUT: I/O last 3 completed shifts: In: 5004.8 [P.O.:60; I.V.:4564.8; Blood:330; Other:50] Out: 2500 [Urine:2500]  PHYSICAL EXAMINATION: General: Obese, asks for ice chips Neuro:  Alert,  follows commands. On BiPAP Cardiovascular:  Regular, no murmur Lungs:  No wheeze/rales, or stridors Abdomen:  Obese/ mild distention,  non tender Musculoskeletal:  No edema, Lt wrist in wrap Skin:  No rashes  LABS:  PULMONARY  Recent Labs Lab 06/12/16 0906  PHART 7.412  PCO2ART 37.5  PO2ART 118.0*  HCO3 23.9  TCO2 25  O2SAT 99.0    CBC  Recent Labs Lab 06/11/16 2053 06/12/16 0711  06/12/16 1520 06/12/16 2214 06/13/16 1303  HGB 3.4* 7.0*  < > 8.4* 8.2* 7.8*  HCT 11.1* 21.0*  < >  24.8* 25.2* 23.6*  WBC 21.9* 19.0*  --   --   --  13.0*  PLT 195 180  --   --   --  140*  < > = values in this interval not displayed.  COAGULATION  Recent Labs Lab 06/11/16 2053  INR 1.52    CARDIAC    Recent Labs Lab 06/12/16 0854 06/12/16 1520 06/12/16 2214  TROPONINI 0.06* 0.06* 0.07*   No results for input(s): PROBNP in the last 168 hours.   CHEMISTRY  Recent Labs Lab 06/09/16 1905 06/11/16 2053 06/12/16 0711 06/13/16 0343 06/14/16 0243  NA 142 140 140 145  --   K 4.3 3.5 3.9 3.6  --   CL 101 109 108 115*  --   CO2 30 22 23 24   --   GLUCOSE 161* 112* 117* 119*  --   BUN 71* 116* 114* 68*  --   CREATININE 1.36* 2.39* 2.37* 1.69*  --   CALCIUM 10.0 7.5* 8.3* 8.0*  --   MG  --    --   --  2.1 2.2  PHOS  --   --   --  2.6 2.5   Estimated Creatinine Clearance: 25.7 mL/min (by C-G formula based on SCr of 1.69 mg/dL (H)).   LIVER  Recent Labs Lab 06/09/16 1905 06/11/16 2053  AST 14* 12*  ALT 13* 9*  ALKPHOS 64 37*  BILITOT 0.7 0.4  PROT 6.1* 4.1*  ALBUMIN 3.5 2.4*  INR  --  1.52     INFECTIOUS  Recent Labs Lab 06/09/16 1930 06/09/16 2240 06/11/16 2112  LATICACIDVEN 2.87* 1.14 1.36    ENDOCRINE CBG (last 3)   Recent Labs  06/13/16 1936 06/13/16 2351 06/14/16 0340  GLUCAP 111* 99 102*    ASSESSMENT / PLAN:  Hypotension/Circulatlry shock  2nd to acute upper GI bleeding at admit  Troponin: 0.06>0.06>0.07 likely demand 06/13/16 -needing neo that was started while on diprivan for procedure. No further active bleed  PLAN - transfuse for Hb < 7 or bleeding - Protonix as below - neo for MAP > 65 - maintain NPO  GI bleed - GU bleed and duodenitis. Likely due to NSAIDS -Appreciate GI recs:  -CBC  -Protonix gtt>>IV BID or oral if CBC stable 11/6 and taking PO   -H. Pylori serology  -f/u with Dr. Henrene Pastor outpt   -Outpt colonoscopy - miantain NPO  Chronic hypoxic respiratory failure 2nd to OSA, OHS, scoliosis with restrictive lung disease, asthma. Acute preop resp failure = intubated for procedure>extubated to 3L by [place] on 11/5  PLAN - brovana, pulmicort, duoneb - Bipap 18/14 cm H2O qhs and prn - oxygen to keep SpO2 90 to 95%  AKI likely from volume depletion. Lactic acidosis resolved and AKI  improved  PLAN - continue IVF while NPO NS @75ml /hr - f/u BMET, monitor urine outpt  DVT prophylaxis - SCDs  Goals of care - full code  Disposition: transfer to SDU  Family update: Dr Halford Chessman Updated pt's husband at bedside at admit 06/11/2016 and Dr Chase Caller patient 06/13/2016 No family at bedside this morning.   Wendee Beavers, MD.  PGY-2, [facility] 06/14/2016 6:48 AM  Attending:  I have seen and examined the patient  with nurse practitioner/resident and agree with the note above.  We formulated the plan together and I elicited the following history.    Still on BIPAP since extubation No further bleeding overnight Still on IVF  On exam Increased work of breathing, notable  paradoxical breathing Some wheezing, no clear crackles CV: Tachycardic, regular, no mgr Belly soft, nontender  CXR > two days old, rotated, no clear pulmonary edema   Acute on chronic respiratory failure with hypercarbia > suspect due to pulmonary edema from volume resuscitation; needs intubation this morning, then diuresis after intubation, CXR, ABG GI bleed> not bleeding will change to BID IV pantroprazole Asthma> continue brovana/pulmicort  Husband updated bedside by me  My cc time 32 minutes  Roselie Awkward, MD [place] [facility] Pager: [phone] Cell: [phone] After 3pm or if no response, call [phone]

## 2016-06-15 ENCOUNTER — Inpatient Hospital Stay (HOSPITAL_COMMUNITY): Payer: Medicare Other

## 2016-06-15 DIAGNOSIS — R579 Shock, unspecified: Secondary | ICD-10-CM

## 2016-06-15 LAB — TYPE AND SCREEN
ABO/RH(D): O POS
ANTIBODY SCREEN: NEGATIVE
UNIT DIVISION: 0
UNIT DIVISION: 0
UNIT DIVISION: 0
Unit division: 0

## 2016-06-15 LAB — CBC WITH DIFFERENTIAL/PLATELET
BASOS ABS: 0 10*3/uL (ref 0.0–0.1)
Basophils Relative: 0 %
EOS ABS: 0.2 10*3/uL (ref 0.0–0.7)
Eosinophils Relative: 2 %
HCT: 24.9 % — ABNORMAL LOW (ref 36.0–46.0)
HEMOGLOBIN: 7.8 g/dL — AB (ref 12.0–15.0)
LYMPHS ABS: 1.4 10*3/uL (ref 0.7–4.0)
LYMPHS PCT: 12 %
MCH: 24.8 pg — ABNORMAL LOW (ref 26.0–34.0)
MCHC: 31.3 g/dL (ref 30.0–36.0)
MCV: 79 fL (ref 78.0–100.0)
Monocytes Absolute: 1.2 10*3/uL — ABNORMAL HIGH (ref 0.1–1.0)
Monocytes Relative: 10 %
NEUTROS ABS: 9 10*3/uL — AB (ref 1.7–7.7)
Neutrophils Relative %: 76 %
Platelets: 151 10*3/uL (ref 150–400)
RBC: 3.15 MIL/uL — AB (ref 3.87–5.11)
RDW: 23 % — AB (ref 11.5–15.5)
WBC: 11.8 10*3/uL — AB (ref 4.0–10.5)

## 2016-06-15 LAB — BASIC METABOLIC PANEL
ANION GAP: 11 (ref 5–15)
BUN: 24 mg/dL — ABNORMAL HIGH (ref 6–20)
CALCIUM: 8.7 mg/dL — AB (ref 8.9–10.3)
CO2: 27 mmol/L (ref 22–32)
Chloride: 111 mmol/L (ref 101–111)
Creatinine, Ser: 1.53 mg/dL — ABNORMAL HIGH (ref 0.44–1.00)
GFR calc Af Amer: 37 mL/min — ABNORMAL LOW (ref >60)
GFR, EST NON AFRICAN AMERICAN: 32 mL/min — AB (ref >60)
GLUCOSE: 112 mg/dL — AB (ref 65–99)
Potassium: 3.2 mmol/L — ABNORMAL LOW (ref 3.5–5.1)
SODIUM: 149 mmol/L — AB (ref 135–145)

## 2016-06-15 LAB — PHOSPHORUS: PHOSPHORUS: 2.5 mg/dL (ref 2.5–4.6)

## 2016-06-15 LAB — GLUCOSE, CAPILLARY
GLUCOSE-CAPILLARY: 106 mg/dL — AB (ref 65–99)
GLUCOSE-CAPILLARY: 118 mg/dL — AB (ref 65–99)
Glucose-Capillary: 110 mg/dL — ABNORMAL HIGH (ref 65–99)
Glucose-Capillary: 105 mg/dL — ABNORMAL HIGH (ref 65–99)
Glucose-Capillary: 105 mg/dL — ABNORMAL HIGH (ref 65–99)

## 2016-06-15 LAB — MAGNESIUM: MAGNESIUM: 2 mg/dL (ref 1.7–2.4)

## 2016-06-15 MED ORDER — FREE WATER
200.0000 mL | Freq: Four times a day (QID) | Status: DC
  Administered 2016-06-15 – 2016-06-16 (×4): 200 mL

## 2016-06-15 MED ORDER — VITAL HIGH PROTEIN PO LIQD
1000.0000 mL | ORAL | Status: DC
  Administered 2016-06-15 – 2016-06-18 (×3): 1000 mL

## 2016-06-15 MED ORDER — CLONAZEPAM 0.5 MG PO TABS
1.0000 mg | ORAL_TABLET | Freq: Two times a day (BID) | ORAL | Status: DC
  Administered 2016-06-15 – 2016-06-18 (×7): 1 mg via ORAL
  Filled 2016-06-15 (×7): qty 2

## 2016-06-15 MED ORDER — DEXAMETHASONE 4 MG PO TABS
4.0000 mg | ORAL_TABLET | Freq: Four times a day (QID) | ORAL | Status: DC
  Administered 2016-06-15 – 2016-06-18 (×12): 4 mg
  Filled 2016-06-15 (×13): qty 1

## 2016-06-15 MED ORDER — POTASSIUM CHLORIDE 20 MEQ/15ML (10%) PO SOLN
40.0000 meq | Freq: Once | ORAL | Status: AC
  Administered 2016-06-15: 40 meq
  Filled 2016-06-15: qty 30

## 2016-06-15 NOTE — Progress Notes (Signed)
[facility] ADULT ICU REPLACEMENT PROTOCOL FOR AM LAB REPLACEMENT ONLY  The patient does not apply for the [facility] Adult ICU Electrolyte Replacment Protocol based on the criteria listed below:    Is GFR >/= 40 ml/min? No.  Patient's GFR today is 32   Abnormal electrolyte(s): K3.2   If a panic level lab has been reported, has the CCM MD in charge been notified? Yes.  .   Physician:  Calton Dach, MD  [facility] 06/15/2016 3:57 AM

## 2016-06-15 NOTE — Progress Notes (Signed)
PULMONARY / CRITICAL CARE MEDICINE   Name: Tracey Nguyen MRN: [id number] DOB: 1941/07/27    ADMISSION DATE:  06/11/2016  REFERRING MD:  Dr. Jeneen Rinks, ER  CHIEF COMPLAINT:  Weak  brief 75 yo female started feeling weak over the past few days.  She was noted to be tachycardic at rehab.  She called pulmonary office and advised to go to ER for assessment.  She had mild anemia and there was concern for UTI.  She was recently switched from tylenol to [facility] for pain control. Her husband reports that on 11/02 she looked "white as a ghost".  She was also having black stool, but he thought his was from her antibiotic.  She became progressively weaker, and he brought her back to the ER.  She was found to have severe anemia.  She is being given PRBC, and reports symptomatic improvement with transfusion.  She also has been started on protonix gtt, and GI has been called by EDP.  She denies alcohol use or smoking.  She is not aware of stomach ulcers before.  She is not having chest pain or abdominal pain.  She does feel bloated, and reports last BM was on 11/03.  She has PMHx of Asthma, rhinitis, scoliosis with restrictive lung disease, OSA on Bipap, OHS on 2 liters oxygen, Breast cancer, HTN. Recently started using NSAIDs  IMAGING x48h Dg Chest Port 1 View  06/12/2016 Endotracheal tube at the level of the carina with some increased left basilar atelectasis. The tube should be withdrawn 1-2 cm. These results were called by telephone at the time of interpretation on 06/12/2016 at 1:03 pm to [facility], the pts nurse who verbally acknowledged these results.    SIGNIFICANT EVENTS: 11/04 Admit, GI consulted  06/12/16 repeat rounds - GI consult pending. No active bleed per RN. Normotensive got 2 U PRBC per RN hgb 3s to 7. Finishing her night CPAP Rx. Per RN has bad severe OSA (dr Dohmeier  AHI 87)  06/13/16 - Extubated to 3L by [place]  Subjective:  Reports throat pain this morning. Denies chest pain or other  problem  VITAL SIGNS: BP (!) 93/56   Pulse 63   Temp 98.5 F (36.9 C) (Oral)   Resp 15   Ht 4\' 10"  (1.473 m)   Wt 82.5 kg (181 lb 14.1 oz)   SpO2 100%   BMI 38.01 kg/m   HEMODYNAMICS:  Not on pressors  VENTILATOR SETTINGS: Vent Mode: PRVC FiO2 (%):  [40 %-50 %] 40 % Set Rate:  [15 bmp] 15 bmp Vt Set:  [350 mL] 350 mL PEEP:  [5 cmH20] 5 cmH20 Plateau Pressure:  [18 cmH20-22 cmH20] 22 cmH20  INTAKE / OUTPUT: I/O last 3 completed shifts: In: 2440 [I.V.:2195; NG/GT:245] Out: 5070 [Urine:5070]  PHYSICAL EXAMINATION: General: Obese  Neuro:  Alert, awake and answers questions by nodding her head.  Cardiovascular:  Regular, no murmur Lungs:  On PRVC 15/350/40/5 Abdomen:  Obese/ mild distention,  non tender Musculoskeletal:  No edema, Lt wrist in wrap Skin:  No rashes  LABS:  PULMONARY  Recent Labs Lab 06/12/16 0906 06/14/16 1254  PHART 7.412 7.300*  PCO2ART 37.5 46.4  PO2ART 118.0* 132*  HCO3 23.9 22.2  TCO2 25  --   O2SAT 99.0 98.8    CBC  Recent Labs Lab 06/13/16 1303 06/14/16 0836 06/15/16 0242  HGB 7.8* 7.6* 7.8*  HCT 23.6* 24.6* 24.9*  WBC 13.0* 11.2* 11.8*  PLT 140* 126* 151    COAGULATION  Recent  Labs Lab 06/11/16 2053  INR 1.52    CARDIAC    Recent Labs Lab 06/12/16 0854 06/12/16 1520 06/12/16 2214  TROPONINI 0.06* 0.06* 0.07*   No results for input(s): PROBNP in the last 168 hours.   CHEMISTRY  Recent Labs Lab 06/12/16 0711 06/13/16 0343 06/14/16 0243 06/14/16 0836 06/14/16 1954 06/15/16 0242  NA 140 145  --  149* 149* 149*  K 3.9 3.6  --  4.0 3.5 3.2*  CL 108 115*  --  120* 115* 111  CO2 23 24  --  24 26 27   GLUCOSE 117* 119*  --  108* 125* 112*  BUN 114* 68*  --  31* 27* 24*  CREATININE 2.37* 1.69*  --  1.44* 1.55* 1.53*  CALCIUM 8.3* 8.0*  --  8.3* 8.7* 8.7*  MG  --  2.1 2.2  --  2.1 2.0  PHOS  --  2.6 2.5  --   --  2.5   Estimated Creatinine Clearance: 28.4 mL/min (by C-G formula based on SCr of 1.53  mg/dL (H)).   LIVER  Recent Labs Lab 06/09/16 1905 06/11/16 2053  AST 14* 12*  ALT 13* 9*  ALKPHOS 64 37*  BILITOT 0.7 0.4  PROT 6.1* 4.1*  ALBUMIN 3.5 2.4*  INR  --  1.52     INFECTIOUS  Recent Labs Lab 06/09/16 1930 06/09/16 2240 06/11/16 2112  LATICACIDVEN 2.87* 1.14 1.36    ENDOCRINE CBG (last 3)   Recent Labs  06/14/16 1540 06/14/16 2025 06/15/16 0012  GLUCAP 105* 112* 106*    ASSESSMENT / PLAN:  Hypotension/Circulatlry shock  2nd to acute upper GI bleeding at admit  Troponin: 0.06>0.06>0.07 likely demand 06/13/16 -needing neo that was started while on diprivan for procedure.  No further active bleed  PLAN - transfuse for Hb < 7 or bleeding - Protonix as below - neo for MAP > 65 - maintain NPO  GI bleed - GU bleed and duodenitis. Likely due to NSAIDS -Appreciate GI recs:  -CBC  -IV Protonix twice a day   -H. Pylori serology  -f/u with Dr. Henrene Pastor outpt   -Outpt colonoscopy - miantain NPO. TF if SBT unsuccessful.   Chronic hypoxic respiratory failure 2nd to OSA, OHS, scoliosis with restrictive lung disease, asthma. Acute preop resp failure = intubated for EGD>extubated to 3L by [place] on 11/5 Reintubated after IWOB on BiPAP on 11/6  PLAN -SBT this morning - brovana, pulmicort, duoneb - PRVC 15/350/40/5 - Daily SAT  CKD sCr 1.53 b/l ~1.4 Lactic acidosis resolved Contraction alkalosis after aggressive diuresis HypoK  PLAN -Repleting K -BMET, monitor urine outpt  DVT prophylaxis - SCDs  Goals of care - full code   Family update:  Updated pt's husband at bedside 11/6  Wendee Beavers, MD.  PGY-2, [place] [facility] 06/15/2016 7:33 AM  Attending:  I have seen and examined the patient with nurse practitioner/resident and agree with the note above.  We formulated the plan together and I elicited the following history.    Resting comfortably No cuff leak on exam  Lungs clear, normal effort Anxious Trace ankle  edema  Upper airway edema> noted on exam with intubation 11/6, plan decadron, continue ETT, check for cuff leak q shift Acute respiratory failure with hypoxemia> improved, hold lasix today, continue vent support until airway edema resolved Hypernatremia> add free water today  My cc time 30 minutes  Roselie Awkward, MD [place] [facility] Pager: [phone] Cell: [phone] After 3pm or if no response, call  [phone]   

## 2016-06-15 NOTE — Progress Notes (Signed)
[facility] Progress Note Patient Name: Tracey Nguyen DOB: [birth year] MRN: [id number]   Date of Service  06/15/2016  HPI/Events of Note  Potassium 3.2. Creatinine 1.5. Enteric feeding tube in place.   eICU Interventions  KCl 40 mEq via tube 1      Intervention Category Intermediate Interventions: Electrolyte abnormality - evaluation and management  Tera Partridge 06/15/2016, 3:58 AM

## 2016-06-15 NOTE — Progress Notes (Signed)
Initial Nutrition Assessment  DOCUMENTATION CODES:   Obesity unspecified  INTERVENTION:   - Initiate Vital High Protein @ 45 mL/hr (1080 mL) to provide 1080 kcal (100% estimated energy needs), 95 grams protein (100% estimated protein needs), and 907 mL free water daily. - Free water flushes per MD.  NUTRITION DIAGNOSIS:   Inadequate oral intake related to inability to eat as evidenced by NPO status.  GOAL:   Provide needs based on ASPEN/SCCM guidelines  MONITOR:   Vent status, Labs, Weight trends, TF tolerance, Skin  REASON FOR ASSESSMENT:   Consult, Vent Enteral/tube feeding initiation and management  ASSESSMENT:   75 yo female started feeling weak over the past few days. She was noted to be tachycardic at rehab. She called pulmonary office and advised to go to ER for assessment. She had mild anemia and there was concern for UTI. She was also having black stool, but he thought his was from her antibiotic. She became progressively weaker, and he brought her back to the ER. She was found to have severe anemia. She is being given PRBC, and reports symptomatic improvement with transfusion.  Pt intubated on 06/12/16 for upper GI endoscopy. Extubated 06/13/16 but re-intubated on 06/14/16. Pt with AKI and upper GI bleed.  Pt is currently intubated on ventilator support. Pt with OG tube extending to distal stomach with tip projecting in left upper quadrant. MV: 6.2 L/min Highest temperature in 24 hours: 37.3 degrees Celsius BP (MAP): 108/70 (82)  Free water flushes of 200 mL every 6 hours are ordered.  Will initiate TF of Vital High Protein as described in intervention.  Medications reviewed and include 1 mg Klonopin BID, 40 mg Protonix BID, IV neo-synephrine in D5  Labs reviewed and include elevated sodium (149 mmol/L), low potassium (3.2 mmol/L), elevated BUN (24 mg/dL), elevated creatinine (1.53 mg/dL), low calcium (8.7 mg/dL), low hemoglobin (7.8 g/dL), phosphorus and magnesium  WNL CBG's: 105-110 mg/dL  NFPE: Exam completed. No fat depletion, no muscle depletion, and mild edema noted.  Diet Order:  Diet NPO time specified  Skin:  Reviewed, no issues (MSAD to breast)  Last BM:  06/13/16  Height:   Ht Readings from Last 1 Encounters:  06/12/16 4\' 10"  (1.473 m)    Weight:   Wt Readings from Last 1 Encounters:  06/15/16 181 lb 14.1 oz (82.5 kg)    Ideal Body Weight:  40.9 kg  BMI:  Body mass index is 38.01 kg/m.  Estimated Nutritional Needs:   Kcal:  [phone] kcal (11-14 kcal/kg(  Protein:  >/= 82 g (>/= 2.0 g/kg IBW)  Fluid:  >/= 1.5 L/day  EDUCATION NEEDS:   No education needs identified at this time  Jeb Levering Dietetic Intern Pager Number: [phone]

## 2016-06-15 NOTE — Progress Notes (Signed)
Cuff test performed per MD, with no audible leak heardm. MD at the bedside and aware. No extubation today per MD. RT will continue to monitor.

## 2016-06-15 NOTE — Progress Notes (Signed)
Per Dr Lake Bells, Tracey Nguyen is ok and does not need to be pulled back. ETT secured at 22 at lip. RT will continue to monitor.

## 2016-06-16 ENCOUNTER — Inpatient Hospital Stay (HOSPITAL_COMMUNITY): Payer: Medicare Other

## 2016-06-16 DIAGNOSIS — J988 Other specified respiratory disorders: Secondary | ICD-10-CM

## 2016-06-16 LAB — CBC WITH DIFFERENTIAL/PLATELET
BASOS ABS: 0 10*3/uL (ref 0.0–0.1)
Basophils Relative: 0 %
EOS ABS: 0 10*3/uL (ref 0.0–0.7)
Eosinophils Relative: 0 %
HCT: 24.4 % — ABNORMAL LOW (ref 36.0–46.0)
HEMOGLOBIN: 7.7 g/dL — AB (ref 12.0–15.0)
LYMPHS PCT: 7 %
Lymphs Abs: 0.7 10*3/uL (ref 0.7–4.0)
MCH: 24.9 pg — ABNORMAL LOW (ref 26.0–34.0)
MCHC: 31.6 g/dL (ref 30.0–36.0)
MCV: 79 fL (ref 78.0–100.0)
Monocytes Absolute: 0.4 10*3/uL (ref 0.1–1.0)
Monocytes Relative: 4 %
NEUTROS ABS: 8.4 10*3/uL — AB (ref 1.7–7.7)
NEUTROS PCT: 89 %
Platelets: 124 10*3/uL — ABNORMAL LOW (ref 150–400)
RBC: 3.09 MIL/uL — ABNORMAL LOW (ref 3.87–5.11)
RDW: 23.1 % — ABNORMAL HIGH (ref 11.5–15.5)
WBC: 9.5 10*3/uL (ref 4.0–10.5)

## 2016-06-16 LAB — GLUCOSE, CAPILLARY
GLUCOSE-CAPILLARY: 200 mg/dL — AB (ref 65–99)
Glucose-Capillary: 166 mg/dL — ABNORMAL HIGH (ref 65–99)

## 2016-06-16 LAB — BASIC METABOLIC PANEL
ANION GAP: 9 (ref 5–15)
BUN: 33 mg/dL — ABNORMAL HIGH (ref 6–20)
CALCIUM: 9.1 mg/dL (ref 8.9–10.3)
CO2: 26 mmol/L (ref 22–32)
Chloride: 110 mmol/L (ref 101–111)
Creatinine, Ser: 1.35 mg/dL — ABNORMAL HIGH (ref 0.44–1.00)
GFR, EST AFRICAN AMERICAN: 43 mL/min — AB (ref >60)
GFR, EST NON AFRICAN AMERICAN: 37 mL/min — AB (ref >60)
Glucose, Bld: 175 mg/dL — ABNORMAL HIGH (ref 65–99)
Potassium: 4 mmol/L (ref 3.5–5.1)
SODIUM: 145 mmol/L (ref 135–145)

## 2016-06-16 LAB — MAGNESIUM: MAGNESIUM: 2.3 mg/dL (ref 1.7–2.4)

## 2016-06-16 LAB — PHOSPHORUS: PHOSPHORUS: 2.5 mg/dL (ref 2.5–4.6)

## 2016-06-16 MED ORDER — INSULIN ASPART 100 UNIT/ML ~~LOC~~ SOLN: 1.0000 [IU] | SUBCUTANEOUS | Status: DC

## 2016-06-16 MED ORDER — FUROSEMIDE 10 MG/ML IJ SOLN
40.0000 mg | Freq: Once | INTRAMUSCULAR | Status: AC
  Administered 2016-06-16: 40 mg via INTRAVENOUS
  Filled 2016-06-16: qty 4

## 2016-06-16 MED ORDER — FENTANYL CITRATE (PF) 100 MCG/2ML IJ SOLN
25.0000 ug | INTRAMUSCULAR | Status: DC | PRN
  Administered 2016-06-16 – 2016-06-18 (×14): 50 ug via INTRAVENOUS
  Filled 2016-06-16 (×15): qty 2

## 2016-06-16 MED ORDER — FUROSEMIDE 10 MG/ML IJ SOLN
40.0000 mg | Freq: Every day | INTRAMUSCULAR | Status: DC
  Administered 2016-06-17: 40 mg via INTRAVENOUS
  Filled 2016-06-16 (×2): qty 4

## 2016-06-16 MED ORDER — LORAZEPAM 2 MG/ML IJ SOLN
1.0000 mg | Freq: Once | INTRAMUSCULAR | Status: AC
  Administered 2016-06-16: 1 mg via INTRAVENOUS
  Filled 2016-06-16: qty 1

## 2016-06-16 NOTE — Procedures (Signed)
Pt has negative cuff leak at this time, MD aware.  Increased PS to 10 due to decreased VT's 200 & inc WOB, RT will continue to monitor.

## 2016-06-16 NOTE — Progress Notes (Signed)
PULMONARY / CRITICAL CARE MEDICINE   Name: Tracey Nguyen MRN: [id number] DOB: 1941-06-12    ADMISSION DATE:  06/11/2016  REFERRING MD:  Dr. Jeneen Rinks, ER  CHIEF COMPLAINT:  Weak  brief 75 yo female started feeling weak over the past few days.  She was noted to be tachycardic at rehab.  She called pulmonary office and advised to go to ER for assessment.  She had mild anemia and there was concern for UTI.  She was recently switched from tylenol to [facility] for pain control. Her husband reports that on 11/02 she looked "white as a ghost".  She was also having black stool, but he thought his was from her antibiotic.  She became progressively weaker, and he brought her back to the ER.  She was found to have severe anemia.  She is being given PRBC, and reports symptomatic improvement with transfusion.  She also has been started on protonix gtt, and GI has been called by EDP.  She denies alcohol use or smoking.  She is not aware of stomach ulcers before.  She is not having chest pain or abdominal pain.  She does feel bloated, and reports last BM was on 11/03.  She has PMHx of Asthma, rhinitis, scoliosis with restrictive lung disease, OSA on Bipap, OHS on 2 liters oxygen, Breast cancer, HTN. Recently started using NSAIDs  IMAGING x48h Dg Chest Port 1 View  06/12/2016: Endotracheal tube at the level of the carina with some increased left basilar atelectasis. The tube should be withdrawn 1-2 cm.   06/14/16: Pulm edema  06/15/16: Pulm edema improved  06/16/16: Pulm edema again.  ETT in right bronchus   SIGNIFICANT EVENTS: 11/04 Admit, GI consulted  06/12/16 repeat rounds - GI consult pending. No active bleed per RN. Normotensive got 2 U PRBC per RN hgb 3s to 7. Finishing her night CPAP Rx. Per RN has bad severe OSA (dr Dohmeier  AHI 87)  06/13/16 - Extubated to 3L by [place]  06/14/16 - reintubated due to increased work of breathing  06/15/16- SBT. No cuff leak on exam. Decadron by tube  Subjective:   Reports throat pain this morning again. On PRVC overnight. Denies chest pain or other problem  VITAL SIGNS: BP 101/66   Pulse 68   Temp 99 F (37.2 C) (Oral)   Resp 17   Ht 4\' 10"  (1.473 m)   Wt 82.5 kg (181 lb 14.1 oz)   SpO2 100%   BMI 38.01 kg/m   HEMODYNAMICS:  Not on pressors  VENTILATOR SETTINGS: Vent Mode: PRVC FiO2 (%):  [30 %-40 %] 40 % Set Rate:  [15 bmp] 15 bmp Vt Set:  [350 mL] 350 mL PEEP:  [5 cmH20] 5 cmH20 Pressure Support:  [10 cmH20] 10 cmH20 Plateau Pressure:  [18 cmH20-22 cmH20] 19 cmH20  INTAKE / OUTPUT: I/O last 3 completed shifts: In: 1967.5 [I.V.:792.5; NG/GT:1175] Out: 3190 [Urine:2990; Emesis/NG output:200]  PHYSICAL EXAMINATION: General: Obese  Neuro:  Alert, awake and answers questions by nodding her head.  Cardiovascular:  Regular, no murmur Lungs:  On PRVC 15/350/40/5 Abdomen:  Obese/ mild distention,  non tender Musculoskeletal:  No edema, Lt wrist in wrap Skin:  No rashes  LABS:  PULMONARY  Recent Labs Lab 06/12/16 0906 06/14/16 1254  PHART 7.412 7.300*  PCO2ART 37.5 46.4  PO2ART 118.0* 132*  HCO3 23.9 22.2  TCO2 25  --   O2SAT 99.0 98.8    CBC  Recent Labs Lab 06/14/16 0836 06/15/16 0242 06/16/16 0711  HGB 7.6* 7.8* 7.7*  HCT 24.6* 24.9* 24.4*  WBC 11.2* 11.8* 9.5  PLT 126* 151 124*    COAGULATION  Recent Labs Lab 06/11/16 2053  INR 1.52    CARDIAC    Recent Labs Lab 06/12/16 0854 06/12/16 1520 06/12/16 2214  TROPONINI 0.06* 0.06* 0.07*   No results for input(s): PROBNP in the last 168 hours.   CHEMISTRY  Recent Labs Lab 06/13/16 0343 06/14/16 0243 06/14/16 0836 06/14/16 1954 06/15/16 0242 06/16/16 0226 06/16/16 0711  NA 145  --  149* 149* 149*  --  145  K 3.6  --  4.0 3.5 3.2*  --  4.0  CL 115*  --  120* 115* 111  --  110  CO2 24  --  24 26 27   --  26  GLUCOSE 119*  --  108* 125* 112*  --  175*  BUN 68*  --  31* 27* 24*  --  33*  CREATININE 1.69*  --  1.44* 1.55* 1.53*  --   1.35*  CALCIUM 8.0*  --  8.3* 8.7* 8.7*  --  9.1  MG 2.1 2.2  --  2.1 2.0 2.3  --   PHOS 2.6 2.5  --   --  2.5 2.5  --    Estimated Creatinine Clearance: 32.2 mL/min (by C-G formula based on SCr of 1.35 mg/dL (H)).   LIVER  Recent Labs Lab 06/09/16 1905 06/11/16 2053  AST 14* 12*  ALT 13* 9*  ALKPHOS 64 37*  BILITOT 0.7 0.4  PROT 6.1* 4.1*  ALBUMIN 3.5 2.4*  INR  --  1.52     INFECTIOUS  Recent Labs Lab 06/09/16 1930 06/09/16 2240 06/11/16 2112  LATICACIDVEN 2.87* 1.14 1.36    ENDOCRINE CBG (last 3)   Recent Labs  06/15/16 0841 06/15/16 1148 06/15/16 1729  GLUCAP 105* 105* 118*    ASSESSMENT / PLAN: Resp A: Chronic hypoxic respiratory failure 2nd to OSA, OHS, scoliosis with restrictive lung disease, asthma. Acute preop resp failure = intubated for EGD>extubated to 3L by [place] on 11/5 Reintubated after IWOB on BiPAP on 11/6 SBT 11/7 to 10/5. No cuff leak on exam Diuresed well on 11/6. CXR improved then worsened 11/8 PRVC over night P: - Lasix IV 40 mg once - SBT after lasix - brovana, pulmicort, duoneb - Decadron by tube 11/7>  CVS:  A: Hypovolemic shock 2nd to acute upper GIB at admit  Troponin: 0.06>0.06>0.07 likely demand GIB stopped  P: - transfuse for Hb < 7 or bleeding - Protonix as below - neo for MAP > 65  GI A: GI bleed - GU bleed due to NSAIDS EGD with esophagitis and gastritis GIB stopped. Hgb stable A -Appreciate GI recs:  -daily CBC  -IV Protonix twice a day   -H. Pylori serology  -f/u with Dr. Henrene Pastor outpt   -Outpt colonoscopy  Hem: A: Anemia of GIB GIB stopped Hgb 9.2>3.4>3units>8.3>>7.7  P: Daily CBC PPI for GIB SCD for VTE  Renal A: CKD sCr 1.35 b/l ~1.4 Lactic acidosis resolved Contraction alkalosis after aggressive diuresis HypoK: resolved HyperNa: resolved  P: -Replete as need -BMET, monitor urine outpt -D/cd free water at 200cc/hr  Neuro: A: No issues P: Continue monitoring  Goals of care  - full code  Family update:  Updated pt's husband at bedside 11/6. Will update when available  Wendee Beavers, MD.  PGY-2, [facility] 06/16/2016 8:38 AM  Attending:  I have seen and examined the patient with  nurse practitioner/resident and agree with the note above.  We formulated the plan together and I elicited the following history.    SBT yesterday tolerated well but no cuff leak on exam Rested on full vent support overnight  On exam:  Anxious Lungs with wheezing vent supported breaths RRR, no mgr Belly soft non-tender   Acute respiratory failure with hypoxemia> pull back ett, give lasix today Hypernatremia> improved GI bleed> no evidence of bleeding OSA> will need CPAP qHS after extubation Airway inflammation/narrowing> continue decadron  My cc time 32 minutes  I updated husband bedside  Roselie Awkward, MD [facility] Pager: [phone] Cell: [phone] After 3pm or if no response, call [phone]

## 2016-06-16 NOTE — Procedures (Signed)
ETT pulled back 3cm per MD order, now 19 @ lip

## 2016-06-16 NOTE — Care Management Important Message (Signed)
Important Message  Patient Details  Name: JAKAI ROYALTY MRN: [id number] Date of Birth: 11/18/1940   Medicare Important Message Given:  Yes    Ranie Chinchilla Abena 06/16/2016, 10:40 AM

## 2016-06-16 NOTE — Progress Notes (Signed)
Pt placed back on full support at this time due to increased agitation, will try PS trial again later today as tolerated

## 2016-06-17 ENCOUNTER — Inpatient Hospital Stay (HOSPITAL_COMMUNITY): Payer: Medicare Other

## 2016-06-17 ENCOUNTER — Encounter (HOSPITAL_COMMUNITY)
Admission: EM | Disposition: A | Discharge status: Other Acute Inpatient Hospital | Payer: Self-pay | Source: Home / Self Care | Attending: Internal Medicine

## 2016-06-17 ENCOUNTER — Inpatient Hospital Stay (HOSPITAL_COMMUNITY): Payer: Medicare Other | Admitting: Certified Registered Nurse Anesthetist

## 2016-06-17 ENCOUNTER — Ambulatory Visit: Payer: Self-pay | Admitting: Otolaryngology

## 2016-06-17 HISTORY — PX: TRACHEOSTOMY TUBE PLACEMENT: SHX814

## 2016-06-17 LAB — CBC WITH DIFFERENTIAL/PLATELET
BASOS ABS: 0 10*3/uL (ref 0.0–0.1)
Basophils Relative: 0 %
EOS ABS: 0 10*3/uL (ref 0.0–0.7)
Eosinophils Relative: 0 %
HCT: 24.1 % — ABNORMAL LOW (ref 36.0–46.0)
Hemoglobin: 7.6 g/dL — ABNORMAL LOW (ref 12.0–15.0)
Lymphocytes Relative: 5 %
Lymphs Abs: 0.5 10*3/uL — ABNORMAL LOW (ref 0.7–4.0)
MCH: 24.5 pg — ABNORMAL LOW (ref 26.0–34.0)
MCHC: 31.5 g/dL (ref 30.0–36.0)
MCV: 77.7 fL — ABNORMAL LOW (ref 78.0–100.0)
MONO ABS: 0.8 10*3/uL (ref 0.1–1.0)
Monocytes Relative: 7 %
NEUTROS PCT: 88 %
Neutro Abs: 9.4 10*3/uL — ABNORMAL HIGH (ref 1.7–7.7)
PLATELETS: 153 10*3/uL (ref 150–400)
RBC: 3.1 MIL/uL — AB (ref 3.87–5.11)
RDW: 23.1 % — ABNORMAL HIGH (ref 11.5–15.5)
WBC: 10.7 10*3/uL — AB (ref 4.0–10.5)

## 2016-06-17 LAB — MAGNESIUM: MAGNESIUM: 2.2 mg/dL (ref 1.7–2.4)

## 2016-06-17 LAB — BASIC METABOLIC PANEL
ANION GAP: 6 (ref 5–15)
BUN: 44 mg/dL — ABNORMAL HIGH (ref 6–20)
CALCIUM: 9.2 mg/dL (ref 8.9–10.3)
CHLORIDE: 109 mmol/L (ref 101–111)
CO2: 30 mmol/L (ref 22–32)
CREATININE: 1.38 mg/dL — AB (ref 0.44–1.00)
GFR calc non Af Amer: 36 mL/min — ABNORMAL LOW (ref >60)
GFR, EST AFRICAN AMERICAN: 42 mL/min — AB (ref >60)
Glucose, Bld: 169 mg/dL — ABNORMAL HIGH (ref 65–99)
Potassium: 3.8 mmol/L (ref 3.5–5.1)
SODIUM: 145 mmol/L (ref 135–145)

## 2016-06-17 LAB — PHOSPHORUS: PHOSPHORUS: 3.1 mg/dL (ref 2.5–4.6)

## 2016-06-17 SURGERY — CREATION, TRACHEOSTOMY
Anesthesia: General | Site: Neck

## 2016-06-17 MED ORDER — SODIUM CHLORIDE 0.9 % IR SOLN
Status: DC | PRN
  Administered 2016-06-17: 1000 mL

## 2016-06-17 MED ORDER — ARTIFICIAL TEARS OP OINT
TOPICAL_OINTMENT | OPHTHALMIC | Status: DC | PRN
  Administered 2016-06-17: 1 via OPHTHALMIC

## 2016-06-17 MED ORDER — LACTATED RINGERS IV SOLN
INTRAVENOUS | Status: DC | PRN
  Administered 2016-06-17: 20:00:00 via INTRAVENOUS

## 2016-06-17 MED ORDER — MIDAZOLAM HCL 5 MG/5ML IJ SOLN
INTRAMUSCULAR | Status: DC | PRN
  Administered 2016-06-17: 2 mg via INTRAVENOUS

## 2016-06-17 MED ORDER — ROCURONIUM BROMIDE 100 MG/10ML IV SOLN
INTRAVENOUS | Status: DC | PRN
  Administered 2016-06-17: 20 mg via INTRAVENOUS
  Administered 2016-06-17: 40 mg via INTRAVENOUS

## 2016-06-17 MED ORDER — MIDAZOLAM HCL 2 MG/2ML IJ SOLN
INTRAMUSCULAR | Status: AC
  Filled 2016-06-17: qty 2

## 2016-06-17 MED ORDER — PHENYLEPHRINE HCL 10 MG/ML IJ SOLN
INTRAVENOUS | Status: DC | PRN
  Administered 2016-06-17: 50 ug/min via INTRAVENOUS

## 2016-06-17 MED ORDER — FENTANYL CITRATE (PF) 100 MCG/2ML IJ SOLN
INTRAMUSCULAR | Status: DC | PRN
  Administered 2016-06-17 (×2): 50 ug via INTRAVENOUS

## 2016-06-17 MED ORDER — FENTANYL CITRATE (PF) 100 MCG/2ML IJ SOLN
INTRAMUSCULAR | Status: AC
  Filled 2016-06-17: qty 2

## 2016-06-17 SURGICAL SUPPLY — 37 items
APL SKNCLS STERI-STRIP NONHPOA (GAUZE/BANDAGES/DRESSINGS)
BENZOIN TINCTURE PRP APPL 2/3 (GAUZE/BANDAGES/DRESSINGS) IMPLANT
BLADE SURG 15 STRL LF DISP TIS (BLADE) IMPLANT
BLADE SURG 15 STRL SS (BLADE)
BLADE SURG ROTATE 9660 (MISCELLANEOUS) IMPLANT
CANISTER SUCTION 2500CC (MISCELLANEOUS) ×3 IMPLANT
CLEANER TIP ELECTROSURG 2X2 (MISCELLANEOUS) ×3 IMPLANT
COVER SURGICAL LIGHT HANDLE (MISCELLANEOUS) ×3 IMPLANT
DECANTER SPIKE VIAL GLASS SM (MISCELLANEOUS) ×3 IMPLANT
DRAPE PROXIMA HALF (DRAPES) IMPLANT
ELECT COATED BLADE 2.86 ST (ELECTRODE) ×3 IMPLANT
ELECT REM PT RETURN 9FT ADLT (ELECTROSURGICAL) ×3
ELECTRODE REM PT RTRN 9FT ADLT (ELECTROSURGICAL) ×1 IMPLANT
GAUZE SPONGE 4X4 16PLY XRAY LF (GAUZE/BANDAGES/DRESSINGS) ×3 IMPLANT
GLOVE ECLIPSE 7.5 STRL STRAW (GLOVE) ×3 IMPLANT
GOWN STRL REUS W/ TWL LRG LVL3 (GOWN DISPOSABLE) ×2 IMPLANT
GOWN STRL REUS W/TWL LRG LVL3 (GOWN DISPOSABLE) ×6
KIT BASIN OR (CUSTOM PROCEDURE TRAY) ×3 IMPLANT
KIT ROOM TURNOVER OR (KITS) ×3 IMPLANT
NDL PRECISIONGLIDE 27X1.5 (NEEDLE) ×1 IMPLANT
NEEDLE PRECISIONGLIDE 27X1.5 (NEEDLE) ×3 IMPLANT
NS IRRIG 1000ML POUR BTL (IV SOLUTION) ×3 IMPLANT
PACK EENT II TURBAN DRAPE (CUSTOM PROCEDURE TRAY) ×3 IMPLANT
PAD ARMBOARD 7.5X6 YLW CONV (MISCELLANEOUS) ×6 IMPLANT
PENCIL FOOT CONTROL (ELECTRODE) ×3 IMPLANT
SPONGE GAUZE 4X4 12PLY STER LF (GAUZE/BANDAGES/DRESSINGS) ×2 IMPLANT
SUT CHROMIC 2 0 SH (SUTURE) ×3 IMPLANT
SUT ETHILON 3 0 PS 1 (SUTURE) ×3 IMPLANT
SUT SILK 4 0 TIE 10X30 (SUTURE) ×3 IMPLANT
SUT SILK 4 0 TIES 17X18 (SUTURE) ×3 IMPLANT
SYR 20CC LL (SYRINGE) ×3 IMPLANT
SYR CONTROL 10ML LL (SYRINGE) IMPLANT
TOWEL OR 17X24 6PK STRL BLUE (TOWEL DISPOSABLE) ×3 IMPLANT
TUBE CONNECTING 12'X1/4 (SUCTIONS) ×1
TUBE CONNECTING 12X1/4 (SUCTIONS) ×2 IMPLANT
TUBE TRACH MID RANGE 7MM (TUBING) ×2 IMPLANT
WATER STERILE IRR 1000ML POUR (IV SOLUTION) ×3 IMPLANT

## 2016-06-17 NOTE — Progress Notes (Signed)
[facility] consulted. Talked to [facility]. Dr. Constance Holster to see patient per [facility].

## 2016-06-17 NOTE — Transfer of Care (Signed)
Immediate Anesthesia Transfer of Care Note  Patient: Tracey Nguyen  Procedure(s) Performed: Procedure(s): TRACHEOSTOMY (N/A)  Patient Location: ICU  Anesthesia Type:General  Level of Consciousness: sedated, unresponsive and Patient remains intubated per anesthesia plan  Airway & Oxygen Therapy: Patient remains intubated per anesthesia plan and Patient placed on Ventilator (see vital sign flow sheet for setting)  Post-op Assessment: Report given to RN and Post -op Vital signs reviewed and stable  Post vital signs: Reviewed and stable  Last Vitals:  Vitals:   06/17/16 1930 06/17/16 2000  BP:  100/61  Pulse:  74  Resp:  18  Temp: 37.3 C     Last Pain:  Vitals:   06/17/16 1930  TempSrc: Oral  PainSc:          Complications: No apparent anesthesia complications

## 2016-06-17 NOTE — Progress Notes (Signed)
Patient returned from OR with 53mm Portex Trach. RT hooked up ventilator to patient. Patient is resting comfortably on full support at this time. RT will continue to monitor as needed.

## 2016-06-17 NOTE — Care Management Note (Addendum)
Case Management Note  Patient Details  Name: Tracey Nguyen MRN: [id number] Date of Birth: 1940/12/07  Subjective/Objective:  Pt admitted with acute upper GI bleed                  Action/Plan:  PTA from home with husband.  CM will continue to follow for discharge needs   Expected Discharge Date:                  Expected Discharge Plan:     In-House Referral:     Discharge planning Services  CM Consult  Post Acute Care Choice:    Choice offered to:     DME Arranged:    DME Agency:     HH Arranged:    HH Agency:     Status of Service:  In process, will continue to follow  If discussed at Long Length of Stay Meetings, dates discussed:    Additional Comments: 06/17/2016  Attending decided to trach pt today.  CM spoke with pts husband of options if pt doesn't wean from ventilator/supplemental oxygen including LTACH, SNF and home when she is medically stable .Husband was very pleasant and is open to all 3 options.  CSW consulted to follow for possible referral - CM will continue to follow for discharge needs  Possible trach in the near future Maryclare Labrador, RN 06/17/2016, 10:16 AM

## 2016-06-17 NOTE — Anesthesia Preprocedure Evaluation (Signed)
Anesthesia Evaluation  Patient identified by MRN, date of birth, ID band Patient unresponsive    Reviewed: Allergy & Precautions, NPO status , Patient's Chart, lab work & pertinent test results, Unable to perform ROS - Chart review only  Airway Mallampati: Intubated       Dental   Pulmonary asthma , sleep apnea , former smoker,    Pulmonary exam normal        Cardiovascular hypertension, Pt. on medications Normal cardiovascular exam     Neuro/Psych Depression    GI/Hepatic   Endo/Other    Renal/GU      Musculoskeletal   Abdominal   Peds  Hematology   Anesthesia Other Findings   Reproductive/Obstetrics                             Anesthesia Physical Anesthesia Plan  ASA: III  Anesthesia Plan: General   Post-op Pain Management:    Induction: Intravenous  Airway Management Planned: Oral ETT  Additional Equipment:   Intra-op Plan:   Post-operative Plan: Post-operative intubation/ventilation  Informed Consent: I have reviewed the patients History and Physical, chart, labs and discussed the procedure including the risks, benefits and alternatives for the proposed anesthesia with the patient or authorized representative who has indicated his/her understanding and acceptance.     Plan Discussed with: CRNA and Surgeon  Anesthesia Plan Comments:         Anesthesia Quick Evaluation

## 2016-06-17 NOTE — Consult Note (Signed)
Reason for Consult: Need for tracheostomy Referring Physician: Brand Males, MD  Tracey Nguyen is an 75 y.o. female.  HPI: History of GI bleed and multiorgan system failure, on ventilator. Attempted extubation was not successful. Concern about prolonged need for ventilatory support and need for tracheostomy.  Past Medical History:  Diagnosis Date  . Alpha thalassemia minor trait   . Arthritis   . Asthma   . Blood transfusion    after femur surgery  . Cancer [facility]) 2007   breast cancer  . Cataract    BILATERAL -REMOVED VIA SURGERY  . COLONIC POLYPS, HX OF 08/17/2007   Qualifier: Diagnosis of  By: Sherren Mocha MD, Jory Ee   . Complication of anesthesia    hard to wake up in 1970  . GERD (gastroesophageal reflux disease)   . Headache(784.0)    hx of migraines  . History of D&C   . Hypertension   . Osteoporosis    tx with boniva in the past  . Oxygen dependent   . Oxygen dependent   . Pneumonia    hx of  . S/P removal of and implantation of new IM nail, right hip 10/30/2012  . Scoliosis/kyphoscoliosis   . Sleep apnea    on cpap  . 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 BREAST BIOPSY  1995   left  . CATARACT EXTRACTION, BILATERAL  2006  . DILATION AND CURETTAGE OF UTERUS    . ESOPHAGOGASTRODUODENOSCOPY N/A 06/12/2016   Procedure: ESOPHAGOGASTRODUODENOSCOPY (EGD);  Surgeon: Wilford Corner, MD;  Location: [facility] ENDOSCOPY;  Service: Endoscopy;  Laterality: N/A;  . FEMUR FRACTURE SURGERY  2011   titanium rod/right  . FEMUR IM NAIL Right 10/30/2012  . hemorrhoidectomy  1987  . HIP SURGERY  10/30/12  . MASTECTOMY  2007   right    Family History  Problem Relation Age of Onset  . Dermatomyositis Mother   . Heart attack Father   . Tuberculosis Father   . Cancer Sister     Cervical ?   . Thalassemia Sister     Social History:  reports that she quit smoking about 51 years ago. Her smoking  use included Cigarettes. She has a 2.00 pack-year smoking history. She has never used smokeless tobacco. She reports that she does not drink alcohol or use drugs.  Allergies:  Allergies  Allergen Reactions  . Augmentin [Amoxicillin-Pot Clavulanate] Diarrhea  . Codeine Other (See Comments)    altered mental status    Medications: Reviewed  Results for orders placed or performed during the hospital encounter of 06/11/16 (from the past 48 hour(s))  Glucose, capillary     Status: Abnormal   Collection Time: 06/15/16  5:29 PM  Result Value Ref Range   Glucose-Capillary 118 (H) 65 - 99 mg/dL   Comment 1 Notify RN   Magnesium     Status: None   Collection Time: 06/16/16  2:26 AM  Result Value Ref Range   Magnesium 2.3 1.7 - 2.4 mg/dL  Phosphorus     Status: None   Collection Time: 06/16/16  2:26 AM  Result Value Ref Range   Phosphorus 2.5 2.5 - 4.6 mg/dL  Basic metabolic panel     Status: Abnormal   Collection Time: 06/16/16  7:11 AM  Result Value Ref Range   Sodium 145 135 - 145 mmol/L   Potassium 4.0 3.5 - 5.1 mmol/L   Chloride 110 101 - 111  mmol/L   CO2 26 22 - 32 mmol/L   Glucose, Bld 175 (H) 65 - 99 mg/dL   BUN 33 (H) 6 - 20 mg/dL   Creatinine, Ser 1.35 (H) 0.44 - 1.00 mg/dL   Calcium 9.1 8.9 - 10.3 mg/dL   GFR calc non Af Amer 37 (L) >60 mL/min   GFR calc Af Amer 43 (L) >60 mL/min    Comment: (NOTE) The eGFR has been calculated using the CKD EPI equation. This calculation has not been validated in all clinical situations. eGFR's persistently <60 mL/min signify possible Chronic Kidney Disease.    Anion gap 9 5 - 15  CBC with Differential/Platelet     Status: Abnormal   Collection Time: 06/16/16  7:11 AM  Result Value Ref Range   WBC 9.5 4.0 - 10.5 K/uL   RBC 3.09 (L) 3.87 - 5.11 MIL/uL   Hemoglobin 7.7 (L) 12.0 - 15.0 g/dL   HCT 24.4 (L) 36.0 - 46.0 %   MCV 79.0 78.0 - 100.0 fL   MCH 24.9 (L) 26.0 - 34.0 pg   MCHC 31.6 30.0 - 36.0 g/dL   RDW 23.1 (H) 11.5 - 15.5  %   Platelets 124 (L) 150 - 400 K/uL   Neutrophils Relative % 89 %   Lymphocytes Relative 7 %   Monocytes Relative 4 %   Eosinophils Relative 0 %   Basophils Relative 0 %   Neutro Abs 8.4 (H) 1.7 - 7.7 K/uL   Lymphs Abs 0.7 0.7 - 4.0 K/uL   Monocytes Absolute 0.4 0.1 - 1.0 K/uL   Eosinophils Absolute 0.0 0.0 - 0.7 K/uL   Basophils Absolute 0.0 0.0 - 0.1 K/uL   RBC Morphology POLYCHROMASIA PRESENT     Comment: TEARDROP CELLS  Glucose, capillary     Status: Abnormal   Collection Time: 06/16/16  8:03 AM  Result Value Ref Range   Glucose-Capillary 200 (H) 65 - 99 mg/dL   Comment 1 Repeat Test   Glucose, capillary     Status: Abnormal   Collection Time: 06/16/16 11:45 AM  Result Value Ref Range   Glucose-Capillary 166 (H) 65 - 99 mg/dL   Comment 1 Document in Chart   Basic metabolic panel     Status: Abnormal   Collection Time: 06/17/16  5:41 AM  Result Value Ref Range   Sodium 145 135 - 145 mmol/L   Potassium 3.8 3.5 - 5.1 mmol/L   Chloride 109 101 - 111 mmol/L   CO2 30 22 - 32 mmol/L   Glucose, Bld 169 (H) 65 - 99 mg/dL   BUN 44 (H) 6 - 20 mg/dL   Creatinine, Ser 1.38 (H) 0.44 - 1.00 mg/dL   Calcium 9.2 8.9 - 10.3 mg/dL   GFR calc non Af Amer 36 (L) >60 mL/min   GFR calc Af Amer 42 (L) >60 mL/min    Comment: (NOTE) The eGFR has been calculated using the CKD EPI equation. This calculation has not been validated in all clinical situations. eGFR's persistently <60 mL/min signify possible Chronic Kidney Disease.    Anion gap 6 5 - 15  CBC with Differential/Platelet     Status: Abnormal   Collection Time: 06/17/16  5:41 AM  Result Value Ref Range   WBC 10.7 (H) 4.0 - 10.5 K/uL   RBC 3.10 (L) 3.87 - 5.11 MIL/uL   Hemoglobin 7.6 (L) 12.0 - 15.0 g/dL   HCT 24.1 (L) 36.0 - 46.0 %   MCV 77.7 (L) 78.0 -  100.0 fL   MCH 24.5 (L) 26.0 - 34.0 pg   MCHC 31.5 30.0 - 36.0 g/dL   RDW 23.1 (H) 11.5 - 15.5 %   Platelets 153 150 - 400 K/uL   Neutrophils Relative % 88 %   Lymphocytes  Relative 5 %   Monocytes Relative 7 %   Eosinophils Relative 0 %   Basophils Relative 0 %   Neutro Abs 9.4 (H) 1.7 - 7.7 K/uL   Lymphs Abs 0.5 (L) 0.7 - 4.0 K/uL   Monocytes Absolute 0.8 0.1 - 1.0 K/uL   Eosinophils Absolute 0.0 0.0 - 0.7 K/uL   Basophils Absolute 0.0 0.0 - 0.1 K/uL   RBC Morphology POLYCHROMASIA PRESENT     Comment: RARE NRBCs MIXED RBC POPULATION   Magnesium     Status: None   Collection Time: 06/17/16  5:41 AM  Result Value Ref Range   Magnesium 2.2 1.7 - 2.4 mg/dL  Phosphorus     Status: None   Collection Time: 06/17/16  5:41 AM  Result Value Ref Range   Phosphorus 3.1 2.5 - 4.6 mg/dL    Dg Chest Port 1 View  Result Date: 06/17/2016 CLINICAL DATA:  Hypoxia.  Repositioned endotracheal tube EXAM: PORTABLE CHEST 1 VIEW COMPARISON:  Study obtained earlier in the day FINDINGS: Endotracheal tube tip is 1.4 cm above the carina. Nasogastric tube tip and side port below the diaphragm. No pneumothorax. There is a small pleural effusion on the left. There is no edema or consolidation. Heart is mildly enlarged with pulmonary vascularity within normal limits. There is mid thoracic dextroscoliosis. No evident adenopathy. IMPRESSION: Endotracheal tube tip is 1.4 cm above the carina. It may be prudent to consider withdrawing endotracheal tube approximately 2 cm. No edema or consolidation. Left pleural effusion. Stable cardiac silhouette. Electronically Signed   By: Lowella Grip III M.D.   On: 06/17/2016 10:17   Dg Chest Port 1 View  Result Date: 06/17/2016 CLINICAL DATA:  Endotracheal tube with ventilator support. EXAM: PORTABLE CHEST 1 VIEW COMPARISON:  06/16/2016 FINDINGS: Endotracheal tube is at the carina. Nasogastric tube enters the abdomen. There is persistent bilateral lower lobe volume loss and/or pneumonia. No change since yesterday. IMPRESSION: No change. Endotracheal tube at the carina. Persistent lower lobe atelectasis/ pneumonia. Electronically Signed   By: Nelson Chimes M.D.   On: 06/17/2016 07:46   Dg Chest Port 1 View  Result Date: 06/16/2016 CLINICAL DATA:  Endotracheal tube adjustment. EXAM: PORTABLE CHEST 1 VIEW COMPARISON:  Earlier same day. FINDINGS: Endotracheal tube tip is at the carina. Nasogastric tube enters the stomach. Atelectasis/ infiltrate in both lower lobes persists, possibly slightly worsened. IMPRESSION: Endotracheal tube tip at the carina. Persistent and possibly slightly worsened lower lobe volume loss. Electronically Signed   By: Nelson Chimes M.D.   On: 06/16/2016 11:29   Dg Chest Port 1 View  Result Date: 06/16/2016 CLINICAL DATA:  Check endotracheal tube placement EXAM: PORTABLE CHEST 1 VIEW COMPARISON:  06/15/2016 FINDINGS: Cardiac shadow is enlarged but stable. An endotracheal tube is again seen but has advanced slightly into the right mainstem bronchus. There is mild volume loss on the left but stable. Nasogastric catheter extends into the stomach. Some persistent infiltrative changes are noted in the mid and lower low right lung. No bony abnormality is seen. IMPRESSION: Interval advancement of the endotracheal tube into the right mainstem bronchus. This should be withdrawn 3-4 cm. The remainder of the exam is stable from the prior study. These results will  be called to the ordering clinician or representative by the Radiologist Assistant, and communication documented in the PACS or zVision Dashboard. Electronically Signed   By: Inez Catalina M.D.   On: 06/16/2016 08:34    AUQ:[id number] except as listed in admit H&P  Blood pressure 107/73, pulse 86, temperature 99.2 F (37.3 C), temperature source Oral, resp. rate 20, height 4' 10"  (1.473 m), weight 79.1 kg (174 lb 6.1 oz), SpO2 94 %.  PHYSICAL EXAM: Overall appearance:  Critically ill, orally intubated. Head:  Normocephalic, atraumatic. Ears: External ears look normal. Nose: External nose is healthy in appearance. Internal nasal exam free of any lesions or obstruction. Oral  Cavity/Pharynx:  Limited by endotracheal tube. Larynx/Hypopharynx: Deferred Neuro:  No identifiable neurologic deficits. Neck: No palpable neck masses. She does have a very thick fatty deposit in the anterior neck.  Studies Reviewed: none  Procedures: none   Assessment/Plan: If there is any concern that she will be intubated for any prolonged length of time, then I agree that tracheostomy would be the best option. I discussed at length with the husband that I can do it later today or early next week. He is going to talk to the son and talk to his wife and come up with a decision if that is what they want to do. Risks and benefits were discussed. All questions were answered.  Tracey Nguyen 06/17/2016, 12:04 PM

## 2016-06-17 NOTE — Procedures (Signed)
Pt placed back on full support due to increased WOB and inc RR 33, pt tolerating full support well, RT will monitor

## 2016-06-17 NOTE — Anesthesia Postprocedure Evaluation (Signed)
Anesthesia Post Note  Patient: Tracey Nguyen  Procedure(s) Performed: Procedure(s) (LRB): TRACHEOSTOMY (N/A)  Patient location during evaluation: SICU Anesthesia Type: General Level of consciousness: sedated Pain management: pain level controlled Vital Signs Assessment: post-procedure vital signs reviewed and stable Respiratory status: patient remains intubated per anesthesia plan Cardiovascular status: stable Anesthetic complications: no    Last Vitals:  Vitals:   06/17/16 1930 06/17/16 2000  BP:  100/61  Pulse:  74  Resp:  18  Temp: 37.3 C     Last Pain:  Vitals:   06/17/16 1930  TempSrc: Oral  PainSc:                  Tracey Nguyen DAVID

## 2016-06-17 NOTE — Progress Notes (Signed)
Called [place] [facility] and talked to Santiago Glad about her displaced fracture of the mid fifth metacarpal. She had a splint placed on 06/09/2016 in ED and saw Dr. Stann Mainland at [facility] orthopedics on 06/10/16. She was scheduled to see Dr. Stann Mainland today.

## 2016-06-17 NOTE — Progress Notes (Signed)
Talked to Dr. Stann Mainland, [facility] orth[facility]. He recommended getting an x-ray of the hand/left arm. He also suggested she should stay in splint while in ICU as the fluid shift could affect arm size. He will consider a cast when she is discharged.  X-ray of the left hand was ordered.

## 2016-06-17 NOTE — Op Note (Signed)
06/17/2016 9:30 PM   PATIENT:  Tracey Nguyen, 75 y.o. female  PRE-OPERATIVE DIAGNOSIS:  Respiratory failure  POST-OPERATIVE DIAGNOSIS:  Respiratory failure  PROCEDURE:  Procedure(s): TRACHEOSTOMY  SURGEON:  Surgeon(s): Beckie Salts, MD  ASSISTANTS: none   ANESTHESIA:   general  EBL: Minimal   DRAINS: none   LOCAL MEDICATIONS USED:  NONE  COUNTS CORRECT:  YES  PROCEDURE DETAILS: Patient was taken to the operating room and placed on the operating table in the supine position. The neck was placed in extension. The patient was previously orally intubated. The neck was prepped and draped in a standard fashion. A vertical incision was created just above the sternal notch using electrocautery. The midline fascia was divided. The isthmus of the thyroid was reflected superiorly and the upper trachea was exposed. A tracheotomy was created between the second and third tracheal rings in a horizontal fashion. A lower tracheal flap was created with scissors and the flap was sutured to the cervical skin using 2-0 chromic suture. The orotracheal tube was removed. The 7 mm inner diameter Bivona adjustable tracheostomy tube was placed without difficulty and the cuff was inflated. The shield was secured to the neck using a Velcro straps and nylon suture. The patient was then transferred back to the intensive care unit in critical condition.  PLAN OF CARE: Transfer to ICU  PATIENT DISPOSITION:  ICU - hemodynamically stable.

## 2016-06-17 NOTE — Progress Notes (Signed)
PULMONARY / CRITICAL CARE MEDICINE   Name: Tracey Nguyen MRN: [id number] DOB: [birth year]    ADMISSION DATE:  06/11/2016  REFERRING MD:  Dr. Jeneen Rinks, ER  CHIEF COMPLAINT:  Weak  brief 75 yo female started feeling weak over the past few days.  She was noted to be tachycardic at rehab.  She called pulmonary office and advised to go to ER for assessment.  She had mild anemia and there was concern for UTI.  She was recently switched from tylenol to [facility] for pain control. Her husband reports that on 11/02 she looked "white as a ghost".  She was also having black stool, but he thought his was from her antibiotic.  She became progressively weaker, and he brought her back to the ER.  She was found to have severe anemia.  She is being given PRBC, and reports symptomatic improvement with transfusion.  She also has been started on protonix gtt, and GI has been called by EDP.  She denies alcohol use or smoking.  She is not aware of stomach ulcers before.  She is not having chest pain or abdominal pain.  She does feel bloated, and reports last BM was on 11/03.  She has PMHx of Asthma, rhinitis, scoliosis with restrictive lung disease, OSA on Bipap, OHS on 2 liters oxygen, Breast cancer, HTN. Recently started using NSAIDs  IMAGING x48h Dg Chest Port 1 View  06/12/2016: Endotracheal tube at the level of the carina with some increased left basilar atelectasis. The tube should be withdrawn 1-2 cm.   06/14/16: Pulm edema-diuresis  06/15/16: Pulm edema improved. Pressure support  06/16/16: Pulm congestion.    SIGNIFICANT EVENTS: 11/04 Admit, GI consulted  06/12/16 repeat rounds - GI consult pending. No active bleed per RN. Normotensive got 2 U PRBC per RN hgb 3s to 7. Finishing her night CPAP Rx. Per RN has bad severe OSA (dr Dohmeier  AHI 87)  06/13/16 - Extubated to 3L by [place]  06/14/16 - reintubated due to increased work of breathing  06/15/16- SBT. No cuff leak on exam. Decadron by tube  06/16/16  -Back on full support due to increased agitation. Still no cuff leak. Getting diuresed for pulm congestion  Subjective:  Reports throat pain this morning again. On PRVC overnight. Denies chest pain or other problem  VITAL SIGNS: BP (!) 99/59   Pulse 67   Temp 98.7 F (37.1 C) (Oral)   Resp 15   Ht 4\' 10"  (1.473 m)   Wt 174 lb 6.1 oz (79.1 kg)   SpO2 99%   BMI 36.45 kg/m   HEMODYNAMICS:  Not on pressors  VENTILATOR SETTINGS: Vent Mode: PRVC FiO2 (%):  [40 %] 40 % Set Rate:  [15 bmp] 15 bmp Vt Set:  [350 mL] 350 mL PEEP:  [5 cmH20] 5 cmH20 Pressure Support:  [5 cmH20-10 cmH20] 10 cmH20 Plateau Pressure:  [21 cmH20-23 cmH20] 21 cmH20  INTAKE / OUTPUT: I/O last 3 completed shifts: In: 2005 [I.V.:400; NG/GT:1605] Out: 2535 [Urine:2335; Emesis/NG output:200]  PHYSICAL EXAMINATION: General: Obese  Neuro:  Alert, awake and answers questions by nodding her head.  Cardiovascular:  Regular, no murmur Lungs:  On PRVC 15/350/40/5 Abdomen:  Obese/ mild distention,  non tender Musculoskeletal:  No edema, Lt wrist in wrap Skin:  No rashes  LABS:  PULMONARY  Recent Labs Lab 06/12/16 0906 06/14/16 1254  PHART 7.412 7.300*  PCO2ART 37.5 46.4  PO2ART 118.0* 132*  HCO3 23.9 22.2  TCO2 25  --  O2SAT 99.0 98.8    CBC  Recent Labs Lab 06/15/16 0242 06/16/16 0711 06/17/16 0541  HGB 7.8* 7.7* 7.6*  HCT 24.9* 24.4* 24.1*  WBC 11.8* 9.5 10.7*  PLT 151 124* 153    COAGULATION  Recent Labs Lab 06/11/16 2053  INR 1.52    CARDIAC    Recent Labs Lab 06/12/16 0854 06/12/16 1520 06/12/16 2214  TROPONINI 0.06* 0.06* 0.07*   No results for input(s): PROBNP in the last 168 hours.   CHEMISTRY  Recent Labs Lab 06/13/16 0343 06/14/16 0243 06/14/16 0836 06/14/16 1954 06/15/16 0242 06/16/16 0226 06/16/16 0711 06/17/16 0541  NA 145  --  149* 149* 149*  --  145 145  K 3.6  --  4.0 3.5 3.2*  --  4.0 3.8  CL 115*  --  120* 115* 111  --  110 109  CO2 24   --  24 26 27   --  26 30  GLUCOSE 119*  --  108* 125* 112*  --  175* 169*  BUN 68*  --  31* 27* 24*  --  33* 44*  CREATININE 1.69*  --  1.44* 1.55* 1.53*  --  1.35* 1.38*  CALCIUM 8.0*  --  8.3* 8.7* 8.7*  --  9.1 9.2  MG 2.1 2.2  --  2.1 2.0 2.3  --   --   PHOS 2.6 2.5  --   --  2.5 2.5  --   --    Estimated Creatinine Clearance: 30.8 mL/min (by C-G formula based on SCr of 1.38 mg/dL (H)).   LIVER  Recent Labs Lab 06/11/16 2053  AST 12*  ALT 9*  ALKPHOS 37*  BILITOT 0.4  PROT 4.1*  ALBUMIN 2.4*  INR 1.52     INFECTIOUS  Recent Labs Lab 06/11/16 2112  LATICACIDVEN 1.36    ENDOCRINE CBG (last 3)   Recent Labs  06/15/16 1729 06/16/16 0803 06/16/16 1145  GLUCAP 118* 200* 166*    ASSESSMENT / PLAN: Resp A: Chronic hypoxic respiratory failure 2nd to OSA, OHS, scoliosis with restrictive lung disease, asthma. Acute preop resp failure = intubated for EGD>extubated to 3L by [place] on 11/5 Reintubated after IWOB on BiPAP on 11/6 No cuff leak on exam Back on PRVC due to increased agitation 11/8.  P: - Lasix IV 40 mg daily - Check cuff leak and SBT - brovana, pulmicort, duoneb - Decadron 4mg  q6h by tube 11/7>  CVS:  A: Hypovolemic shock 2nd to acute upper GIB at [facility] Troponin: 0.06>0.06>0.07 likely demand GIB stopped  P: - transfuse for Hb < 7 or bleeding - Protonix as below - neo for MAP > 65  GI A: GI bleed - GU bleed due to NSAIDS EGD with esophagitis and gastritis GIB stopped. Hgb stable A -Appreciate GI recs:  -daily CBC  -IV Protonix twice a day   -H. Pylori serology  -f/u with Dr. Henrene Pastor outpt   -Outpt colonoscopy  Hem: A: Anemia of GIB GIB stopped Hgb 9.2>3.4>3units>8.3>>7.7 Anemia panel: normal on 11/3 except for low normal ferritin P: Daily CBC PPI for GIB SCD for VTE  Renal A: CKD sCr 1.35 b/l ~1.4 Lactic acidosis resolved Contraction alkalosis after aggressive diuresis HypoK: resolved HyperNa:  resolved  P: -Replete as need -BMET, monitor urine outpt  Neuro: A: Anxiety  P: Klonopin 1 mg twice a day   Goals of care - full code  Family update:  Updated pt's husband at bedside 11/8.   Wendee Beavers, MD.  PGY-2, [place] [facility] 06/17/2016 6:47 AM   Attending:  I have seen and examined the patient with nurse practitioner/resident and agree with the note above.  We formulated the plan together and I elicited the following history.    Weaned this morning Diuresed some Minimal anxiety  On exam No cuff leak, rhonchi, vent supported breaths CV: RRR, no mgr Belly soft, nontneder  Acute respiratory failure with hypoxemia in setting of pulmonary edema and airway edema> continue decadron, diurese again today, if cuff leak then extubate assuming passing SBT; tracheostomy may be the better option, will ask partners to see GI bleed > stable, monitor for bleeding Nutrition> continue tube feedings Anxiety> continue clonazepam  My cc time 57  Husband updated by me  Roselie Awkward, MD [place] [facility] Pager: [phone] Cell: [phone] After 3pm or if no response, call [phone]

## 2016-06-18 ENCOUNTER — Encounter (HOSPITAL_COMMUNITY): Payer: Self-pay | Admitting: Otolaryngology

## 2016-06-18 ENCOUNTER — Inpatient Hospital Stay (HOSPITAL_COMMUNITY): Payer: Medicare Other

## 2016-06-18 ENCOUNTER — Inpatient Hospital Stay
Admission: AD | Admit: 2016-06-18 | Discharge: 2016-07-09 | Disposition: E | Payer: Medicare Other | Source: Ambulatory Visit | Attending: Internal Medicine | Admitting: Internal Medicine

## 2016-06-18 DIAGNOSIS — J9690 Respiratory failure, unspecified, unspecified whether with hypoxia or hypercapnia: Secondary | ICD-10-CM

## 2016-06-18 DIAGNOSIS — Z4659 Encounter for fitting and adjustment of other gastrointestinal appliance and device: Secondary | ICD-10-CM

## 2016-06-18 DIAGNOSIS — R0603 Acute respiratory distress: Secondary | ICD-10-CM

## 2016-06-18 DIAGNOSIS — Z452 Encounter for adjustment and management of vascular access device: Secondary | ICD-10-CM

## 2016-06-18 DIAGNOSIS — D649 Anemia, unspecified: Secondary | ICD-10-CM

## 2016-06-18 DIAGNOSIS — Z01818 Encounter for other preprocedural examination: Secondary | ICD-10-CM

## 2016-06-18 DIAGNOSIS — J189 Pneumonia, unspecified organism: Secondary | ICD-10-CM

## 2016-06-18 DIAGNOSIS — S66819A Strain of other specified muscles, fascia and tendons at wrist and hand level, unspecified hand, initial encounter: Secondary | ICD-10-CM

## 2016-06-18 LAB — BASIC METABOLIC PANEL
Anion gap: 10 (ref 5–15)
BUN: 43 mg/dL — ABNORMAL HIGH (ref 6–20)
CALCIUM: 9.4 mg/dL (ref 8.9–10.3)
CHLORIDE: 105 mmol/L (ref 101–111)
CO2: 32 mmol/L (ref 22–32)
CREATININE: 1.45 mg/dL — AB (ref 0.44–1.00)
GFR, EST AFRICAN AMERICAN: 39 mL/min — AB (ref >60)
GFR, EST NON AFRICAN AMERICAN: 34 mL/min — AB (ref >60)
Glucose, Bld: 125 mg/dL — ABNORMAL HIGH (ref 65–99)
Potassium: 3.9 mmol/L (ref 3.5–5.1)
SODIUM: 147 mmol/L — AB (ref 135–145)

## 2016-06-18 LAB — CBC
HCT: 25.6 % — ABNORMAL LOW (ref 36.0–46.0)
Hemoglobin: 7.9 g/dL — ABNORMAL LOW (ref 12.0–15.0)
MCH: 24.1 pg — ABNORMAL LOW (ref 26.0–34.0)
MCHC: 30.9 g/dL (ref 30.0–36.0)
MCV: 78 fL (ref 78.0–100.0)
PLATELETS: 165 10*3/uL (ref 150–400)
RBC: 3.28 MIL/uL — AB (ref 3.87–5.11)
RDW: 22.9 % — AB (ref 11.5–15.5)
WBC: 10 10*3/uL (ref 4.0–10.5)

## 2016-06-18 LAB — MAGNESIUM: MAGNESIUM: 2 mg/dL (ref 1.7–2.4)

## 2016-06-18 LAB — PHOSPHORUS: PHOSPHORUS: 3.6 mg/dL (ref 2.5–4.6)

## 2016-06-18 MED ORDER — ARFORMOTEROL TARTRATE 15 MCG/2ML IN NEBU: 15.0000 ug | INHALATION_SOLUTION | Freq: Two times a day (BID) | RESPIRATORY_TRACT | Status: AC

## 2016-06-18 MED ORDER — FUROSEMIDE 10 MG/ML IJ SOLN
20.0000 mg | Freq: Every day | INTRAMUSCULAR | Status: DC
  Administered 2016-06-18: 20 mg via INTRAVENOUS

## 2016-06-18 MED ORDER — DEXAMETHASONE 4 MG PO TABS
4.0000 mg | ORAL_TABLET | Freq: Four times a day (QID) | ORAL | Status: DC
  Filled 2016-06-18 (×2): qty 1

## 2016-06-18 MED ORDER — VITAL HIGH PROTEIN PO LIQD: 1000.0000 mL | ORAL | Status: AC

## 2016-06-18 MED ORDER — CLONAZEPAM 1 MG PO TABS: 1.0000 mg | ORAL_TABLET | Freq: Two times a day (BID) | ORAL | 0 refills | Status: AC

## 2016-06-18 MED ORDER — DEXAMETHASONE 4 MG PO TABS: 4.0000 mg | ORAL_TABLET | Freq: Four times a day (QID) | ORAL | Status: AC

## 2016-06-18 MED ORDER — FREE WATER: 200.0000 mL | Freq: Three times a day (TID) | Status: DC

## 2016-06-18 MED ORDER — ORAL CARE MOUTH RINSE: 15.0000 mL | Freq: Four times a day (QID) | OROMUCOSAL | 0 refills | Status: AC

## 2016-06-18 MED ORDER — POLYETHYLENE GLYCOL 3350 17 G PO PACK
17.0000 g | PACK | Freq: Every day | ORAL | Status: DC
  Administered 2016-06-18: 17 g via ORAL
  Filled 2016-06-18: qty 1

## 2016-06-18 MED ORDER — FREE WATER: 300.0000 mL | Status: AC

## 2016-06-18 MED ORDER — MIDAZOLAM HCL 2 MG/2ML IJ SOLN: 1.0000 mg | INTRAMUSCULAR | 0 refills | Status: AC | PRN

## 2016-06-18 MED ORDER — IPRATROPIUM-ALBUTEROL 0.5-2.5 (3) MG/3ML IN SOLN: 3.0000 mL | RESPIRATORY_TRACT | Status: AC | PRN

## 2016-06-18 MED ORDER — FREE WATER: 300.0000 mL | Status: DC

## 2016-06-18 MED ORDER — SODIUM CHLORIDE 0.9 % IV SOLN: 10.0000 mL | INTRAVENOUS | 0 refills | Status: AC

## 2016-06-18 MED ORDER — FUROSEMIDE 10 MG/ML IJ SOLN: 20.0000 mg | Freq: Every day | INTRAMUSCULAR | 0 refills | Status: AC

## 2016-06-18 MED ORDER — CHLORHEXIDINE GLUCONATE 0.12% ORAL RINSE (MEDLINE KIT): 15.0000 mL | Freq: Two times a day (BID) | OROMUCOSAL | 0 refills | Status: AC

## 2016-06-18 MED ORDER — FENTANYL CITRATE (PF) 100 MCG/2ML IJ SOLN: 25.0000 ug | INTRAMUSCULAR | 0 refills | Status: AC | PRN

## 2016-06-18 MED ORDER — PANTOPRAZOLE SODIUM 40 MG IV SOLR: 40.0000 mg | Freq: Two times a day (BID) | INTRAVENOUS | Status: AC

## 2016-06-18 NOTE — Discharge Summary (Signed)
Physician Discharge Summary       Patient ID: Tracey Nguyen MRN: [id number] DOB/AGE: [birth year] 75 y.o.  Admit date: 06/11/2016 Discharge date: 07/08/2016  Discharge Diagnoses:  Active Problems:   Upper GI bleed   Shock circulatory (HCC)   Acute respiratory failure with hypoxia (HCC)   Symptomatic anemia   Encounter for intubation   History of Present Illness: 75 yo female started feeling weak over the past few days.  She was noted to be tachycardic at rehab.  She called pulmonary office and advised to go to ER for assessment.  She had mild anemia and there was concern for UTI.  She was recently switched from tylenol to [facility] for pain control. Her husband reports that on 11/02 she looked "white as a ghost".  She was also having black stool, but he thought his was from her antibiotic.  She became progressively weaker, and he brought her back to the ER.  She was found to have severe anemia.  She is being given PRBC, and reports symptomatic improvement with transfusion.  She also has been started on protonix gtt, and GI has been called by EDP.  She denies alcohol use or smoking.  She is not aware of stomach ulcers before.  She is not having chest pain or abdominal pain.  She does feel bloated, and reports last BM was on 11/03.  Hospital Course:   Respiratory: History of chronic hypoxic respiratory failure 2/2 to OSA, OHS, scoliosis with restrictive lung disease, and asthma. Patient intubated on 11/4 for safety during endoscopy. She was extubated to [place] on 11/5. However, due to increased work of breathing on BiPAP on 11/6 patient was re-intubated. Tracheostomy was performed on 11/9.  Attempting trach collar trials at time of discharge.   GI: Patient admitted with GI bleed likely 2/2 to NSAID use. Patient was given 3 units of PRBC. EGD performed on 11/4 which showed esophagitis and gastritis. Two non bleeding gastric ulcers were seen. H pylori was negative.  No further bleeding occurred  during hospitalization and HgB was stable. Outpatient follow up is planned.   Cardiovascular: Patient presented with hypovolemic shock secondary to acute upper GIB. Troponin did rise to 0.07 thought to be secondary to demand ischemia. Patient was hemodynamically stable at time of discharge. Given lasix for pulmonary edema.         Discharge Plan by active problems  GI bleed, resolved with gastritis and esophagitis: Plan for outpatient follow up with Dr. Henrene Pastor and outpatient colonoscopy.  Acute on Chronic Respiratory Failure: Give Decadron through 11/11. Continue to wean off trach.  Pulmonary Edema: Echo ordered.      Significant Hospital tests/ studies  EGD 11/4: Normal esophagus. Non-bleeding gastric ulcer with pigmented material. Acute gastritis. Non-bleeding gastric ulcer with no stigmata of bleeding. A few gastric polyps. Multiple non-bleeding duodenal ulcers with no stigmata of bleeding.Acute duodenitis.Normal second portion of the duodenum.   Consults: Gastroenterology   Discharge Exam: BP 107/73   Pulse 85   Temp 99.5 F (37.5 C) (Oral)   Resp 17   Ht _0  (1.473 m)   Wt 174 lb 6.1 oz (79.1 kg)   SpO2 97%   BMI 36.45 kg/m   General: Obese  Neuro:  Alert, awake and answers questions by nodding her head.  Cardiovascular:  Regular, no , no edema Lungs:  On PRVC via trach, inspiratory and expiratory wheeze Abdomen:  Obese/ mild distention,  non tender Musculoskeletal:  No edema, Lt wrist in wrap Skin:  No rashes  Labs at discharge Lab Results  Component Value Date   CREATININE 1.45 (H) 07/04/2016   BUN 43 (H) 06/23/2016   NA 147 (H) 06/29/2016   K 3.9 06/28/2016   CL 105 07/04/2016   CO2 32 07/08/2016   Lab Results  Component Value Date   WBC 10.0 07/03/2016   HGB 7.9 (L) 06/12/2016   HCT 25.6 (L) 06/14/2016   MCV 78.0 07/01/2016   PLT 165 06/22/2016   Lab Results  Component Value Date   ALT 9 (L) 06/11/2016   AST 12 (L) 06/11/2016   ALKPHOS 37 (L)  06/11/2016   BILITOT 0.4 06/11/2016   Lab Results  Component Value Date   INR 1.52 06/11/2016   INR 1.15 10/23/2012   INR 1.13 01/16/2010    Current radiology studies Dg Chest Port 1 View  Result Date: 06/21/2016 CLINICAL DATA:  Intubation. EXAM: PORTABLE CHEST 1 VIEW COMPARISON:  06/17/2016 . FINDINGS: Tracheostomy tube noted with tip projected over the mid trachea. NG tube noted with tip below left hemidiaphragm. Cardiomegaly. Mild pulmonary venous congestion improved from prior exam. Low lung volumes with mild bibasilar atelectasis. Small left pleural effusion cannot be excluded. No pneumothorax. IMPRESSION: 1. Tracheostomy tube noted with tip projected over the mid trachea. NG tube noted with tip below left hemidiaphragm. 2. Cardiomegaly. Mild pulmonary venous congestion improved from prior exam. 3. Low lung volumes with mild bibasilar atelectasis. Small left pleural effusion. Electronically Signed   By: Marcello Moores  Register   On: 07/08/2016 07:37   Dg Chest Port 1 View  Result Date: 06/17/2016 CLINICAL DATA:  Hypoxia.  Repositioned endotracheal tube EXAM: PORTABLE CHEST 1 VIEW COMPARISON:  Study obtained earlier in the day FINDINGS: Endotracheal tube tip is 1.4 cm above the carina. Nasogastric tube tip and side port below the diaphragm. No pneumothorax. There is a small pleural effusion on the left. There is no edema or consolidation. Heart is mildly enlarged with pulmonary vascularity within normal limits. There is mid thoracic dextroscoliosis. No evident adenopathy. IMPRESSION: Endotracheal tube tip is 1.4 cm above the carina. It may be prudent to consider withdrawing endotracheal tube approximately 2 cm. No edema or consolidation. Left pleural effusion. Stable cardiac silhouette. Electronically Signed   By: Lowella Grip III M.D.   On: 06/17/2016 10:17   Dg Chest Port 1 View  Result Date: 06/17/2016 CLINICAL DATA:  Endotracheal tube with ventilator support. EXAM: PORTABLE CHEST 1 VIEW  COMPARISON:  06/16/2016 FINDINGS: Endotracheal tube is at the carina. Nasogastric tube enters the abdomen. There is persistent bilateral lower lobe volume loss and/or pneumonia. No change since yesterday. IMPRESSION: No change. Endotracheal tube at the carina. Persistent lower lobe atelectasis/ pneumonia. Electronically Signed   By: Nelson Chimes M.D.   On: 06/17/2016 07:46   Dg Abd Portable 1v  Result Date: 07/07/2016 CLINICAL DATA:  Feeding tube placement EXAM: PORTABLE ABDOMEN - 1 VIEW COMPARISON:  07/02/2016 FINDINGS: There is normal small bowel gas pattern. There is NG feeding tube with tip in proximal jejunum. IMPRESSION: NG feeding tube in place with tip in proximal jejunum. Electronically Signed   By: Lahoma Crocker M.D.   On: 07/05/2016 14:51   Dg Abd Portable 1v  Result Date: 06/28/2016 CLINICAL DATA:  Encounter for feeding tube placement. EXAM: PORTABLE ABDOMEN - 1 VIEW COMPARISON:  06/14/2016 FINDINGS: Enteric feeding tube passes through the stomach. Tip projects in the left upper quadrant consistent with positioning distal to the ligament of Treitz. IMPRESSION: Well-positioned enteric feeding tube.  Electronically Signed   By: Lajean Manes M.D.   On: 06/17/2016 10:07   Dg Hand Complete Left  Result Date: 06/17/2016 CLINICAL DATA:  Follow-up of left hand fracture. EXAM: LEFT HAND - COMPLETE 3+ VIEW COMPARISON:  06/09/2016 FINDINGS: Images obtained within a cast. Fracture of the fifth metacarpal bone is poorly characterized due to the cast. The alignment of the fifth metacarpal bone appears to be improved and more anatomic than the previous examination. There continues to be a small amount of dorsal displacement on the lateral view. Marked degenerative changes at the first carpometacarpal joint. IMPRESSION: Fifth metacarpal fracture is poorly characterized on this examination. The alignment of the fifth metacarpal fracture has improved since the previous examination but there may be residual  dorsal displacement. Electronically Signed   By: Markus Daft M.D.   On: 06/17/2016 14:49    Disposition:  01-Home or Self Care  Discharge Instructions    Diet - low sodium heart healthy    Complete by:  As directed    Increase activity slowly    Complete by:  As directed        Medication List    STOP taking these medications   ALIGN 4 MG Caps   DELSYM 30 MG/5ML liquid Generic drug:  dextromethorphan   fluticasone 50 MCG/ACT nasal spray Commonly known as:  FLONASE   furosemide 40 MG tablet Commonly known as:  LASIX Replaced by:  furosemide 10 MG/ML injection   Ipratropium-Albuterol 20-100 MCG/ACT Aers respimat Commonly known as:  COMBIVENT RESPIMAT Replaced by:  ipratropium-albuterol 0.5-2.5 (3) MG/3ML Soln   losartan 50 MG tablet Commonly known as:  COZAAR   PRESERVISION AREDS PO   vitamin B-12 100 MCG tablet Commonly known as:  CYANOCOBALAMIN     TAKE these medications   arformoterol 15 MCG/2ML Nebu Commonly known as:  BROVANA Take 2 mLs (15 mcg total) by nebulization 2 (two) times daily.   budesonide-formoterol 160-4.5 MCG/ACT inhaler Commonly known as:  SYMBICORT Inhale 2 puffs into the lungs 2 (two) times daily.   chlorhexidine gluconate (MEDLINE KIT) 0.12 % solution Commonly known as:  PERIDEX 15 mLs by Mouth Rinse route 2 (two) times daily.   cholecalciferol 1000 units tablet Commonly known as:  VITAMIN D Take 1,000 Units by mouth daily.   clonazePAM 1 MG tablet Commonly known as:  KLONOPIN Take 1 tablet (1 mg total) by mouth 2 (two) times daily.   dexamethasone 4 MG tablet Commonly known as:  DECADRON Place 1 tablet (4 mg total) into feeding tube every 6 (six) hours.   DULoxetine 30 MG capsule Commonly known as:  CYMBALTA TAKE 1 CAPSULE BY MOUTH ONCE DAILY   feeding supplement (VITAL HIGH PROTEIN) Liqd liquid Place 1,000 mLs into feeding tube continuous.   fentaNYL 100 MCG/2ML injection Commonly known as:  SUBLIMAZE Inject 0.5-1 mLs  (25-50 mcg total) into the vein every 2 (two) hours as needed for moderate pain or severe pain.   free water Soln Place 300 mLs into feeding tube every 4 (four) hours.   furosemide 10 MG/ML injection Commonly known as:  LASIX Inject 2 mLs (20 mg total) into the vein daily. Start taking on:  06/19/2016 Replaces:  furosemide 40 MG tablet   ipratropium-albuterol 0.5-2.5 (3) MG/3ML Soln Commonly known as:  DUONEB Take 3 mLs by nebulization every 2 (two) hours as needed. Replaces:  Ipratropium-Albuterol 20-100 MCG/ACT Aers respimat   midazolam 2 MG/2ML Soln injection Commonly known as:  VERSED Inject 1 mL (1  mg total) into the vein every 2 (two) hours as needed for agitation (to maintain RASS goal.).   mouth rinse Liqd solution 15 mLs by Mouth Rinse route QID.   pantoprazole 40 MG injection Commonly known as:  PROTONIX Inject 40 mg into the vein every 12 (twelve) hours.   polyethylene glycol packet Commonly known as:  MIRALAX / GLYCOLAX Take 17 g by mouth as needed for mild constipation.   pramipexole 1 MG tablet Commonly known as:  MIRAPEX TAKE 1 & 1/2 TABLETS BY MOUTH ONCE EVERY EVENING   sodium chloride 0.9 % infusion Inject 10 mLs into the vein continuous.        Discharged Condition: stable  Greater than 35 minutes of time have been dedicated to discharge assessment, planning and discharge instructions.    Phill Myron, D.O. 06/28/2016, 3:25 PM PGY-2, [place]

## 2016-06-18 NOTE — Progress Notes (Signed)
SLP Cancellation Note  Patient Details Name: Tracey Nguyen MRN: [id number] DOB: [birth year]   Cancelled treatment:       Reason Eval/Treat Not Completed: Patient not medically ready. Orders received for swallow evaluation. Per chart, pt received trach yesterday and remains on full vent support. Will continue to follow for readiness. Please consider PMV evaluation when ready to optimize swallowing function.   Germain Osgood 06/21/2016, 8:45 AM   Germain Osgood, M.A. CCC-SLP [phone]

## 2016-06-18 NOTE — Progress Notes (Signed)
Attempted to place NG tube. Unable to pass NG tube. NG tube coiled in mouth and then would not pass. Another RN attempted and unable to pass NG either. MD made aware and order placed for Cortrak tube.

## 2016-06-18 NOTE — Significant Event (Addendum)
Called to place small bored feeding tube on patient this morning at 0845am.   Cortrak feeding tube placed via left nare-marked at 95cm. Patient tolerated procedure. KUB ordered. May use feeding tube once tip is confirmed with KUB. Please save tube and stylus if replacement is needed. Thank you.

## 2016-06-18 NOTE — Significant Event (Signed)
Was called back to replace a cortrak feeding tube as patient had removed it. RN replaced via left nare-marked at 90cm. Patient tolerated procedure. KUB ordered.

## 2016-06-18 NOTE — Progress Notes (Signed)
Pt placed back on full vent support at this time due to increased WOB, will try ATC again later today as tolerated

## 2016-06-18 NOTE — Progress Notes (Signed)
Pt has trach. Taken off vent to trach collar today. Spoke w dr Titus Mould not sure if will need ltac or not. Dr Titus Mould req ltac eval in case needed. Spoke w pt and husband. He prefers [facility] if needed. Have asked corina w [facility] to do elidg if needed. Cm will cont to follow.

## 2016-06-18 NOTE — Progress Notes (Addendum)
PULMONARY / CRITICAL CARE MEDICINE   Name: Tracey Nguyen MRN: [id number] DOB: November 03, 1940    ADMISSION DATE:  06/11/2016  REFERRING MD:  Dr. Jeneen Rinks, ER  CHIEF COMPLAINT:  Weak  brief 75 yo female started feeling weak over the past few days.  She was noted to be tachycardic at rehab.  She called pulmonary office and advised to go to ER for assessment.  She had mild anemia and there was concern for UTI.  She was recently switched from tylenol to [facility] for pain control. Her husband reports that on 11/02 she looked "white as a ghost".  She was also having black stool, but he thought his was from her antibiotic.  She became progressively weaker, and he brought her back to the ER.  She was found to have severe anemia.  She is being given PRBC, and reports symptomatic improvement with transfusion.  She also has been started on protonix gtt, and GI has been called by EDP.  She denies alcohol use or smoking.  She is not aware of stomach ulcers before.  She is not having chest pain or abdominal pain.  She does feel bloated, and reports last BM was on 11/03.  She has PMHx of Asthma, rhinitis, scoliosis with restrictive lung disease, OSA on Bipap, OHS on 2 liters oxygen, Breast cancer, HTN. Recently started using NSAIDs  IMAGING x48h Dg Chest Port 1 View  06/12/2016: Endotracheal tube at the level of the carina with some increased left basilar atelectasis. The tube should be withdrawn 1-2 cm.   06/14/16: Pulm edema-diuresis  06/15/16: Pulm edema improved. Pressure support  06/16/16: Pulm congestion.    SIGNIFICANT EVENTS: 11/04 Admit, GI consulted  06/12/16 repeat rounds - GI consult pending. No active bleed per RN. Normotensive got 2 U PRBC per RN hgb 3s to 7. Finishing her night CPAP Rx. Per RN has bad severe OSA (dr Dohmeier  AHI 87)  06/13/16 - Extubated to 3L by [place]  06/14/16 - reintubated due to increased work of breathing  06/15/16- SBT. No cuff leak on exam. Decadron by tube  06/16/16-  Back on full support due to increased agitation. Still no cuff leak. Getting diuresed for pulm congestion  06/17/16-eby ENT  Subjective:  No complaint this morning. She had successful tracheostomy yesterday. On PRVC overnight. Denies chest pain or other problem  VITAL SIGNS: BP (!) 82/54   Pulse (!) 56   Temp 99.3 F (37.4 C) (Oral)   Resp (!) 4   Ht 4\' 10"  (1.473 m)   Wt 79.1 kg (174 lb 6.1 oz)   SpO2 98%   BMI 36.45 kg/m   HEMODYNAMICS:  Not on pressors  VENTILATOR SETTINGS: Vent Mode: PRVC FiO2 (%):  [40 %] 40 % Set Rate:  [15 bmp-350 bmp] 18 bmp Vt Set:  [350 mL] 350 mL PEEP:  [5 cmH20] 5 cmH20 Pressure Support:  [5 cmH20] 5 cmH20 Plateau Pressure:  [17 cmH20-25 cmH20] 20 cmH20  INTAKE / OUTPUT: I/O last 3 completed shifts: In: 1815.8 [I.V.:360; NG/GT:1455.8] Out: 4880 [Urine:4880]  PHYSICAL EXAMINATION: General: Obese  Neuro:  Alert, awake and answers questions by nodding her head.  Cardiovascular:  Regular, no , no edema Lungs:  On PRVC via trach, inspiratory and expiratory wheeze Abdomen:  Obese/ mild distention,  non tender Musculoskeletal:  No edema, Lt wrist in wrap Skin:  No rashes  LABS:  PULMONARY  Recent Labs Lab 06/12/16 0906 06/14/16 1254  PHART 7.412 7.300*  PCO2ART 37.5 46.4  PO2ART  118.0* 132*  HCO3 23.9 22.2  TCO2 25  --   O2SAT 99.0 98.8    CBC  Recent Labs Lab 06/16/16 0711 06/17/16 0541 07/05/2016 0247  HGB 7.7* 7.6* 7.9*  HCT 24.4* 24.1* 25.6*  WBC 9.5 10.7* 10.0  PLT 124* 153 165    COAGULATION  Recent Labs Lab 06/11/16 2053  INR 1.52    CARDIAC    Recent Labs Lab 06/12/16 0854 06/12/16 1520 06/12/16 2214  TROPONINI 0.06* 0.06* 0.07*   No results for input(s): PROBNP in the last 168 hours.   CHEMISTRY  Recent Labs Lab 06/14/16 0243  06/14/16 1954 06/15/16 0242 06/16/16 0226 06/16/16 0711 06/17/16 0541 07/05/2016 0247  NA  --   < > 149* 149*  --  145 145 147*  K  --   < > 3.5 3.2*  --  4.0  3.8 3.9  CL  --   < > 115* 111  --  110 109 105  CO2  --   < > 26 27  --  26 30 32  GLUCOSE  --   < > 125* 112*  --  175* 169* 125*  BUN  --   < > 27* 24*  --  33* 44* 43*  CREATININE  --   < > 1.55* 1.53*  --  1.35* 1.38* 1.45*  CALCIUM  --   < > 8.7* 8.7*  --  9.1 9.2 9.4  MG 2.2  --  2.1 2.0 2.3  --  2.2 2.0  PHOS 2.5  --   --  2.5 2.5  --  3.1 3.6  < > = values in this interval not displayed. Estimated Creatinine Clearance: 29.3 mL/min (by C-G formula based on SCr of 1.45 mg/dL (H)).   LIVER  Recent Labs Lab 06/11/16 2053  AST 12*  ALT 9*  ALKPHOS 37*  BILITOT 0.4  PROT 4.1*  ALBUMIN 2.4*  INR 1.52     INFECTIOUS  Recent Labs Lab 06/11/16 2112  LATICACIDVEN 1.36    ENDOCRINE CBG (last 3)   Recent Labs  06/15/16 1729 06/16/16 0803 06/16/16 1145  GLUCAP 118* 200* 166*    ASSESSMENT / PLAN: Resp A: Chronic hypoxic respiratory failure 2nd to OSA, OHS, scoliosis with restrictive lung disease, asthma. Acute preop resp failure = intubated for EGD>extubated to 3L by [place] on 11/5 Reintubated after IWOB on BiPAP on 11/6 and could come of full support Tracheostomy 11/9 P: - Lasix IV 20 mg daily, goal 1 liter neg - Trach collar attempt - brovana, pulmicort, duoneb - Decadron 4mg  q6h by tube 11/7>allow to dc after 4 doses  CVS:  A: Hypovolemic shock 2nd to acute upper GIB at [facility] Troponin: 0.06>0.06>0.07 likely demand GIB stopped  P: - transfuse for Hb < 7 or bleeding - Protonix as below -echo assessment for pulm edema, last echo noted  GI A: GI bleed - GU bleed due to NSAIDS EGD with esophagitis and gastritis H. Pylori serology negative GIB stopped. Hgb stable A -Appreciate GI recs:  -daily CBC  -IV Protonix twice a day   -f/u with Dr. Henrene Pastor outpt   -Outpt colonoscopy -[facility] for feeding tube use only if fails stomach   Hem: A: Anemia of GIB GIB stopped Hgb 9.2>3.4>3units>8.3>>7.9 Anemia panel: normal on 11/3 except for low  normal ferritin P: Daily CBC PPI for GIB SCD for VTE  Renal A: CKD sCr 1.35 b/l ~1.4 Lactic acidosis resolved HypoK: resolved Mild HyperNa Mild Contraction alkalosis after diuresis  P: -Replete as need -BMET, monitor urine outpt -Free water increase   Neuro: A: Anxiety  P: Klonopin 1 mg twice a day  Prn fent  MSK:  A:  Displaced fracture of the mid fifth metacarpal bone s/p splint on 11/1 [place] ortho (Dr. Stann Mainland aware). Repeat X-ray with improved alignment. P: Continue splint while inpt per Dr. Stann Mainland  Goals of care - full code  Family update:  Updated pt's husband at bedside 11/9.   Wendee Beavers, MD.  PGY-2, [place] [facility] 06/26/2016 6:45 AM   STAFF NOTE: Linwood Dibbles, MD FACP have personally reviewed patient's available data, including medical history, events of note, physical examination and test results as part of my evaluation. I have discussed with resident/NP and other care providers such as pharmacist, RN and RRT. In addition, I personally evaluated patient and elicited key findings of: aweks fc well, obese, trach clean, lungs clear, decadron x 4 does then dc, appreciate ENT trach so fast, wean TC as goal, reduce lasix, increase free water, glu controlled, had extensive pulm edema, last echo 2014, repeat today, PPI to continue, add TF through gastric NGT if fails then cortak, needs aq BM, add mir, increase free water, may need d5w, may need lantus, to sdu vent transfer The patient is critically ill with multiple organ systems failure and requires high complexity decision making for assessment and support, frequent evaluation and titration of therapies, application of advanced monitoring technologies and extensive interpretation of multiple databases.   Critical Care Time devoted to patient care services described in this note is 35 Minutes. This time reflects time of care of this signee: Merrie Roof, MD FACP. This critical care time does  not reflect procedure time, or teaching time or supervisory time of PA/NP/Med student/Med Resident etc but could involve care discussion time. Rest per NP/medical resident whose note is outlined above and that I agree with   Lavon Paganini. Titus Mould, MD, [place] Pgr: [facility] 07/04/2016 8:52 AM

## 2016-06-18 NOTE — Progress Notes (Signed)
Report called to RN on [facility]. Pt to be transferred via bed. No personal belongings at bedside. Husband called and made aware of transfer. He informed RN that he would be up to see pt around 1700-1730.

## 2016-06-19 ENCOUNTER — Institutional Professional Consult (permissible substitution) (HOSPITAL_COMMUNITY): Payer: Self-pay

## 2016-06-20 LAB — COMPREHENSIVE METABOLIC PANEL
ALBUMIN: 3.1 g/dL — AB (ref 3.5–5.0)
ALK PHOS: 63 U/L (ref 38–126)
ALT: 32 U/L (ref 14–54)
AST: 19 U/L (ref 15–41)
Anion gap: 10 (ref 5–15)
BILIRUBIN TOTAL: 0.7 mg/dL (ref 0.3–1.2)
BUN: 38 mg/dL — AB (ref 6–20)
CALCIUM: 9.5 mg/dL (ref 8.9–10.3)
CO2: 34 mmol/L — AB (ref 22–32)
CREATININE: 1.37 mg/dL — AB (ref 0.44–1.00)
Chloride: 101 mmol/L (ref 101–111)
GFR calc Af Amer: 42 mL/min — ABNORMAL LOW (ref >60)
GFR calc non Af Amer: 36 mL/min — ABNORMAL LOW (ref >60)
GLUCOSE: 212 mg/dL — AB (ref 65–99)
Potassium: 3.9 mmol/L (ref 3.5–5.1)
SODIUM: 145 mmol/L (ref 135–145)
Total Protein: 6.1 g/dL — ABNORMAL LOW (ref 6.5–8.1)

## 2016-06-20 LAB — CBC
HEMATOCRIT: 31.7 % — AB (ref 36.0–46.0)
Hemoglobin: 9.6 g/dL — ABNORMAL LOW (ref 12.0–15.0)
MCH: 23.8 pg — AB (ref 26.0–34.0)
MCHC: 30.3 g/dL (ref 30.0–36.0)
MCV: 78.7 fL (ref 78.0–100.0)
Platelets: 221 10*3/uL (ref 150–400)
RBC: 4.03 MIL/uL (ref 3.87–5.11)
RDW: 23.1 % — AB (ref 11.5–15.5)
WBC: 13.7 10*3/uL — AB (ref 4.0–10.5)

## 2016-06-20 LAB — MAGNESIUM: Magnesium: 2.3 mg/dL (ref 1.7–2.4)

## 2016-06-20 LAB — PHOSPHORUS: Phosphorus: 3.9 mg/dL (ref 2.5–4.6)

## 2016-06-21 ENCOUNTER — Other Ambulatory Visit (HOSPITAL_COMMUNITY): Payer: Self-pay

## 2016-06-21 DIAGNOSIS — R0902 Hypoxemia: Secondary | ICD-10-CM | POA: Diagnosis not present

## 2016-06-21 DIAGNOSIS — J9601 Acute respiratory failure with hypoxia: Secondary | ICD-10-CM

## 2016-06-21 DIAGNOSIS — Z930 Tracheostomy status: Secondary | ICD-10-CM | POA: Diagnosis not present

## 2016-06-21 DIAGNOSIS — J384 Edema of larynx: Secondary | ICD-10-CM | POA: Diagnosis not present
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
## 2016-06-22 ENCOUNTER — Other Ambulatory Visit (HOSPITAL_COMMUNITY): Payer: Self-pay

## 2016-06-24 ENCOUNTER — Institutional Professional Consult (permissible substitution) (HOSPITAL_COMMUNITY): Payer: Self-pay

## 2016-06-24 ENCOUNTER — Other Ambulatory Visit (HOSPITAL_COMMUNITY): Payer: Self-pay

## 2016-06-24 LAB — BLOOD GAS, ARTERIAL
Acid-base deficit: 8.4 mmol/L — ABNORMAL HIGH (ref 0.0–2.0)
BICARBONATE: 16.8 mmol/L — AB (ref 20.0–28.0)
FIO2: 35
LHR: 18 {breaths}/min
O2 Saturation: 93.5 %
PATIENT TEMPERATURE: 98.6
PCO2 ART: 35.2 mmHg (ref 32.0–48.0)
PEEP: 5 cmH2O
PO2 ART: 83.3 mmHg (ref 83.0–108.0)
VT: 350 mL
pH, Arterial: 7.3 — ABNORMAL LOW (ref 7.350–7.450)

## 2016-06-24 LAB — CBC
HCT: 36.9 % (ref 36.0–46.0)
HEMATOCRIT: 35.5 % — AB (ref 36.0–46.0)
HEMOGLOBIN: 11.3 g/dL — AB (ref 12.0–15.0)
Hemoglobin: 11.2 g/dL — ABNORMAL LOW (ref 12.0–15.0)
MCH: 23.9 pg — AB (ref 26.0–34.0)
MCH: 23.5 pg — AB (ref 26.0–34.0)
MCHC: 31.5 g/dL (ref 30.0–36.0)
MCHC: 30.6 g/dL (ref 30.0–36.0)
MCV: 75.9 fL — AB (ref 78.0–100.0)
MCV: 76.9 fL — ABNORMAL LOW (ref 78.0–100.0)
PLATELETS: 479 10*3/uL — AB (ref 150–400)
PLATELETS: 433 10*3/uL — AB (ref 150–400)
RBC: 4.68 MIL/uL (ref 3.87–5.11)
RBC: 4.8 MIL/uL (ref 3.87–5.11)
RDW: 23.7 % — AB (ref 11.5–15.5)
RDW: 23.8 % — AB (ref 11.5–15.5)
WBC: 46.3 10*3/uL — AB (ref 4.0–10.5)
WBC: 39.8 10*3/uL — ABNORMAL HIGH (ref 4.0–10.5)

## 2016-06-24 LAB — BASIC METABOLIC PANEL
Anion gap: 17 — ABNORMAL HIGH (ref 5–15)
Anion gap: 24 — ABNORMAL HIGH (ref 5–15)
BUN: 94 mg/dL — AB (ref 6–20)
BUN: 103 mg/dL — AB (ref 6–20)
CALCIUM: 8.8 mg/dL — AB (ref 8.9–10.3)
CHLORIDE: 93 mmol/L — AB (ref 101–111)
CO2: 29 mmol/L (ref 22–32)
CO2: 19 mmol/L — ABNORMAL LOW (ref 22–32)
CREATININE: 3.73 mg/dL — AB (ref 0.44–1.00)
CREATININE: 4.75 mg/dL — AB (ref 0.44–1.00)
Calcium: 9.1 mg/dL (ref 8.9–10.3)
Chloride: 95 mmol/L — ABNORMAL LOW (ref 101–111)
GFR calc Af Amer: 13 mL/min — ABNORMAL LOW (ref >60)
GFR calc non Af Amer: 11 mL/min — ABNORMAL LOW (ref >60)
GFR, EST AFRICAN AMERICAN: 9 mL/min — AB (ref >60)
GFR, EST NON AFRICAN AMERICAN: 8 mL/min — AB (ref >60)
GLUCOSE: 473 mg/dL — AB (ref 65–99)
Glucose, Bld: 580 mg/dL (ref 65–99)
Potassium: 6.2 mmol/L — ABNORMAL HIGH (ref 3.5–5.1)
Potassium: 6.4 mmol/L (ref 3.5–5.1)
SODIUM: 139 mmol/L (ref 135–145)
SODIUM: 138 mmol/L (ref 135–145)

## 2016-06-24 LAB — URINALYSIS, ROUTINE W REFLEX MICROSCOPIC
Glucose, UA: NEGATIVE mg/dL
KETONES UR: 15 mg/dL — AB
NITRITE: POSITIVE — AB
Protein, ur: >300 mg/dL — AB
SPECIFIC GRAVITY, URINE: 1.016 (ref 1.005–1.030)
pH: 8 (ref 5.0–8.0)

## 2016-06-24 LAB — URINE MICROSCOPIC-ADD ON

## 2016-06-24 LAB — HEMOGLOBIN AND HEMATOCRIT, BLOOD
HEMATOCRIT: 28.5 % — AB (ref 36.0–46.0)
HEMOGLOBIN: 8.5 g/dL — AB (ref 12.0–15.0)

## 2016-06-24 LAB — PREPARE RBC (CROSSMATCH)

## 2016-06-24 MED FILL — Medication: Qty: 1 | Status: AC

## 2016-06-25 LAB — BLOOD CULTURE ID PANEL (REFLEXED)
Acinetobacter baumannii: NOT DETECTED
CANDIDA GLABRATA: NOT DETECTED
CANDIDA KRUSEI: NOT DETECTED
CANDIDA PARAPSILOSIS: NOT DETECTED
CARBAPENEM RESISTANCE: NOT DETECTED
Candida albicans: NOT DETECTED
Candida tropicalis: NOT DETECTED
ESCHERICHIA COLI: NOT DETECTED
Enterobacter cloacae complex: NOT DETECTED
Enterobacteriaceae species: DETECTED — AB
Enterococcus species: NOT DETECTED
Haemophilus influenzae: NOT DETECTED
KLEBSIELLA OXYTOCA: NOT DETECTED
KLEBSIELLA PNEUMONIAE: NOT DETECTED
Listeria monocytogenes: NOT DETECTED
Neisseria meningitidis: NOT DETECTED
PSEUDOMONAS AERUGINOSA: NOT DETECTED
Proteus species: DETECTED — AB
SERRATIA MARCESCENS: NOT DETECTED
STAPHYLOCOCCUS AUREUS BCID: NOT DETECTED
STAPHYLOCOCCUS SPECIES: NOT DETECTED
STREPTOCOCCUS PNEUMONIAE: NOT DETECTED
STREPTOCOCCUS PYOGENES: NOT DETECTED
Streptococcus agalactiae: NOT DETECTED
Streptococcus species: NOT DETECTED

## 2016-06-25 LAB — CULTURE, RESPIRATORY W GRAM STAIN

## 2016-06-25 LAB — URINE CULTURE

## 2016-06-25 LAB — CULTURE, RESPIRATORY

## 2016-06-27 LAB — CULTURE, BLOOD (ROUTINE X 2)

## 2016-06-28 LAB — TYPE AND SCREEN
ABO/RH(D): O POS
Antibody Screen: NEGATIVE
UNIT DIVISION: 0
UNIT DIVISION: 0
Unit division: 0
Unit division: 0

## 2016-07-06 ENCOUNTER — Ambulatory Visit: Payer: Medicare Other | Admitting: Pulmonary Disease

## 2016-07-09 DEATH — deceased

## 2016-09-08 ENCOUNTER — Ambulatory Visit: Payer: Medicare Other | Admitting: Neurology
